# Patient Record
Sex: Male | Born: 1935 | Race: White | Hispanic: No | Smoking: Never smoker
Health system: Southern US, Community
[De-identification: ages and names within clinical notes are randomized; demographics above are authoritative.]

## PROBLEM LIST (undated history)

## (undated) DIAGNOSIS — I119 Hypertensive heart disease without heart failure: Secondary | ICD-10-CM

## (undated) DIAGNOSIS — I252 Old myocardial infarction: Secondary | ICD-10-CM

## (undated) DIAGNOSIS — C61 Malignant neoplasm of prostate: Secondary | ICD-10-CM

## (undated) DIAGNOSIS — I639 Cerebral infarction, unspecified: Secondary | ICD-10-CM

## (undated) DIAGNOSIS — E785 Hyperlipidemia, unspecified: Secondary | ICD-10-CM

## (undated) DIAGNOSIS — I440 Atrioventricular block, first degree: Secondary | ICD-10-CM

## (undated) DIAGNOSIS — I480 Paroxysmal atrial fibrillation: Secondary | ICD-10-CM

## (undated) DIAGNOSIS — M109 Gout, unspecified: Secondary | ICD-10-CM

## (undated) DIAGNOSIS — N183 Chronic kidney disease, stage 3 unspecified: Secondary | ICD-10-CM

## (undated) DIAGNOSIS — R609 Edema, unspecified: Secondary | ICD-10-CM

## (undated) DIAGNOSIS — I2510 Atherosclerotic heart disease of native coronary artery without angina pectoris: Secondary | ICD-10-CM

## (undated) DIAGNOSIS — I10 Essential (primary) hypertension: Secondary | ICD-10-CM

## (undated) DIAGNOSIS — I674 Hypertensive encephalopathy: Secondary | ICD-10-CM

## (undated) DIAGNOSIS — E1165 Type 2 diabetes mellitus with hyperglycemia: Principal | ICD-10-CM

## (undated) DIAGNOSIS — IMO0001 Reserved for inherently not codable concepts without codable children: Secondary | ICD-10-CM

## (undated) DIAGNOSIS — H9319 Tinnitus, unspecified ear: Secondary | ICD-10-CM

## (undated) HISTORY — DX: Old myocardial infarction: I25.2

## (undated) HISTORY — DX: Reserved for inherently not codable concepts without codable children: IMO0001

## (undated) HISTORY — DX: Malignant neoplasm of prostate: C61

## (undated) HISTORY — DX: Type 2 diabetes mellitus with hyperglycemia: E11.65

## (undated) HISTORY — DX: Atrioventricular block, first degree: I44.0

## (undated) HISTORY — DX: Edema, unspecified: R60.9

## (undated) HISTORY — PX: PROSTATE SURGERY: SHX751

## (undated) HISTORY — PX: APPENDECTOMY: SHX54

## (undated) HISTORY — DX: Atherosclerotic heart disease of native coronary artery without angina pectoris: I25.10

## (undated) HISTORY — DX: Hyperlipidemia, unspecified: E78.5

## (undated) HISTORY — DX: Hypertensive encephalopathy: I67.4

## (undated) HISTORY — DX: Essential (primary) hypertension: I10

## (undated) HISTORY — DX: Hypertensive heart disease without heart failure: I11.9

## (undated) HISTORY — DX: Chronic kidney disease, stage 3 (moderate): N18.3

## (undated) HISTORY — DX: Paroxysmal atrial fibrillation: I48.0

## (undated) HISTORY — DX: Chronic kidney disease, stage 3 unspecified: N18.30

## (undated) HISTORY — DX: Tinnitus, unspecified ear: H93.19

## (undated) HISTORY — DX: Gout, unspecified: M10.9

## (undated) HISTORY — DX: Cerebral infarction, unspecified: I63.9

---

## 1993-01-18 HISTORY — PX: CORONARY ARTERY BYPASS GRAFT: SHX141

## 1997-09-25 ENCOUNTER — Emergency Department (HOSPITAL_COMMUNITY): Admission: EM | Admit: 1997-09-25 | Discharge: 1997-09-25 | Payer: Self-pay | Admitting: Emergency Medicine

## 1997-09-25 ENCOUNTER — Encounter: Payer: Self-pay | Admitting: Emergency Medicine

## 1997-09-28 ENCOUNTER — Emergency Department (HOSPITAL_COMMUNITY): Admission: EM | Admit: 1997-09-28 | Discharge: 1997-09-28 | Payer: Self-pay | Admitting: Emergency Medicine

## 2000-05-24 ENCOUNTER — Other Ambulatory Visit: Admission: RE | Admit: 2000-05-24 | Discharge: 2000-05-24 | Payer: Self-pay | Admitting: Urology

## 2000-06-08 ENCOUNTER — Other Ambulatory Visit: Admission: RE | Admit: 2000-06-08 | Discharge: 2000-06-08 | Payer: Self-pay | Admitting: Urology

## 2001-11-08 ENCOUNTER — Encounter: Admission: RE | Admit: 2001-11-08 | Discharge: 2002-02-06 | Payer: Self-pay | Admitting: Internal Medicine

## 2004-04-14 ENCOUNTER — Ambulatory Visit (HOSPITAL_COMMUNITY): Admission: RE | Admit: 2004-04-14 | Discharge: 2004-04-14 | Payer: Self-pay | Admitting: *Deleted

## 2006-02-03 ENCOUNTER — Inpatient Hospital Stay (HOSPITAL_COMMUNITY): Admission: EM | Admit: 2006-02-03 | Discharge: 2006-02-04 | Payer: Self-pay | Admitting: Emergency Medicine

## 2006-02-03 ENCOUNTER — Ambulatory Visit (HOSPITAL_COMMUNITY): Admission: RE | Admit: 2006-02-03 | Discharge: 2006-02-03 | Payer: Self-pay | Admitting: Internal Medicine

## 2011-03-01 DIAGNOSIS — E785 Hyperlipidemia, unspecified: Secondary | ICD-10-CM | POA: Diagnosis not present

## 2011-05-24 DIAGNOSIS — I10 Essential (primary) hypertension: Secondary | ICD-10-CM | POA: Diagnosis not present

## 2011-05-24 DIAGNOSIS — E785 Hyperlipidemia, unspecified: Secondary | ICD-10-CM | POA: Diagnosis not present

## 2011-09-27 DIAGNOSIS — E1149 Type 2 diabetes mellitus with other diabetic neurological complication: Secondary | ICD-10-CM | POA: Diagnosis not present

## 2011-09-27 DIAGNOSIS — R609 Edema, unspecified: Secondary | ICD-10-CM | POA: Diagnosis not present

## 2011-09-27 DIAGNOSIS — I10 Essential (primary) hypertension: Secondary | ICD-10-CM | POA: Diagnosis not present

## 2011-09-27 DIAGNOSIS — I2510 Atherosclerotic heart disease of native coronary artery without angina pectoris: Secondary | ICD-10-CM | POA: Diagnosis not present

## 2012-01-19 HISTORY — PX: EYE SURGERY: SHX253

## 2012-01-24 DIAGNOSIS — Z23 Encounter for immunization: Secondary | ICD-10-CM | POA: Diagnosis not present

## 2012-01-28 DIAGNOSIS — E785 Hyperlipidemia, unspecified: Secondary | ICD-10-CM | POA: Diagnosis not present

## 2012-01-28 DIAGNOSIS — M109 Gout, unspecified: Secondary | ICD-10-CM | POA: Diagnosis not present

## 2012-01-28 DIAGNOSIS — I10 Essential (primary) hypertension: Secondary | ICD-10-CM | POA: Diagnosis not present

## 2012-02-01 DIAGNOSIS — E785 Hyperlipidemia, unspecified: Secondary | ICD-10-CM | POA: Diagnosis not present

## 2012-02-01 DIAGNOSIS — I10 Essential (primary) hypertension: Secondary | ICD-10-CM | POA: Diagnosis not present

## 2012-02-01 DIAGNOSIS — M109 Gout, unspecified: Secondary | ICD-10-CM | POA: Diagnosis not present

## 2012-02-10 ENCOUNTER — Encounter: Payer: Self-pay | Admitting: Geriatric Medicine

## 2012-02-10 DIAGNOSIS — I440 Atrioventricular block, first degree: Secondary | ICD-10-CM

## 2012-02-10 DIAGNOSIS — H9319 Tinnitus, unspecified ear: Secondary | ICD-10-CM

## 2012-02-10 DIAGNOSIS — E119 Type 2 diabetes mellitus without complications: Secondary | ICD-10-CM

## 2012-02-10 DIAGNOSIS — I10 Essential (primary) hypertension: Secondary | ICD-10-CM

## 2012-02-10 DIAGNOSIS — I252 Old myocardial infarction: Secondary | ICD-10-CM

## 2012-02-10 DIAGNOSIS — H908 Mixed conductive and sensorineural hearing loss, unspecified: Secondary | ICD-10-CM

## 2012-02-10 DIAGNOSIS — I25812 Atherosclerosis of bypass graft of coronary artery of transplanted heart without angina pectoris: Secondary | ICD-10-CM

## 2012-02-10 DIAGNOSIS — E1165 Type 2 diabetes mellitus with hyperglycemia: Secondary | ICD-10-CM

## 2012-02-10 DIAGNOSIS — R011 Cardiac murmur, unspecified: Secondary | ICD-10-CM

## 2012-02-10 DIAGNOSIS — E785 Hyperlipidemia, unspecified: Secondary | ICD-10-CM

## 2012-02-10 DIAGNOSIS — M109 Gout, unspecified: Secondary | ICD-10-CM

## 2012-02-10 DIAGNOSIS — C61 Malignant neoplasm of prostate: Secondary | ICD-10-CM

## 2012-02-10 DIAGNOSIS — I2510 Atherosclerotic heart disease of native coronary artery without angina pectoris: Secondary | ICD-10-CM

## 2012-02-11 ENCOUNTER — Encounter: Payer: Self-pay | Admitting: Geriatric Medicine

## 2012-04-03 DIAGNOSIS — H02009 Unspecified entropion of unspecified eye, unspecified eyelid: Secondary | ICD-10-CM | POA: Diagnosis not present

## 2012-04-03 DIAGNOSIS — E119 Type 2 diabetes mellitus without complications: Secondary | ICD-10-CM | POA: Diagnosis not present

## 2012-04-03 DIAGNOSIS — H179 Unspecified corneal scar and opacity: Secondary | ICD-10-CM | POA: Diagnosis not present

## 2012-04-03 DIAGNOSIS — Z961 Presence of intraocular lens: Secondary | ICD-10-CM | POA: Diagnosis not present

## 2012-04-07 DIAGNOSIS — H02009 Unspecified entropion of unspecified eye, unspecified eyelid: Secondary | ICD-10-CM | POA: Diagnosis not present

## 2012-04-11 ENCOUNTER — Ambulatory Visit: Payer: Self-pay | Admitting: Internal Medicine

## 2012-04-12 ENCOUNTER — Other Ambulatory Visit: Payer: Self-pay | Admitting: Internal Medicine

## 2012-04-24 ENCOUNTER — Telehealth: Payer: Self-pay | Admitting: Nurse Practitioner

## 2012-04-26 ENCOUNTER — Encounter: Payer: Self-pay | Admitting: Internal Medicine

## 2012-04-26 ENCOUNTER — Ambulatory Visit (INDEPENDENT_AMBULATORY_CARE_PROVIDER_SITE_OTHER): Payer: Medicare Other | Admitting: Internal Medicine

## 2012-04-26 VITALS — BP 126/72 | HR 52 | Temp 98.2°F | Resp 15 | Ht 73.0 in | Wt 222.6 lb

## 2012-04-26 DIAGNOSIS — E119 Type 2 diabetes mellitus without complications: Secondary | ICD-10-CM | POA: Diagnosis not present

## 2012-04-26 DIAGNOSIS — E785 Hyperlipidemia, unspecified: Secondary | ICD-10-CM

## 2012-04-26 DIAGNOSIS — I10 Essential (primary) hypertension: Secondary | ICD-10-CM | POA: Diagnosis not present

## 2012-04-26 MED ORDER — INSULIN ASPART 100 UNIT/ML ~~LOC~~ SOLN: 8.0000 [IU] | Freq: Two times a day (BID) | SUBCUTANEOUS | Status: DC

## 2012-04-26 NOTE — Patient Instructions (Addendum)
Check blood sugar before breakfast, before lunch and before dinner until next office visit. Give novolog before lunch and dinner 8 units only if blood sugar is more than 150. Get your blood test done 1 week prior to next office visit which is in 6 weeks Please bring your blood pressure machine on next offcie visit

## 2012-04-27 ENCOUNTER — Other Ambulatory Visit: Payer: Self-pay | Admitting: *Deleted

## 2012-04-27 NOTE — Progress Notes (Signed)
  Subjective:    Patient ID: John Romero, male    DOB: April 15, 1930, 79 y.o.   MRN: [id number]  HPI  Reviewed home blood pressure SBP 140-165 and DBP 69-86. Home cbg 64-80 in am. No recent gout attack or flare up. No premeal sugar monitor. Reviewed home medication. Denies any complaints  Review of Systems  Constitutional: Negative for fever, chills, activity change and appetite change.  HENT: Negative for congestion.   Eyes: Negative for visual disturbance.  Respiratory: Negative for shortness of breath.   Cardiovascular: Negative for chest pain, palpitations and leg swelling.  Gastrointestinal: Negative for abdominal pain.  Endocrine: Negative for polydipsia, polyphagia and polyuria.  Genitourinary: Negative for dysuria.  Musculoskeletal: Negative for arthralgias.  Neurological: Negative for dizziness and weakness.  Hematological: Negative for adenopathy.  Psychiatric/Behavioral: Negative for behavioral problems and agitation.       Objective:   Physical Exam  Constitutional: He is oriented to person, place, and time. He appears well-developed. No distress.  HENT:  Head: Normocephalic and atraumatic.  Mouth/Throat: Oropharynx is clear and moist.  Eyes: Pupils are equal, round, and reactive to light.  Neck: Normal range of motion. Neck supple. No thyromegaly present.  Cardiovascular: Normal rate and regular rhythm.   Pulmonary/Chest: Effort normal and breath sounds normal.  Abdominal: Soft. Bowel sounds are normal.  Musculoskeletal: Normal range of motion. He exhibits no edema and no tenderness.  Neurological: He is alert and oriented to person, place, and time.  Skin: Skin is warm and dry. He is not diaphoretic.  Psychiatric: He has a normal mood and affect.  restless   BP 126/72  Pulse 52  Temp(Src) 98.2 F (36.8 C) (Oral)  Resp 15  Ht 6\' 1"  (1.854 m)  Wt 222 lb 9.6 oz (100.971 kg)  BMI 29.37 kg/m2       Assessment & Plan:   Hypertension- bp remains stable,  continue current bp medication and monitor blood pressure. Check bmp  Diabetes mellitus- will need to check a1c. Pt checking blood sugar only once at home. Will change this to three times a day before meal and decrease novolog to 8 u pre lunch and dinner for cbg > 150 only and continue levemir 27 u daily for now. To bring cbg log next visit. Continue asa, ACEI and metformin with statin  Gout- no recent flare up. Continue current regimen of allopurinol  Hyperlipidemia- continue simvastatin and check flp prior to next visit  Walking for exercise encouraged.

## 2012-05-18 ENCOUNTER — Other Ambulatory Visit: Payer: Medicare Other

## 2012-05-18 DIAGNOSIS — E119 Type 2 diabetes mellitus without complications: Secondary | ICD-10-CM

## 2012-05-18 DIAGNOSIS — I10 Essential (primary) hypertension: Secondary | ICD-10-CM | POA: Diagnosis not present

## 2012-05-19 LAB — HEMOGLOBIN A1C
Est. average glucose Bld gHb Est-mCnc: 169 mg/dL
Hgb A1c MFr Bld: 7.5 % — ABNORMAL HIGH (ref 4.8–5.6)

## 2012-05-19 LAB — BASIC METABOLIC PANEL
CO2: 25 mmol/L (ref 19–28)
Calcium: 9.8 mg/dL (ref 8.6–10.2)
Creatinine, Ser: 1.27 mg/dL (ref 0.76–1.27)
GFR calc non Af Amer: 53 mL/min/{1.73_m2} — ABNORMAL LOW (ref >59)
Glucose: 94 mg/dL (ref 65–99)
Potassium: 3.7 mmol/L (ref 3.5–5.2)

## 2012-05-22 ENCOUNTER — Other Ambulatory Visit: Payer: Medicare Other

## 2012-05-30 ENCOUNTER — Encounter: Payer: Self-pay | Admitting: *Deleted

## 2012-05-31 ENCOUNTER — Ambulatory Visit (INDEPENDENT_AMBULATORY_CARE_PROVIDER_SITE_OTHER): Payer: Medicare Other | Admitting: Internal Medicine

## 2012-05-31 ENCOUNTER — Encounter: Payer: Self-pay | Admitting: Internal Medicine

## 2012-05-31 VITALS — BP 144/82 | HR 51 | Temp 97.4°F | Resp 14 | Ht >= 80 in | Wt 215.4 lb

## 2012-05-31 DIAGNOSIS — I10 Essential (primary) hypertension: Secondary | ICD-10-CM | POA: Diagnosis not present

## 2012-05-31 DIAGNOSIS — E119 Type 2 diabetes mellitus without complications: Secondary | ICD-10-CM | POA: Diagnosis not present

## 2012-05-31 DIAGNOSIS — M109 Gout, unspecified: Secondary | ICD-10-CM | POA: Insufficient documentation

## 2012-05-31 DIAGNOSIS — E785 Hyperlipidemia, unspecified: Secondary | ICD-10-CM | POA: Diagnosis not present

## 2012-05-31 NOTE — Progress Notes (Signed)
  Subjective:    Patient ID: John Romero, male    DOB: 1930/10/20, 79 y.o.   MRN: [id number]  HPI  Patient here with his wife. His labs were reviewed and discussed with patient. Reviewed home cbg reading 80-223. Has not needed to use novolog recently with premeal sugar being less than 150. His bp machine was checked bp reading at home sbp 124-147/ 77-87. bp on arrival to office 144/82. Does not exercise with limitations from his knee pain. Appetite is good. Mostly eats from outside. 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  HENT: Negative for congestion and mouth sores.   Eyes: Negative for visual disturbance.  Respiratory: Negative for shortness of breath.   Cardiovascular: Negative for chest pain.  Gastrointestinal: Negative for abdominal pain and abdominal distention.  Endocrine: Negative for cold intolerance.  Genitourinary: Negative for dysuria.  Musculoskeletal: Positive for arthralgias and gait problem. Negative for joint swelling.  Neurological: Negative for dizziness, syncope and weakness.  Psychiatric/Behavioral: Negative for confusion, dysphoric mood and agitation.       Objective:   Physical Exam  Constitutional: He is oriented to person, place, and time. He appears well-developed and well-nourished. No distress.  HENT:  Head: Normocephalic and atraumatic.  Mouth/Throat: Oropharynx is clear and moist.  Eyes: Pupils are equal, round, and reactive to light.  Neck: Normal range of motion. Neck supple.  Cardiovascular: Normal rate and regular rhythm.   Pulmonary/Chest: Effort normal and breath sounds normal.  Abdominal: Soft.  Musculoskeletal: Normal range of motion.  Flexion and extension limited at knee joint, limping gait on walking, no assistive device used  Neurological: He is alert and oriented to person, place, and time.  Skin: Skin is warm and dry. He is not diaphoretic.   Psychiatric: He has a normal mood and affect.   BP 144/82  Pulse 51  Temp(Src) 97.4 F (36.3 C) (Oral)  Resp 14  Ht 6\' 11"  (2.108 m)  Wt 215 lb 6.4 oz (97.705 kg)  BMI 21.99 kg/m2  bp repeat in office 128/67 and same reading with his home bp machine      Assessment & Plan:   HTN- bp well controlled. Continue current regimen. Check bmp prior to next viist  CKD stage 3- likely from long standing htn and dm. Avoid nsaid. Keep bp and dm under control  Dm- reviewed recent a1c 7.5 and home cbg readings. Continue current regimen. Monitor cbg Check a1c and urine microalbumin prior to next visit. Continue acei and statin. counselled about dietary modifications. Pt understands risks and benefits but does not want any dietary changes.  Gout- no recent flare up, continue allopurinol

## 2012-08-31 ENCOUNTER — Other Ambulatory Visit (HOSPITAL_BASED_OUTPATIENT_CLINIC_OR_DEPARTMENT_OTHER): Payer: Self-pay | Admitting: Internal Medicine

## 2012-09-14 ENCOUNTER — Other Ambulatory Visit: Payer: Self-pay | Admitting: Internal Medicine

## 2012-10-25 ENCOUNTER — Ambulatory Visit: Payer: Medicare Other | Admitting: Internal Medicine

## 2012-10-26 DIAGNOSIS — Z23 Encounter for immunization: Secondary | ICD-10-CM | POA: Diagnosis not present

## 2012-10-28 ENCOUNTER — Other Ambulatory Visit (HOSPITAL_BASED_OUTPATIENT_CLINIC_OR_DEPARTMENT_OTHER): Payer: Self-pay | Admitting: Internal Medicine

## 2012-11-07 ENCOUNTER — Encounter: Payer: Self-pay | Admitting: Internal Medicine

## 2012-11-07 ENCOUNTER — Ambulatory Visit (INDEPENDENT_AMBULATORY_CARE_PROVIDER_SITE_OTHER): Payer: Medicare Other | Admitting: Internal Medicine

## 2012-11-07 ENCOUNTER — Ambulatory Visit: Payer: Medicare Other | Admitting: Internal Medicine

## 2012-11-07 VITALS — BP 128/74 | HR 64 | Ht 71.0 in | Wt 208.0 lb

## 2012-11-07 DIAGNOSIS — R21 Rash and other nonspecific skin eruption: Secondary | ICD-10-CM

## 2012-11-07 DIAGNOSIS — N189 Chronic kidney disease, unspecified: Secondary | ICD-10-CM

## 2012-11-07 DIAGNOSIS — E119 Type 2 diabetes mellitus without complications: Secondary | ICD-10-CM | POA: Diagnosis not present

## 2012-11-07 DIAGNOSIS — N183 Chronic kidney disease, stage 3 unspecified: Secondary | ICD-10-CM

## 2012-11-07 DIAGNOSIS — I129 Hypertensive chronic kidney disease with stage 1 through stage 4 chronic kidney disease, or unspecified chronic kidney disease: Secondary | ICD-10-CM

## 2012-11-07 DIAGNOSIS — M109 Gout, unspecified: Secondary | ICD-10-CM | POA: Diagnosis not present

## 2012-11-07 DIAGNOSIS — E785 Hyperlipidemia, unspecified: Secondary | ICD-10-CM | POA: Diagnosis not present

## 2012-11-07 MED ORDER — NYSTATIN 100000 UNIT/GM EX CREA: 1.0000 "application " | TOPICAL_CREAM | Freq: Two times a day (BID) | CUTANEOUS | Status: DC

## 2012-11-07 MED ORDER — INSULIN ASPART 100 UNIT/ML ~~LOC~~ SOLN: 8.0000 [IU] | Freq: Two times a day (BID) | SUBCUTANEOUS | Status: DC

## 2012-11-07 MED ORDER — METOPROLOL TARTRATE 50 MG PO TABS: ORAL_TABLET | ORAL | Status: DC

## 2012-11-07 NOTE — Patient Instructions (Signed)
Take your aspart 8 units twice a day for sugar more than 200 only. Once your blood test results are out, we will call you

## 2012-11-07 NOTE — Progress Notes (Signed)
Patient ID: John Romero, male   DOB: Jan 09, 1931, 79 y.o.   MRN: [id number]  Chief Complaint  Patient presents with  . Medical Managment of Chronic Issues    Follow-up    No Known Allergies  HPI Patient here for his routine visit. He has been checking his sugar at ome and readings have been mostly below 200. He has not done his blood work prior to the visit. He denies any complaints. He mentions taking his medication. When asked about routine stuffs, he is tearful and mentions that his wife passed away in October 27, 2022. i had seen her with the patient last visit with his wife. His labs were reviewed and discussed with patient. Reviewed home cbg reading 80-223. Has not needed to use novolog recently with premeal sugar being less than 150. Appetite is good. Mostly eats from outside.   Review of Systems  Constitutional: Negative for fever, appetite change and fatigue.  HENT: Negative for congestion and mouth sores.   Eyes: Negative for visual disturbance.  Respiratory: Negative for shortness of breath.   Cardiovascular: Negative for chest pain.  Gastrointestinal: Negative for abdominal pain and abdominal distention.  Endocrine: Negative for cold intolerance.  Genitourinary: Negative for dysuria.  Musculoskeletal: Positive for arthralgias and gait problem. Negative for joint swelling.  Neurological: Negative for dizziness, syncope and weakness.  Psychiatric/Behavioral: Negative for confusion, dysphoric mood and agitation. Tearful this visit  Reviewed PMH, PSH and medication list.   Physical exam BP 128/74  Pulse 64  Ht 5\' 11"  (1.803 m)  Wt 208 lb (94.348 kg)  BMI 29.02 kg/m2  Constitutional: He is oriented to person, place, and time. He appears well-developed and well-nourished. No distress.  HENT:   Head: Normocephalic and atraumatic.   Mouth/Throat: Oropharynx is clear and moist.  Eyes: Pupils are equal, round, and reactive to light.  Neck: Normal range of motion. Neck supple.   Cardiovascular: Normal rate and regular rhythm.   Pulmonary/Chest: Effort normal and breath sounds normal.  Abdominal: Soft.  Musculoskeletal: Normal range of motion.  Flexion and extension limited at knee joint, limping gait on walking, no assistive device used  Neurological: He is alert and oriented to person, place, and time.  Skin: Skin is warm and dry. He is not diaphoretic. Has erythematous rash with white scale on the crease of his buttock. No drainage or skin tear Psychiatric: He has flat affect, tearful  Assessment/plan  HTN- bp well controlled. Continue current regimen. Check bmp today  Dm- reviewed last a1c 7.5 and home cbg readings. Continue current regimen of levemir, novolog and metformin. Monitor cbg. Check a1c and urine microalbumin today. Continue acei and statin. counselled about dietary modifications.   Rash- in the buttock area. Concern for candidal rash given the white coating an dhis dm. Will have him on nystatin cream for now and reassess  Hyperlipidemia- continue zocor for now. Monitor lipid  Gout- no recent flare up, continue allopurinol and check uric acid level  CKD stage 3- likely from long standing htn and dm. Avoid nsaid. Keep bp and dm under control

## 2012-11-08 LAB — COMPREHENSIVE METABOLIC PANEL
Albumin: 4.8 g/dL — ABNORMAL HIGH (ref 3.5–4.7)
BUN: 20 mg/dL (ref 8–27)
Chloride: 96 mmol/L — ABNORMAL LOW (ref 97–108)
Creatinine, Ser: 1.18 mg/dL (ref 0.76–1.27)
GFR calc Af Amer: 67 mL/min/{1.73_m2} (ref >59)
GFR calc non Af Amer: 58 mL/min/{1.73_m2} — ABNORMAL LOW (ref >59)
Globulin, Total: 2 g/dL (ref 1.5–4.5)
Glucose: 135 mg/dL — ABNORMAL HIGH (ref 65–99)
Total Bilirubin: 0.4 mg/dL (ref 0.0–1.2)
Total Protein: 6.8 g/dL (ref 6.0–8.5)

## 2012-11-08 LAB — LIPID PANEL
Chol/HDL Ratio: 3.3 ratio units (ref 0.0–5.0)
Cholesterol, Total: 190 mg/dL (ref 100–199)
LDL Calculated: 93 mg/dL (ref 0–99)
VLDL Cholesterol Cal: 39 mg/dL (ref 5–40)

## 2012-11-08 LAB — HEMOGLOBIN A1C: Est. average glucose Bld gHb Est-mCnc: 200 mg/dL

## 2012-11-08 LAB — MICROALBUMIN / CREATININE URINE RATIO: MICROALB/CREAT RATIO: 35.1 mg/g creat — ABNORMAL HIGH (ref 0.0–30.0)

## 2012-11-22 ENCOUNTER — Ambulatory Visit: Payer: Self-pay | Admitting: Internal Medicine

## 2012-12-19 ENCOUNTER — Ambulatory Visit (INDEPENDENT_AMBULATORY_CARE_PROVIDER_SITE_OTHER): Payer: Medicare Other | Admitting: Internal Medicine

## 2012-12-19 ENCOUNTER — Encounter: Payer: Self-pay | Admitting: Internal Medicine

## 2012-12-19 ENCOUNTER — Other Ambulatory Visit: Payer: Self-pay | Admitting: Internal Medicine

## 2012-12-19 VITALS — BP 148/76 | HR 59 | Temp 98.0°F | Wt 212.0 lb

## 2012-12-19 DIAGNOSIS — L8992 Pressure ulcer of unspecified site, stage 2: Secondary | ICD-10-CM

## 2012-12-19 DIAGNOSIS — E1129 Type 2 diabetes mellitus with other diabetic kidney complication: Secondary | ICD-10-CM | POA: Diagnosis not present

## 2012-12-19 DIAGNOSIS — M109 Gout, unspecified: Secondary | ICD-10-CM

## 2012-12-19 DIAGNOSIS — N189 Chronic kidney disease, unspecified: Secondary | ICD-10-CM | POA: Diagnosis not present

## 2012-12-19 DIAGNOSIS — I129 Hypertensive chronic kidney disease with stage 1 through stage 4 chronic kidney disease, or unspecified chronic kidney disease: Secondary | ICD-10-CM | POA: Diagnosis not present

## 2012-12-19 DIAGNOSIS — L89109 Pressure ulcer of unspecified part of back, unspecified stage: Secondary | ICD-10-CM

## 2012-12-19 DIAGNOSIS — N058 Unspecified nephritic syndrome with other morphologic changes: Secondary | ICD-10-CM

## 2012-12-19 DIAGNOSIS — E785 Hyperlipidemia, unspecified: Secondary | ICD-10-CM | POA: Diagnosis not present

## 2012-12-19 DIAGNOSIS — L89152 Pressure ulcer of sacral region, stage 2: Secondary | ICD-10-CM

## 2012-12-19 MED ORDER — CEPHALEXIN 500 MG PO CAPS: 500.0000 mg | ORAL_CAPSULE | Freq: Two times a day (BID) | ORAL | Status: DC

## 2012-12-19 MED ORDER — METFORMIN HCL 500 MG PO TABS: 500.0000 mg | ORAL_TABLET | Freq: Two times a day (BID) | ORAL | Status: DC

## 2012-12-19 MED ORDER — DERMAGRAFT EX SHEE: 1.0000 | MEDICATED_PATCH | CUTANEOUS | Status: DC

## 2012-12-19 NOTE — Progress Notes (Signed)
Patient ID: John Romero, male   DOB: [birth year], 79 y.o.   MRN: [id number]  Chief Complaint  Patient presents with  . Medical Managment of Chronic Issues    3 month follow-up    HPI Patient here for his routine visit. Reviewed his home sugar readings and his a1c. No hypoglycemia No gout attacks bp well controlled cbg at home fasting mostly 90-150 and post prandial 150-240 Last a1c 8.6 from 7.5 a month back He has noted soreness in his back. He applied nystatin cream to his buttock area with no imporvement Denies any drainage  Review of Systems   Constitutional: Negative for fever, appetite change and fatigue.   HENT: Negative for congestion and mouth sores.    Eyes: Negative for visual disturbance.   Respiratory: Negative for shortness of breath.    Cardiovascular: Negative for chest pain.   Gastrointestinal: Negative for abdominal pain and abdominal distention.   Endocrine: Negative for cold intolerance.   Genitourinary: Negative for dysuria.   Musculoskeletal: Positive for arthralgias. Negative for joint swelling.   Neurological: Negative for dizziness, syncope and weakness.   Psychiatric/Behavioral: Negative for confusion, dysphoric mood and agitation.   Physical exam BP 148/76  Pulse 59  Temp(Src) 98 F (36.7 C) (Oral)  Wt 212 lb (96.163 kg)  SpO2 98%  Constitutional: He is oriented to person, place, and time. He appears well-developed and well-nourished. No distress.   HENT:   Head: Normocephalic and atraumatic.   Mouth/Throat: Oropharynx is clear and moist.   Eyes: Pupils are equal, round, and reactive to light.   Neck: Normal range of motion. Neck supple.   Cardiovascular: Normal rate and regular rhythm.    Pulmonary/Chest: Effort normal and breath sounds normal.   Abdominal: Soft.  Musculoskeletal: Normal range of motion.  Neurological: He is alert and oriented to person, place, and time.   Skin: Stage 2 pressure ulcer in his buttock. Tenderness noted. He is not  diaphoretic. No drainage Psychiatric: normal mood and affect  Labs Lab Results  Component Value Date   HGBA1C 8.6* 11/07/2012   CMP     Component Value Date/Time   NA 141 11/07/2012 1452   K 3.8 11/07/2012 1452   CL 96* 11/07/2012 1452   CO2 26 11/07/2012 1452   GLUCOSE 135* 11/07/2012 1452   BUN 20 11/07/2012 1452   CREATININE 1.18 11/07/2012 1452   CALCIUM 9.9 11/07/2012 1452   PROT 6.8 11/07/2012 1452   AST 23 11/07/2012 1452   ALT 18 11/07/2012 1452   ALKPHOS 63 11/07/2012 1452   BILITOT 0.4 11/07/2012 1452   GFRNONAA 58* 11/07/2012 1452   GFRAA 67 11/07/2012 1452    Assessment/plan  Dm type 2- Change metformin to 500 mg bid and  Continue levemir 27 u daily with novolog 8 u for cbg > 200 for now. Goal a1c < 7.5 . Continue statin and ACEI  HTN- bp well controlled. Continue current regimen  Stage 2 pressure ulcer in sacrum-With erythema and tenderness. Will provide keflex 500 mg bid for a week with hydrocolloid dressing to the bottom area and use of wedge cushion to help take pressure off. Skin care as advised to clean with soap and water, avoid scrubbing/ denuding the area  Hyperlipidemia- continue zocor for now. Monitor lipid  Gout- no recent flare up, continue allopurinol. Normal uric acid level  CKD stage 3- from long standing htn and dm. Avoid nsaid. Keep bp and dm under control

## 2012-12-19 NOTE — Patient Instructions (Signed)
1) take your antibiotic twice a day for 1 week  2) apply the dressing to the buttock area after cleaning with soap and water but do not scrub the area  3) take metformin half a pill twice a day which is 500 mg twice a day

## 2012-12-21 ENCOUNTER — Other Ambulatory Visit: Payer: Self-pay | Admitting: *Deleted

## 2012-12-21 MED ORDER — DERMAGRAFT EX SHEE: 1.0000 | MEDICATED_PATCH | CUTANEOUS | Status: DC

## 2012-12-22 ENCOUNTER — Telehealth: Payer: Self-pay

## 2012-12-22 NOTE — Telephone Encounter (Signed)
Patient called [facility] they did not get the Rx for the Derma graft, Synetta Fail had faxed it 12/21/12. Called [facility], no they don't have Rx for Derma graft, it is an over the counter, they don't have it call [facility], they don't have it call [facility] they don't have it. Check with Claudette it is like a DuoDerm, called [facility], they don't have anything like that. [facility] they do have it. Called patient, they are located at [address] open until 5:30 today but open Saturday 9-12.

## 2013-01-03 ENCOUNTER — Encounter: Payer: Self-pay | Admitting: Internal Medicine

## 2013-01-03 ENCOUNTER — Ambulatory Visit (INDEPENDENT_AMBULATORY_CARE_PROVIDER_SITE_OTHER): Payer: Medicare Other | Admitting: Internal Medicine

## 2013-01-03 VITALS — BP 160/78 | HR 58 | Temp 97.2°F | Wt 212.0 lb

## 2013-01-03 DIAGNOSIS — I10 Essential (primary) hypertension: Secondary | ICD-10-CM | POA: Diagnosis not present

## 2013-01-03 DIAGNOSIS — E1129 Type 2 diabetes mellitus with other diabetic kidney complication: Secondary | ICD-10-CM

## 2013-01-03 DIAGNOSIS — L89109 Pressure ulcer of unspecified part of back, unspecified stage: Secondary | ICD-10-CM | POA: Diagnosis not present

## 2013-01-03 DIAGNOSIS — N058 Unspecified nephritic syndrome with other morphologic changes: Secondary | ICD-10-CM

## 2013-01-03 DIAGNOSIS — L89152 Pressure ulcer of sacral region, stage 2: Secondary | ICD-10-CM

## 2013-01-03 DIAGNOSIS — L8992 Pressure ulcer of unspecified site, stage 2: Secondary | ICD-10-CM

## 2013-01-03 MED ORDER — INSULIN DETEMIR 100 UNIT/ML ~~LOC~~ SOLN: 30.0000 [IU] | Freq: Every day | SUBCUTANEOUS | Status: DC

## 2013-01-03 NOTE — Progress Notes (Signed)
Patient ID: John Romero, male   DOB: [birth year], 79 y.o.   MRN: [id number]  Chief Complaint  Patient presents with  . Follow-up    sacral wound  . Hyperglycemia   No Known Allergies  HPI 79 y/o male pt is here for follow up. His blood sugar has been running high with decrease in his metformin with fasting cbg up by 50 points in around 178 and post meals 300 30-50 points higher than before No change of appetite Using wedge cushion and completed course of antibiotics. Pain is decreased but discomfort present. No fever or chills  Review of Systems  Constitutional: Negative for fever, chills, weight loss, malaise/fatigue and diaphoresis.  HENT: Negative for congestion, hearing loss and sore throat.   Respiratory: Negative for cough, sputum production, shortness of breath and wheezing.   Cardiovascular: Negative for chest pain, palpitations, orthopnea and leg swelling.  Gastrointestinal: Negative for heartburn, nausea, vomiting, abdominal pain, diarrhea and constipation.  Genitourinary: Negative for dysuria, urgency, frequency and flank pain.  Musculoskeletal: Negative for falls Skin: Negative for itching  Neurological: Negative for dizziness, tingling, focal weakness and headaches.  Psychiatric/Behavioral: Negative for depression and memory loss. The patient is not nervous/anxious.    Medication reviewed. See MAR  BP 160/78  Pulse 58  Temp(Src) 97.2 F (36.2 C) (Oral)  Wt 212 lb (96.163 kg)  SpO2 97%  General- elderly male in no acute distress Head- atraumatic, normocephalic Eyes- PERRLA, EOMI, no pallor, no icterus Neck- no lymphadenopathy, no thyromegaly, no jugular vein distension, no carotid bruit Chest- no chest wall deformities, no chest wall tenderness Cardiovascular- normal s1,s2, no murmurs/ rubs/ gallops Respiratory- bilateral clear to auscultation, no wheeze, no rhonchi, no crackles Abdomen- bowel sounds present, soft, non tender, no organomegaly, no abdominal  bruits, no guarding or rigidity, no CVA tenderness Skin- stage 2 pressure ulcer in the sacral area, erythema present but improved from before, no drainage, no signs of infection Musculoskeletal- able to move all 4 extremities, no spinal and paraspinal tenderness Neurological- no focal deficit Psychiatry- alert and oriented to person, place and time, normal mood and affect    Lab Results  Component Value Date   HGBA1C 8.6* 11/07/2012   CMP     Component Value Date/Time   NA 141 11/07/2012 1452   K 3.8 11/07/2012 1452   CL 96* 11/07/2012 1452   CO2 26 11/07/2012 1452   GLUCOSE 135* 11/07/2012 1452   BUN 20 11/07/2012 1452   CREATININE 1.18 11/07/2012 1452   CALCIUM 9.9 11/07/2012 1452   PROT 6.8 11/07/2012 1452   AST 23 11/07/2012 1452   ALT 18 11/07/2012 1452   ALKPHOS 63 11/07/2012 1452   BILITOT 0.4 11/07/2012 1452   GFRNONAA 58* 11/07/2012 1452   GFRAA 67 11/07/2012 1452    Assessment/plan  1. Unspecified essential hypertension Continue current bp medications  2. DM type 2, uncontrolled, with renal complications Last a1c 8.6 from 7.5 and has elevated cbg at home. Will increase his levemir to 30 u for now, continue current dose SSI novolog and metformin. Pt to record cbg readings. Continue ACEI and statin - Hemoglobin A1c; Future  3. Pressure ulcer of coccygeal region, stage 2 No further signs of infection but stage 2 pressure ulcer present. Continue wedge cushion for ulcer prophylaxis and to take pressure off.  - AMB referral to wound care center

## 2013-01-05 ENCOUNTER — Other Ambulatory Visit: Payer: Self-pay | Admitting: *Deleted

## 2013-01-05 ENCOUNTER — Other Ambulatory Visit: Payer: Self-pay | Admitting: Internal Medicine

## 2013-01-05 MED ORDER — INSULIN DETEMIR 100 UNIT/ML FLEXPEN: PEN_INJECTOR | SUBCUTANEOUS | Status: DC

## 2013-01-06 ENCOUNTER — Other Ambulatory Visit: Payer: Self-pay | Admitting: Internal Medicine

## 2013-01-17 ENCOUNTER — Other Ambulatory Visit (HOSPITAL_BASED_OUTPATIENT_CLINIC_OR_DEPARTMENT_OTHER): Payer: Self-pay | Admitting: Internal Medicine

## 2013-01-19 ENCOUNTER — Other Ambulatory Visit: Payer: Self-pay | Admitting: *Deleted

## 2013-01-19 MED ORDER — INSULIN DETEMIR 100 UNIT/ML FLEXPEN: PEN_INJECTOR | SUBCUTANEOUS | Status: DC

## 2013-01-31 ENCOUNTER — Ambulatory Visit: Payer: Self-pay | Admitting: Internal Medicine

## 2013-02-01 ENCOUNTER — Encounter (HOSPITAL_BASED_OUTPATIENT_CLINIC_OR_DEPARTMENT_OTHER): Payer: Medicare Other | Attending: Internal Medicine

## 2013-02-18 ENCOUNTER — Other Ambulatory Visit: Payer: Self-pay | Admitting: Internal Medicine

## 2013-03-01 ENCOUNTER — Other Ambulatory Visit: Payer: Self-pay | Admitting: *Deleted

## 2013-03-01 MED ORDER — GLUCOSE BLOOD VI STRP: ORAL_STRIP | Status: DC

## 2013-03-02 ENCOUNTER — Other Ambulatory Visit: Payer: Self-pay | Admitting: *Deleted

## 2013-03-06 ENCOUNTER — Other Ambulatory Visit: Payer: Medicare Other

## 2013-03-07 ENCOUNTER — Encounter: Payer: Self-pay | Admitting: Internal Medicine

## 2013-03-07 ENCOUNTER — Ambulatory Visit (INDEPENDENT_AMBULATORY_CARE_PROVIDER_SITE_OTHER): Payer: Medicare Other | Admitting: Internal Medicine

## 2013-03-07 VITALS — BP 148/78 | HR 59 | Temp 97.5°F | Wt 215.0 lb

## 2013-03-07 DIAGNOSIS — I498 Other specified cardiac arrhythmias: Secondary | ICD-10-CM | POA: Diagnosis not present

## 2013-03-07 DIAGNOSIS — L8992 Pressure ulcer of unspecified site, stage 2: Secondary | ICD-10-CM

## 2013-03-07 DIAGNOSIS — I10 Essential (primary) hypertension: Secondary | ICD-10-CM | POA: Diagnosis not present

## 2013-03-07 DIAGNOSIS — R001 Bradycardia, unspecified: Secondary | ICD-10-CM | POA: Insufficient documentation

## 2013-03-07 DIAGNOSIS — L89109 Pressure ulcer of unspecified part of back, unspecified stage: Secondary | ICD-10-CM | POA: Diagnosis not present

## 2013-03-07 DIAGNOSIS — E119 Type 2 diabetes mellitus without complications: Secondary | ICD-10-CM | POA: Diagnosis not present

## 2013-03-07 DIAGNOSIS — L89152 Pressure ulcer of sacral region, stage 2: Secondary | ICD-10-CM

## 2013-03-07 MED ORDER — METOPROLOL TARTRATE 25 MG PO TABS: ORAL_TABLET | ORAL | Status: DC

## 2013-03-07 MED ORDER — INSULIN DETEMIR 100 UNIT/ML FLEXPEN: PEN_INJECTOR | SUBCUTANEOUS | Status: DC

## 2013-03-07 NOTE — Progress Notes (Signed)
Patient ID: John Romero, male   DOB: [birth year], 80 y.o.   MRN: [id number]    Chief Complaint  Patient presents with  . Medical Managment of Chronic Issues    dm, sacral wound    No Known Allergies  HPI 80 y/o male patient is here for follow up. cbg mostly 130- 280 wit few reading 70-80 ad few in 300s. On levemir 30 u and SSI for now and metformin Missed his wound care appointment. Mentions he feels the wound is healing and much better for now No other complaints complaint with his medication  Review of Systems  Constitutional: Negative for fever, chills, weight loss, malaise/fatigue and diaphoresis.  HENT: Negative for congestion, hearing loss and sore throat.   Respiratory: Negative for cough, sputum production, shortness of breath and wheezing.   Cardiovascular: Negative for chest pain, palpitations, orthopnea and leg swelling.  Gastrointestinal: Negative for heartburn, nausea, vomiting, abdominal pain, diarrhea and constipation.  Genitourinary: Negative for dysuria, urgency, frequency and flank pain.  Musculoskeletal: Negative for falls Skin: Negative for itching  Neurological: Negative for dizziness, tingling, focal weakness and headaches.  Psychiatric/Behavioral: Negative for depression and memory loss. The patient is not nervous/anxious.    Medication reviewed. See [facility]  Past Medical History  Diagnosis Date  . Type II or unspecified type diabetes mellitus without mention of complication, uncontrolled   . Other and unspecified hyperlipidemia   . Gout, unspecified   . Unspecified essential hypertension   . Type II or unspecified type diabetes mellitus with renal manifestations, uncontrolled   . 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 Gout, unspecified   . Type II or unspecified type diabetes mellitus without mention of complication, uncontrolled   . First degree atrioventricular block   . Malignant neoplasm of prostate   . Malignant neoplasm  of prostate   . Type II or unspecified type diabetes mellitus without mention of complication, not stated as uncontrolled   . Other and unspecified hyperlipidemia   . Unspecified tinnitus   . Unspecified essential hypertension   . Old myocardial infarction   . Coronary atherosclerosis of unspecified type of vessel, native or graft   . Edema   . Undiagnosed cardiac murmurs   . Undiagnosed cardiac murmurs    Current Outpatient Prescriptions on File Prior to Visit  Medication Sig Dispense Refill  . allopurinol (ZYLOPRIM) 300 MG tablet TAKE 1 TABLET DAILY TO PREVENT GOUT ATTACK  90 tablet  1  . amLODipine (NORVASC) 10 MG tablet TAKE 1 TABLET DAILY FOR BLOOD PRESSURE  90 tablet  1  . aspirin 325 MG EC tablet Take 325 mg by mouth daily.      . cloNIDine (CATAPRES) 0.3 MG tablet TAKE 1 TABLET TWICE A DAY FOR BLOOD PRESSURE  180 tablet  3  . glucose blood (TRUETEST TEST) test strip Use to test blood sugars twice daily. DX 250.00  100 each  6  . hydrochlorothiazide (HYDRODIURIL) 50 MG tablet Take 50 mg by mouth daily. For blood pressure.      . insulin aspart (NOVOLOG) 100 UNIT/ML injection Inject 8 Units into the skin 2 (two) times daily before a meal. Inject 8 units before lunch and 8 before dinner if blood sugar is more than 200 only. Please do not inject novolog if blood sugar less than 200  1 vial  3  . KLOR-CON M10 10 MEQ tablet TAKE 1 TABLET DAILY FOR POTASSIUM  90 tablet  1  . metFORMIN (GLUCOPHAGE)  500 MG tablet Take 1 tablet (500 mg total) by mouth 2 (two) times daily with a meal.  180 tablet  0  . Multiple Vitamin (MULTIVITAMIN) tablet Take 1 tablet by mouth daily.      . ramipril (ALTACE) 10 MG capsule TAKE 1 CAPSULE TWICE A DAY TO CONTROL BLOOD PRESSURE  180 capsule  2  . simvastatin (ZOCOR) 20 MG tablet Take 20 mg by mouth daily. To lower cholesterol.       No current facility-administered medications on file prior to visit.    Physical exam BP 148/78  Pulse 59  Temp(Src) 97.5 F  (36.4 C) (Oral)  Wt 215 lb (97.523 kg)  SpO2 98%  General- elderly male in no acute distress Head- atraumatic, normocephalic Eyes- PERRLA, EOMI, no pallor, no icterus Neck- no lymphadenopathy, no thyromegaly, no jugular vein distension, no carotid bruit Chest- no chest wall deformities, no chest wall tenderness Cardiovascular- normal s1,s2, no murmurs/ rubs/ gallops Respiratory- bilateral clear to auscultation, no wheeze, no rhonchi, no crackles Abdomen- bowel sounds present, soft, non tender, no organomegaly, no abdominal bruits, no guarding or rigidity, no CVA tenderness Skin- stage 2 pressure ulcer in the sacral area, erythema present but improved from before, no drainage, no signs of infection, skin maceration noted Musculoskeletal- able to move all 4 extremities, no spinal and paraspinal tenderness Neurological- no focal deficit Psychiatry- alert and oriented to person, place and time, normal mood and affect  Labs- CMP     Component Value Date/Time   NA 141 11/07/2012 1452   K 3.8 11/07/2012 1452   CL 96* 11/07/2012 1452   CO2 26 11/07/2012 1452   GLUCOSE 135* 11/07/2012 1452   BUN 20 11/07/2012 1452   CREATININE 1.18 11/07/2012 1452   CALCIUM 9.9 11/07/2012 1452   PROT 6.8 11/07/2012 1452   AST 23 11/07/2012 1452   ALT 18 11/07/2012 1452   ALKPHOS 63 11/07/2012 1452   BILITOT 0.4 11/07/2012 1452   GFRNONAA 58* 11/07/2012 1452   GFRAA 67 11/07/2012 1452    Lab Results  Component Value Date   HGBA1C 8.6* 11/07/2012   Assessment/plan  1. Type II or unspecified type diabetes mellitus without mention of complication, not stated as uncontrolled Continue novolog 8 u bid and levemir 30 u with metformin 500 bid for now. Monitor cbg - Hemoglobin V7C - Basic Metabolic Panel  2. Pressure ulcer of coccygeal region, stage 2 Will make another appointment with wound care. Continue pressure ulcer precautions  3. Unspecified essential hypertension Continue bp medications, no  changes made- amlodipine, hctz, clonidine and ramipril. Lopressor dose reduced  4. Bradycardia Decrease metoprolol to 25 mg bid for now and reassess  Blanchie Serve, MD  [facility] [phone] (Monday-Friday 8 am - 5 pm) [phone] (afterhours)

## 2013-03-08 LAB — BASIC METABOLIC PANEL
BUN/Creatinine Ratio: 19 (ref 10–22)
BUN: 21 mg/dL (ref 8–27)
CALCIUM: 10 mg/dL (ref 8.6–10.2)
CHLORIDE: 98 mmol/L (ref 97–108)
CO2: 24 mmol/L (ref 18–29)
CREATININE: 1.11 mg/dL (ref 0.76–1.27)
GFR calc Af Amer: 72 mL/min/{1.73_m2} (ref >59)
GFR calc non Af Amer: 62 mL/min/{1.73_m2} (ref >59)
Glucose: 181 mg/dL — ABNORMAL HIGH (ref 65–99)
Potassium: 3.8 mmol/L (ref 3.5–5.2)
SODIUM: 142 mmol/L (ref 134–144)

## 2013-03-08 LAB — HEMOGLOBIN A1C
Est. average glucose Bld gHb Est-mCnc: 220 mg/dL
HEMOGLOBIN A1C: 9.3 % — AB (ref 4.8–5.6)

## 2013-03-20 ENCOUNTER — Other Ambulatory Visit: Payer: Medicare Other

## 2013-03-21 ENCOUNTER — Other Ambulatory Visit: Payer: Medicare Other

## 2013-03-21 ENCOUNTER — Encounter (HOSPITAL_BASED_OUTPATIENT_CLINIC_OR_DEPARTMENT_OTHER): Payer: Medicare Other | Attending: General Surgery

## 2013-03-21 DIAGNOSIS — E119 Type 2 diabetes mellitus without complications: Secondary | ICD-10-CM | POA: Insufficient documentation

## 2013-03-21 DIAGNOSIS — Z79899 Other long term (current) drug therapy: Secondary | ICD-10-CM | POA: Diagnosis not present

## 2013-03-21 DIAGNOSIS — I252 Old myocardial infarction: Secondary | ICD-10-CM | POA: Insufficient documentation

## 2013-03-21 DIAGNOSIS — I2510 Atherosclerotic heart disease of native coronary artery without angina pectoris: Secondary | ICD-10-CM | POA: Diagnosis not present

## 2013-03-21 DIAGNOSIS — Z8546 Personal history of malignant neoplasm of prostate: Secondary | ICD-10-CM | POA: Insufficient documentation

## 2013-03-21 DIAGNOSIS — L98499 Non-pressure chronic ulcer of skin of other sites with unspecified severity: Secondary | ICD-10-CM | POA: Diagnosis not present

## 2013-03-21 DIAGNOSIS — M109 Gout, unspecified: Secondary | ICD-10-CM | POA: Diagnosis not present

## 2013-03-21 DIAGNOSIS — Z7982 Long term (current) use of aspirin: Secondary | ICD-10-CM | POA: Diagnosis not present

## 2013-03-21 DIAGNOSIS — Z951 Presence of aortocoronary bypass graft: Secondary | ICD-10-CM | POA: Diagnosis not present

## 2013-03-22 NOTE — Progress Notes (Signed)
[facility]  NAME:  John Romero, MEDLOCK NO.:  [id number]  MEDICAL RECORD NO.:  [id number]      DATE OF BIRTH:  [birth year]  PHYSICIAN:  Judene Companion, M.D.           VISIT DATE:                                  OFFICE VISIT   [name] is an 80 year old gentleman who has had many health issues including myocardial infarction.  He has also had open heart surgery. He has type 2 diabetes.  He has had carcinoma of the prostate.  He has gout.  He is on many medicines including metformin, metoprolol, potassium, ramipril, simvastatin, aspirin, amlodipine, and allopurinol.  He comes to [place] today with a complaint of ulcer in between his buttocks right below his sacrum.  It is only about 2 cm long and 2 or 3 mm wide. He has been treating it with an antifungal cream.  When he came to [place] today, he had a blood pressure of 168/95, respirations 18, pulse 70, temperature of 98.  He weighs 214 pounds.  On looking at this area, he has a red ulcerated area that does not appear to go all the way through the dermis, but is just involving the epidermis and dermis.  It is very red and tender.  It looks to me like it is a fungal dermatitis.  I am going to treat him with nystatin powder and we will put a little piece of silver alginate and keep it dry.  He will return here in a week.  DIAGNOSIS: 1. Probable fungal dermatitis between his buttocks. 2. 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  Other diagnoses is hypertension and gout and history of carcinoma of the prostate.     Judene Companion, M.D.     PP/MEDQ  D:  03/21/2013  T:  03/22/2013  Job:  [id number]

## 2013-03-28 DIAGNOSIS — B49 Unspecified mycosis: Secondary | ICD-10-CM | POA: Diagnosis not present

## 2013-03-28 DIAGNOSIS — L98499 Non-pressure chronic ulcer of skin of other sites with unspecified severity: Secondary | ICD-10-CM | POA: Diagnosis not present

## 2013-03-28 DIAGNOSIS — I252 Old myocardial infarction: Secondary | ICD-10-CM | POA: Diagnosis not present

## 2013-03-28 DIAGNOSIS — I2510 Atherosclerotic heart disease of native coronary artery without angina pectoris: Secondary | ICD-10-CM | POA: Diagnosis not present

## 2013-03-28 DIAGNOSIS — E119 Type 2 diabetes mellitus without complications: Secondary | ICD-10-CM | POA: Diagnosis not present

## 2013-04-04 DIAGNOSIS — E119 Type 2 diabetes mellitus without complications: Secondary | ICD-10-CM | POA: Diagnosis not present

## 2013-04-04 DIAGNOSIS — I252 Old myocardial infarction: Secondary | ICD-10-CM | POA: Diagnosis not present

## 2013-04-04 DIAGNOSIS — I2510 Atherosclerotic heart disease of native coronary artery without angina pectoris: Secondary | ICD-10-CM | POA: Diagnosis not present

## 2013-04-04 DIAGNOSIS — L98499 Non-pressure chronic ulcer of skin of other sites with unspecified severity: Secondary | ICD-10-CM | POA: Diagnosis not present

## 2013-04-11 DIAGNOSIS — E119 Type 2 diabetes mellitus without complications: Secondary | ICD-10-CM | POA: Diagnosis not present

## 2013-04-11 DIAGNOSIS — I2510 Atherosclerotic heart disease of native coronary artery without angina pectoris: Secondary | ICD-10-CM | POA: Diagnosis not present

## 2013-04-11 DIAGNOSIS — I252 Old myocardial infarction: Secondary | ICD-10-CM | POA: Diagnosis not present

## 2013-04-11 DIAGNOSIS — L98499 Non-pressure chronic ulcer of skin of other sites with unspecified severity: Secondary | ICD-10-CM | POA: Diagnosis not present

## 2013-04-16 ENCOUNTER — Other Ambulatory Visit: Payer: Self-pay | Admitting: Internal Medicine

## 2013-04-18 ENCOUNTER — Encounter (HOSPITAL_BASED_OUTPATIENT_CLINIC_OR_DEPARTMENT_OTHER): Payer: Medicare Other | Attending: General Surgery

## 2013-04-18 DIAGNOSIS — B358 Other dermatophytoses: Secondary | ICD-10-CM | POA: Insufficient documentation

## 2013-05-02 DIAGNOSIS — E119 Type 2 diabetes mellitus without complications: Secondary | ICD-10-CM | POA: Diagnosis not present

## 2013-05-02 DIAGNOSIS — H179 Unspecified corneal scar and opacity: Secondary | ICD-10-CM | POA: Diagnosis not present

## 2013-05-02 DIAGNOSIS — H52209 Unspecified astigmatism, unspecified eye: Secondary | ICD-10-CM | POA: Diagnosis not present

## 2013-05-02 DIAGNOSIS — Z961 Presence of intraocular lens: Secondary | ICD-10-CM | POA: Diagnosis not present

## 2013-05-24 ENCOUNTER — Encounter: Payer: Self-pay | Admitting: Internal Medicine

## 2013-05-30 ENCOUNTER — Other Ambulatory Visit: Payer: Self-pay | Admitting: Internal Medicine

## 2013-06-01 ENCOUNTER — Other Ambulatory Visit (HOSPITAL_BASED_OUTPATIENT_CLINIC_OR_DEPARTMENT_OTHER): Payer: Self-pay | Admitting: Internal Medicine

## 2013-06-06 ENCOUNTER — Ambulatory Visit (INDEPENDENT_AMBULATORY_CARE_PROVIDER_SITE_OTHER): Payer: Medicare Other | Admitting: Internal Medicine

## 2013-06-06 ENCOUNTER — Encounter: Payer: Self-pay | Admitting: Internal Medicine

## 2013-06-06 VITALS — BP 138/84 | HR 64 | Temp 97.9°F | Wt 215.0 lb

## 2013-06-06 DIAGNOSIS — I498 Other specified cardiac arrhythmias: Secondary | ICD-10-CM

## 2013-06-06 DIAGNOSIS — I10 Essential (primary) hypertension: Secondary | ICD-10-CM | POA: Diagnosis not present

## 2013-06-06 DIAGNOSIS — IMO0002 Reserved for concepts with insufficient information to code with codable children: Secondary | ICD-10-CM

## 2013-06-06 DIAGNOSIS — E1129 Type 2 diabetes mellitus with other diabetic kidney complication: Secondary | ICD-10-CM

## 2013-06-06 DIAGNOSIS — N183 Chronic kidney disease, stage 3 unspecified: Secondary | ICD-10-CM

## 2013-06-06 DIAGNOSIS — R600 Localized edema: Secondary | ICD-10-CM | POA: Insufficient documentation

## 2013-06-06 DIAGNOSIS — M109 Gout, unspecified: Secondary | ICD-10-CM

## 2013-06-06 DIAGNOSIS — E1165 Type 2 diabetes mellitus with hyperglycemia: Principal | ICD-10-CM

## 2013-06-06 DIAGNOSIS — R001 Bradycardia, unspecified: Secondary | ICD-10-CM

## 2013-06-06 DIAGNOSIS — R609 Edema, unspecified: Secondary | ICD-10-CM

## 2013-06-06 MED ORDER — INSULIN PEN NEEDLE 31G X 8 MM MISC: Status: DC

## 2013-06-06 MED ORDER — FUROSEMIDE 20 MG PO TABS: 20.0000 mg | ORAL_TABLET | Freq: Every day | ORAL | Status: DC

## 2013-06-06 MED ORDER — INSULIN DETEMIR 100 UNIT/ML FLEXPEN: PEN_INJECTOR | SUBCUTANEOUS | Status: DC

## 2013-06-06 MED ORDER — INSULIN ASPART 100 UNIT/ML ~~LOC~~ SOLN: 6.0000 [IU] | Freq: Three times a day (TID) | SUBCUTANEOUS | Status: DC

## 2013-06-06 NOTE — Patient Instructions (Signed)
Do not use your ted hose until we call and let you know.  Check blood sugar three times a day and if blood sugar is less than 70 or more than 300, notify us

## 2013-06-06 NOTE — Progress Notes (Signed)
Patient ID: John Romero, male   DOB: 1930/12/01, 80 y.o.   MRN: [id number]    Chief Complaint  Patient presents with  . Medical Management of Chronic Issues    follow up visit for dm, bradycardia  . Form Completion    Fill out Handicap Form    No Known Allergies  HPI 80 y.o male patient is here for follow up on his sugar reading and slow heart rate. His heart rate appears normal today. Also reviewed his cbg- 80-268 with one reading of 48 2 months back. No further hypoglycemic reading noted. Pt injecting 35 u levemir and 6 u novolog premeal for now. Also on metformin.  He has been having swelling in his legs. Denies leg pain No dyspnea or chest pain Has flat affect. When asked about being depressed, he mentions that he is currently grieving about losing his wife but otherwise does not feel depressed  Review of Systems   Constitutional: Negative for fever, chills, weight loss/ gain, malaise/fatigue and diaphoresis.   HENT: Negative for congestion, hearing loss and sore throat.    Respiratory: Negative for cough, sputum production, shortness of breath and wheezing.    Cardiovascular: Negative for chest pain, palpitations, orthopnea and leg swelling.   Gastrointestinal: Negative for heartburn, nausea, vomiting, abdominal pain, diarrhea and constipation.   Genitourinary: Negative for dysuria, urgency, frequency and flank pain.   Musculoskeletal: Negative for falls Skin: Negative for itching   Neurological: Negative for dizziness, tingling, focal weakness and headaches.   Psychiatric/Behavioral: Negative for memory loss. The patient is not nervous/anxious.    Medication reviewed. See [facility]  Physical exam BP 138/84  Pulse 64  Temp(Src) 97.9 F (36.6 C) (Oral)  Wt 215 lb (97.523 kg)  SpO2 95%  Filed Weights   06/06/13 0926  Weight: 215 lb (97.523 kg)   Wt Readings from Last 3 Encounters:  06/06/13 215 lb (97.523 kg)  03/07/13 215 lb (97.523 kg)  01/03/13 212 lb (96.163 kg)     General- elderly male in no acute distress Head- atraumatic, normocephalic Eyes- PERRLA, EOMI, no pallor, no icterus Neck- no lymphadenopathy, no thyromegaly, no jugular vein distension, no carotid bruit Chest- no chest wall deformities, no chest wall tenderness Cardiovascular- normal s1,s2, no murmurs/ rubs/ gallops, unable to palpate the dorsalis pedis and posterior tibialis with the amount of swelling present but has normal color in both feet with good refill Respiratory- bilateral clear to auscultation, no wheeze, no rhonchi, no crackles Abdomen- bowel sounds present, soft, non tender, no organomegaly, no abdominal bruits, no guarding or rigidity, no CVA tenderness Musculoskeletal- able to move all 4 extremities, no spinal and paraspinal tenderness, has edema in both legs but swelling in left > right. Has 1+ pitting edema, hypertrophied thick toe nail in both big toes Neurological- no focal deficit, normal pinprick and vibration sensation in both legs Skin- has erythema on both lower legs anteriorly, no warmth, no drainage Psychiatry- alert and oriented to person, place and time, normal mood and affect  Labs- Lab Results  Component Value Date   HGBA1C 9.3* 03/07/2013   Lab Results  Component Value Date   CREATININE 1.11 03/07/2013    Assessment/plan  1. DM type 2, uncontrolled, with renal complications Recheck O3J today. Continue current regimen of levemir and novolog with metformin. Review urine microalbumin. Foot exam done. Continue ramipril and zocor and aspirin - Hemoglobin A1c - CMP  2. Unspecified essential hypertension Stable. Continue amlodipine with clonidine, ramipril and hctz. Tolerating lower dose  of lopressor well.  3. Chronic kidney disease, stage 3 Avoid NSAids. His dm and HTN are likely causing this  4. Bradycardia Improved normal heart rate. Monitor clinically  5. Gout With him on allopurinol 300 mg daily and no recent gout flare, check uric acid  level. If < 7 will decrease the dodse of allopurinol - Uric Acid  6. Lower leg edema With asymmetry, will rule out DVT. Will get venous doppler to rule this out. Also have concern for PVD given skin changes noted on both anterior shin. Will have him on lasix 20 mg daily to help with the edema. To wear ted hose in both legs (once dvt is ruled out). Will get ekg and echocardiogram if pt develops any shortness of breath or no improvement of leg edema - Compression stockings - US Venous Img Lower Unilateral Left; Future

## 2013-06-07 ENCOUNTER — Ambulatory Visit
Admission: RE | Admit: 2013-06-07 | Discharge: 2013-06-07 | Disposition: A | Discharge status: Home / Self Care | Payer: Medicare Other | Source: Ambulatory Visit | Attending: Internal Medicine | Admitting: Internal Medicine

## 2013-06-07 DIAGNOSIS — R609 Edema, unspecified: Secondary | ICD-10-CM

## 2013-06-07 DIAGNOSIS — M7989 Other specified soft tissue disorders: Secondary | ICD-10-CM | POA: Diagnosis not present

## 2013-06-07 LAB — COMPREHENSIVE METABOLIC PANEL
ALK PHOS: 59 IU/L (ref 39–117)
ALT: 17 IU/L (ref 0–44)
AST: 22 IU/L (ref 0–40)
Albumin/Globulin Ratio: 2.2 (ref 1.1–2.5)
Albumin: 4.7 g/dL (ref 3.5–4.7)
BUN/Creatinine Ratio: 20 (ref 10–22)
BUN: 25 mg/dL (ref 8–27)
CHLORIDE: 101 mmol/L (ref 97–108)
CO2: 23 mmol/L (ref 18–29)
Calcium: 9.7 mg/dL (ref 8.6–10.2)
Creatinine, Ser: 1.28 mg/dL — ABNORMAL HIGH (ref 0.76–1.27)
GFR calc Af Amer: 60 mL/min/{1.73_m2} (ref >59)
GFR calc non Af Amer: 52 mL/min/{1.73_m2} — ABNORMAL LOW (ref >59)
GLUCOSE: 104 mg/dL — AB (ref 65–99)
Globulin, Total: 2.1 g/dL (ref 1.5–4.5)
POTASSIUM: 3.7 mmol/L (ref 3.5–5.2)
SODIUM: 142 mmol/L (ref 134–144)
Total Bilirubin: 0.4 mg/dL (ref 0.0–1.2)
Total Protein: 6.8 g/dL (ref 6.0–8.5)

## 2013-06-07 LAB — HEMOGLOBIN A1C
Est. average glucose Bld gHb Est-mCnc: 206 mg/dL
Hgb A1c MFr Bld: 8.8 % — ABNORMAL HIGH (ref 4.8–5.6)

## 2013-06-07 LAB — URIC ACID: URIC ACID: 7.2 mg/dL (ref 3.7–8.6)

## 2013-06-15 ENCOUNTER — Other Ambulatory Visit: Payer: Self-pay | Admitting: Internal Medicine

## 2013-06-27 ENCOUNTER — Other Ambulatory Visit: Payer: Self-pay | Admitting: Internal Medicine

## 2013-07-11 ENCOUNTER — Encounter: Payer: Self-pay | Admitting: Internal Medicine

## 2013-07-11 ENCOUNTER — Ambulatory Visit (INDEPENDENT_AMBULATORY_CARE_PROVIDER_SITE_OTHER): Payer: Medicare Other | Admitting: Internal Medicine

## 2013-07-11 VITALS — BP 142/82 | HR 89 | Temp 99.0°F | Resp 18 | Ht 71.0 in | Wt 213.0 lb

## 2013-07-11 DIAGNOSIS — E1129 Type 2 diabetes mellitus with other diabetic kidney complication: Secondary | ICD-10-CM | POA: Diagnosis not present

## 2013-07-11 DIAGNOSIS — N183 Chronic kidney disease, stage 3 unspecified: Secondary | ICD-10-CM | POA: Diagnosis not present

## 2013-07-11 DIAGNOSIS — I10 Essential (primary) hypertension: Secondary | ICD-10-CM

## 2013-07-11 DIAGNOSIS — IMO0002 Reserved for concepts with insufficient information to code with codable children: Secondary | ICD-10-CM

## 2013-07-11 DIAGNOSIS — R600 Localized edema: Secondary | ICD-10-CM

## 2013-07-11 DIAGNOSIS — E1165 Type 2 diabetes mellitus with hyperglycemia: Principal | ICD-10-CM

## 2013-07-11 DIAGNOSIS — R609 Edema, unspecified: Secondary | ICD-10-CM | POA: Diagnosis not present

## 2013-07-11 MED ORDER — FUROSEMIDE 20 MG PO TABS: 20.0000 mg | ORAL_TABLET | Freq: Every day | ORAL | Status: DC

## 2013-07-11 MED ORDER — INSULIN DETEMIR 100 UNIT/ML FLEXPEN: PEN_INJECTOR | SUBCUTANEOUS | Status: DC

## 2013-07-11 NOTE — Progress Notes (Signed)
Patient ID: [name], male   DOB: [birth year], 80 y.o.   MRN: [id number]    Chief Complaint  Patient presents with  . Follow-up   No Known Allergies  HPI 80 year old male patient is here for follow up. He has diabetes, HTN, CKD, HL among others. His swelling in the legs have improved. His sugar reading reviewed- average 114-122 with some reading in 60-80 range. This am cbg 69. Denies skipping meals. Mood imporved today. Patient more cheerful and interactive  Review of Systems   Constitutional: Negative for fever, chills, weight gain, malaise/fatigue and diaphoresis.   HENT: Negative for congestion, hearing loss and sore throat.    Respiratory: Negative for cough, sputum production, shortness of breath and wheezing.    Cardiovascular: Negative for chest pain, palpitations, orthopnea  Gastrointestinal: Negative for heartburn, nausea, vomiting, abdominal pain, diarrhea and constipation.   Genitourinary: Negative for dysuria, urgency, frequency and flank pain.   Musculoskeletal: Negative for falls Skin: Negative for itching   Neurological: Negative for dizziness, tingling, focal weakness and headaches.   Psychiatric/Behavioral: Negative for memory loss. The patient is not nervous/anxious.    Past Medical History  Diagnosis Date  . Type II or unspecified type diabetes mellitus without mention of complication, uncontrolled   . Other and unspecified hyperlipidemia   . Gout, unspecified   . Unspecified essential hypertension   . Type II or unspecified type diabetes mellitus with renal manifestations, uncontrolled   . 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 Gout, unspecified   . Type II or unspecified type diabetes mellitus without mention of complication, uncontrolled   . First degree atrioventricular block   . Malignant neoplasm of prostate   . Malignant neoplasm of prostate   . Type II or unspecified type diabetes mellitus without mention of complication,  not stated as uncontrolled   . Other and unspecified hyperlipidemia   . Unspecified tinnitus   . Unspecified essential hypertension   . Old myocardial infarction   . Coronary atherosclerosis of unspecified type of vessel, native or graft   . Edema   . Undiagnosed cardiac murmurs   . Undiagnosed cardiac murmurs    Current Outpatient Prescriptions on File Prior to Visit  Medication Sig Dispense Refill  . allopurinol (ZYLOPRIM) 300 MG tablet TAKE 1 TABLET DAILY TO PREVENT GOUT ATTACK  90 tablet  1  . amLODipine (NORVASC) 10 MG tablet TAKE 1 TABLET DAILY FOR BLOOD PRESSURE  90 tablet  0  . aspirin 325 MG EC tablet Take 325 mg by mouth daily.      . cloNIDine (CATAPRES) 0.3 MG tablet TAKE 1 TABLET TWICE A DAY FOR BLOOD PRESSURE  180 tablet  3  . glucose blood (TRUETEST TEST) test strip Use to test blood sugars twice daily. DX 250.00  100 each  6  . hydrochlorothiazide (HYDRODIURIL) 50 MG tablet TAKE 1 TABLET DAILY FOR BLOOD PRESSURE  90 tablet  0  . insulin aspart (NOVOLOG) 100 UNIT/ML injection Inject 6 Units into the skin 3 (three) times daily with meals.  1 vial  3  . Insulin Pen Needle (B-D ULTRAFINE III SHORT PEN) 31G X 8 MM MISC Use 2 pen needles daily with the administration of Insulin. DX 250.00  200 each  3  . KLOR-CON M10 10 MEQ tablet TAKE 1 TABLET DAILY FOR POTASSIUM  90 tablet  0  . metFORMIN (GLUCOPHAGE) 500 MG tablet Take 1 tablet (500 mg total) by mouth 2 (two) times  daily with a meal.  180 tablet  0  . metoprolol (LOPRESSOR) 25 MG tablet TAKE 1 TABLET TWICE A DAY TO CONTROL HEART RHYTHM  180 tablet  3  . Multiple Vitamin (MULTIVITAMIN) tablet Take 1 tablet by mouth daily.      . ramipril (ALTACE) 10 MG capsule TAKE 1 CAPSULE TWICE A DAY TO CONTROL BLOOD PRESSURE  180 capsule  3  . simvastatin (ZOCOR) 20 MG tablet TAKE 1 TABLET DAILY TO LOWER CHOLESTEROL  90 tablet  0   No current facility-administered medications on file prior to visit.    Physical exam BP 142/82  Pulse 89   Temp(Src) 99 F (37.2 C) (Oral)  Resp 18  Ht 5\' 11"  (1.803 m)  Wt 213 lb (96.616 kg)  BMI 29.72 kg/m2  SpO2 97%  Wt Readings from Last 3 Encounters:  07/11/13 213 lb (96.616 kg)  06/06/13 215 lb (97.523 kg)  03/07/13 215 lb (97.523 kg)   General- elderly male in no acute distress, overweight Head- atraumatic, normocephalic Eyes- PERRLA, EOMI, no pallor, no icterus Neck- no lymphadenopathy, no thyromegaly, no jugular vein distension, no carotid bruit Chest- no chest wall deformities, no chest wall tenderness Cardiovascular- normal s1,s2, no murmurs, good dorsalis pedis in both legs Respiratory- bilateral clear to auscultation, no wheeze, no rhonchi, no crackles Abdomen- bowel sounds present, soft, non tender Musculoskeletal- able to move all 4 extremities, no spinal and paraspinal tenderness, has edema in both legs but swelling in left > right. Has 1+ pitting edema, hypertrophied thick toe nail in both big toes Neurological- no focal deficit, normal pinprick and vibration sensation in both legs Skin- improved erythema on both lower legs anteriorly, no warmth, no drainage, chronic stasis skin changes Psychiatry- alert and oriented to person, place and time, normal mood and affect  Labs  Lab Results  Component Value Date   CREATININE 1.28* 06/06/2013   Lab Results  Component Value Date   HGBA1C 8.8* 06/06/2013    Assessment/plan  1. DM type 2, uncontrolled, with renal complications Reviewed home blood sugar reading. Decrease levemir to 30 u for now wit low cbg readings to avoid hypoglycemia and it's complications. Continue novolog 6 u with meals for cbg> 150. Continue metformin and recheck renal function today. Also check urine microalbumin. Continue ACEI - Microalbumin/Creatinine Ratio, Urine - Hemoglobin A1c; Future  2. Unspecified essential hypertension Overall stable reading. Continue clonidine and ramipril. Continue hctz but if renal function has worsened on lab today,  consider d/c and have him on hydralazine - Basic Metabolic Panel - CMP; Future - Lipid Panel; Future  3. Lower leg edema Improved. Continue lasix 20 mg daily and check potassium level today  4. Chronic kidney disease, stage 3 With diabetes and HTN. check protein in urine. Avoid NSAIDs. HTN and DM control required to help slow down progression.  F/u in 3 months with lab work

## 2013-07-12 LAB — BASIC METABOLIC PANEL
BUN/Creatinine Ratio: 18 (ref 10–22)
BUN: 26 mg/dL (ref 8–27)
CO2: 24 mmol/L (ref 18–29)
Calcium: 10 mg/dL (ref 8.6–10.2)
Chloride: 99 mmol/L (ref 97–108)
Creatinine, Ser: 1.47 mg/dL — ABNORMAL HIGH (ref 0.76–1.27)
GFR, EST AFRICAN AMERICAN: 51 mL/min/{1.73_m2} — AB (ref >59)
GFR, EST NON AFRICAN AMERICAN: 44 mL/min/{1.73_m2} — AB (ref >59)
Glucose: 188 mg/dL — ABNORMAL HIGH (ref 65–99)
Potassium: 3.5 mmol/L (ref 3.5–5.2)
Sodium: 144 mmol/L (ref 134–144)

## 2013-07-12 LAB — MICROALBUMIN / CREATININE URINE RATIO
Creatinine, Ur: 153.4 mg/dL (ref 22.0–328.0)
MICROALB/CREAT RATIO: 44.8 mg/g{creat} — AB (ref 0.0–30.0)
Microalbumin, Urine: 68.7 ug/mL — ABNORMAL HIGH (ref 0.0–17.0)

## 2013-07-13 ENCOUNTER — Telehealth: Payer: Self-pay | Admitting: *Deleted

## 2013-07-13 NOTE — Telephone Encounter (Signed)
Message copied by Eilene Ghazi on Fri Jul 13, 2013  2:51 PM ------      Message from: Blanchie Serve      Created: Fri Jul 13, 2013 12:14 PM       Your urine and blood test is suggestive of worsening kidney function from your diabetes. Continue to monitor your blood sugar. Take your medication as prescribed ------

## 2013-07-13 NOTE — Telephone Encounter (Signed)
Spoke with patient regarding lab results, he stated that he understood the information given but  didn't have any questions at this time.

## 2013-08-08 ENCOUNTER — Other Ambulatory Visit: Payer: Self-pay | Admitting: Internal Medicine

## 2013-09-03 ENCOUNTER — Other Ambulatory Visit: Payer: Self-pay

## 2013-09-03 MED ORDER — AMLODIPINE BESYLATE 10 MG PO TABS: ORAL_TABLET | ORAL | Status: DC

## 2013-09-03 NOTE — Telephone Encounter (Signed)
Pharmacy called requesting approval

## 2013-09-11 ENCOUNTER — Other Ambulatory Visit: Payer: Self-pay | Admitting: *Deleted

## 2013-09-11 MED ORDER — CLONIDINE HCL 0.3 MG PO TABS: ORAL_TABLET | ORAL | Status: DC

## 2013-09-11 MED ORDER — HYDROCHLOROTHIAZIDE 50 MG PO TABS: ORAL_TABLET | ORAL | Status: DC

## 2013-09-11 NOTE — Telephone Encounter (Signed)
[facility]

## 2013-09-25 ENCOUNTER — Other Ambulatory Visit: Payer: Self-pay | Admitting: Internal Medicine

## 2013-10-05 ENCOUNTER — Other Ambulatory Visit: Payer: Self-pay | Admitting: Internal Medicine

## 2013-10-15 ENCOUNTER — Other Ambulatory Visit: Payer: Medicare Other

## 2013-10-15 DIAGNOSIS — E1165 Type 2 diabetes mellitus with hyperglycemia: Secondary | ICD-10-CM | POA: Diagnosis not present

## 2013-10-15 DIAGNOSIS — E1129 Type 2 diabetes mellitus with other diabetic kidney complication: Secondary | ICD-10-CM | POA: Diagnosis not present

## 2013-10-15 DIAGNOSIS — I10 Essential (primary) hypertension: Secondary | ICD-10-CM

## 2013-10-15 DIAGNOSIS — IMO0002 Reserved for concepts with insufficient information to code with codable children: Secondary | ICD-10-CM

## 2013-10-16 DIAGNOSIS — Z23 Encounter for immunization: Secondary | ICD-10-CM | POA: Diagnosis not present

## 2013-10-16 LAB — COMPREHENSIVE METABOLIC PANEL
A/G RATIO: 2.4 (ref 1.1–2.5)
ALT: 16 IU/L (ref 0–44)
AST: 19 IU/L (ref 0–40)
Albumin: 4.4 g/dL (ref 3.5–4.7)
Alkaline Phosphatase: 64 IU/L (ref 39–117)
BUN/Creatinine Ratio: 16 (ref 10–22)
BUN: 20 mg/dL (ref 8–27)
CALCIUM: 9.7 mg/dL (ref 8.6–10.2)
CO2: 25 mmol/L (ref 18–29)
CREATININE: 1.24 mg/dL (ref 0.76–1.27)
Chloride: 100 mmol/L (ref 97–108)
GFR calc non Af Amer: 54 mL/min/{1.73_m2} — ABNORMAL LOW (ref >59)
GFR, EST AFRICAN AMERICAN: 63 mL/min/{1.73_m2} (ref >59)
GLUCOSE: 114 mg/dL — AB (ref 65–99)
Globulin, Total: 1.8 g/dL (ref 1.5–4.5)
Potassium: 3.4 mmol/L — ABNORMAL LOW (ref 3.5–5.2)
Sodium: 144 mmol/L (ref 134–144)
TOTAL PROTEIN: 6.2 g/dL (ref 6.0–8.5)
Total Bilirubin: 0.4 mg/dL (ref 0.0–1.2)

## 2013-10-16 LAB — HEMOGLOBIN A1C
ESTIMATED AVERAGE GLUCOSE: 214 mg/dL
HEMOGLOBIN A1C: 9.1 % — AB (ref 4.8–5.6)

## 2013-10-16 LAB — LIPID PANEL
Chol/HDL Ratio: 3.2 ratio units (ref 0.0–5.0)
Cholesterol, Total: 153 mg/dL (ref 100–199)
HDL: 48 mg/dL (ref >39)
LDL CALC: 80 mg/dL (ref 0–99)
Triglycerides: 125 mg/dL (ref 0–149)
VLDL CHOLESTEROL CAL: 25 mg/dL (ref 5–40)

## 2013-10-17 ENCOUNTER — Encounter: Payer: Self-pay | Admitting: Internal Medicine

## 2013-10-17 ENCOUNTER — Ambulatory Visit (INDEPENDENT_AMBULATORY_CARE_PROVIDER_SITE_OTHER): Payer: Medicare Other | Admitting: Internal Medicine

## 2013-10-17 VITALS — BP 160/80 | HR 81 | Temp 97.8°F | Resp 18 | Ht 71.0 in | Wt 207.6 lb

## 2013-10-17 DIAGNOSIS — E1165 Type 2 diabetes mellitus with hyperglycemia: Principal | ICD-10-CM

## 2013-10-17 DIAGNOSIS — M1A00X0 Idiopathic chronic gout, unspecified site, without tophus (tophi): Secondary | ICD-10-CM | POA: Diagnosis not present

## 2013-10-17 DIAGNOSIS — M1A9XX0 Chronic gout, unspecified, without tophus (tophi): Secondary | ICD-10-CM

## 2013-10-17 DIAGNOSIS — I129 Hypertensive chronic kidney disease with stage 1 through stage 4 chronic kidney disease, or unspecified chronic kidney disease: Secondary | ICD-10-CM

## 2013-10-17 DIAGNOSIS — E1129 Type 2 diabetes mellitus with other diabetic kidney complication: Secondary | ICD-10-CM | POA: Diagnosis not present

## 2013-10-17 DIAGNOSIS — IMO0002 Reserved for concepts with insufficient information to code with codable children: Secondary | ICD-10-CM

## 2013-10-17 DIAGNOSIS — E876 Hypokalemia: Secondary | ICD-10-CM | POA: Diagnosis not present

## 2013-10-17 DIAGNOSIS — E785 Hyperlipidemia, unspecified: Secondary | ICD-10-CM

## 2013-10-17 MED ORDER — INSULIN DETEMIR 100 UNIT/ML FLEXPEN: PEN_INJECTOR | SUBCUTANEOUS | Status: DC

## 2013-10-17 MED ORDER — POTASSIUM CHLORIDE CRYS ER 20 MEQ PO TBCR: 20.0000 meq | EXTENDED_RELEASE_TABLET | Freq: Every day | ORAL | Status: DC

## 2013-10-17 MED ORDER — LINAGLIPTIN 5 MG PO TABS: 5.0000 mg | ORAL_TABLET | Freq: Every day | ORAL | Status: DC

## 2013-10-17 NOTE — Progress Notes (Signed)
Patient ID: John Romero, male   DOB: 04/13/1930, 80 y.o.   MRN: [id number]    Chief Complaint  Patient presents with  . Medical Management of Chronic Issues   No Known Allergies  HPI 80 y/o male patient is seen for routine visit. He has DM, HTN. bp in office elevated today. Mentions bp at home have been better bp at home 148/70 Upper number below 150 and lower number 60-70 cbg this am 129 cbg on 9/25 was 60 cbg 110-250 average 144-159 Denies skipping meals  Review of Systems   Constitutional: Negative for fever, chills, weight gain, malaise/fatigue and diaphoresis.   HENT: Negative for congestion, hearing loss and sore throat.    Respiratory: Negative for cough, sputum production, shortness of breath and wheezing.    Cardiovascular: Negative for chest pain, palpitations, orthopnea   Gastrointestinal: Negative for heartburn, nausea, vomiting, abdominal pain, diarrhea and constipation.   Genitourinary: Negative for dysuria Musculoskeletal: Negative for falls Skin: Negative for itching   Neurological: Negative for dizziness, tingling, focal weakness and headaches.   Psychiatric/Behavioral: Negative for memory loss. The patient is not nervous/anxious.      Past Medical History  Diagnosis Date  . Type II or unspecified type diabetes mellitus without mention of complication, uncontrolled   . Other and unspecified hyperlipidemia   . Gout, unspecified   . Unspecified essential hypertension   . Type II or unspecified type diabetes mellitus with renal manifestations, uncontrolled   . 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 Gout, unspecified   . Type II or unspecified type diabetes mellitus without mention of complication, uncontrolled   . First degree atrioventricular block   . Malignant neoplasm of prostate   . Malignant neoplasm of prostate   . Type II or unspecified type diabetes mellitus without mention of complication, not stated as uncontrolled   .  Other and unspecified hyperlipidemia   . Unspecified tinnitus   . Unspecified essential hypertension   . Old myocardial infarction   . Coronary atherosclerosis of unspecified type of vessel, native or graft   . Edema   . Undiagnosed cardiac murmurs   . Undiagnosed cardiac murmurs    Current Outpatient Prescriptions on File Prior to Visit  Medication Sig Dispense Refill  . allopurinol (ZYLOPRIM) 300 MG tablet TAKE 1 TABLET DAILY TO PREVENT GOUT ATTACK  90 tablet  1  . amLODipine (NORVASC) 10 MG tablet TAKE 1 TABLET DAILY FOR BLOOD PRESSURE  90 tablet  1  . aspirin 325 MG EC tablet Take 325 mg by mouth daily.      . cloNIDine (CATAPRES) 0.3 MG tablet Take one tablet by mouth twice daily for blood pressure  180 tablet  3  . furosemide (LASIX) 20 MG tablet Take 1 tablet (20 mg total) by mouth daily.  90 tablet  3  . glucose blood (TRUETEST TEST) test strip Use to test blood sugars twice daily. DX 250.00  100 each  6  . hydrochlorothiazide (HYDRODIURIL) 50 MG tablet Take one tablet by mouth once daily for blood pressure  90 tablet  3  . insulin aspart (NOVOLOG) 100 UNIT/ML injection Inject 6 Units into the skin 3 (three) times daily with meals.  1 vial  3  . Insulin Pen Needle (B-D ULTRAFINE III SHORT PEN) 31G X 8 MM MISC Use 2 pen needles daily with the administration of Insulin. DX 250.00  200 each  3  . metoprolol (LOPRESSOR) 25 MG tablet TAKE 1 TABLET  TWICE A DAY TO CONTROL HEART RHYTHM  180 tablet  3  . Multiple Vitamin (MULTIVITAMIN) tablet Take 1 tablet by mouth daily.      . ramipril (ALTACE) 10 MG capsule TAKE 1 CAPSULE TWICE A DAY TO CONTROL BLOOD PRESSURE  180 capsule  3  . simvastatin (ZOCOR) 20 MG tablet TAKE 1 TABLET DAILY TO LOWER CHOLESTEROL  90 tablet  0   No current facility-administered medications on file prior to visit.   Physical exam BP 160/80  Pulse 81  Temp(Src) 97.8 F (36.6 C) (Oral)  Resp 18  Ht 5\' 11"  (1.803 m)  Wt 207 lb 9.6 oz (94.167 kg)  BMI 28.97 kg/m2   SpO2 97%  General- elderly male in no acute distress, overweight Head- atraumatic, normocephalic Eyes- PERRLA, EOMI, no pallor, no icterus Neck- no lymphadenopathy, no thyromegaly, no jugular vein distension, no carotid bruit Cardiovascular- normal s1,s2, no murmurs, good dorsalis pedis in both legs Respiratory- bilateral clear to auscultation, no wheeze, no rhonchi, no crackles Abdomen- bowel sounds present, soft, non tender Musculoskeletal- able to move all 4 extremities, no spinal and paraspinal tenderness, has edema in both legs but swelling in left > right. Has 1+ pitting edema, hypertrophied thick toe nail in both big toes Neurological- no focal deficit, normal pinprick and vibration sensation in both legs Skin- improved erythema on both lower legs anteriorly, no warmth, no drainage, chronic stasis skin changes Psychiatry- alert and oriented to person, place and time, normal mood and affect   Lab Results  Component Value Date   HGBA1C 9.1* 10/15/2013   Lipid Panel     Component Value Date/Time   TRIG 125 10/15/2013 0804   HDL 48 10/15/2013 0804   CHOLHDL 3.2 10/15/2013 0804   LDLCALC 80 10/15/2013 0804   Lab Results  Component Value Date   CREATININE 1.24 10/15/2013    Assessment/plan  1. DM type 2, uncontrolled, with renal complications With hiskidney function and worsening a1c, d/c metformin and start tradjenta 5 mg daily. Continue levemir 30 u and premeal novolog 6 u for cbg > 150 for now and monitor. Continue zocor, asa, ramipril. Has microalbuminuria. - CMP; Future - CBC with Differential; Future - Hemoglobin A1c; Future  2. Hypertensive renal disease, stage 1-4 or unspecified chronic kidney disease Will stop metformin Continue aspirin with clonidine and hctz and ramipril and metoprolol monitor renal function  3. Chronic gout without tophus, unspecified cause, unspecified site No recent attack, continue allopurinol  4. Hypokalemia Monitor bmp, increase kcl to  20 meq daily for now and monitor   5. Hyperlipidemia LDL goal <100 Stable, continue current regimen of zocor

## 2013-10-18 ENCOUNTER — Other Ambulatory Visit: Payer: Self-pay | Admitting: *Deleted

## 2013-10-18 MED ORDER — POTASSIUM CHLORIDE CRYS ER 20 MEQ PO TBCR: 20.0000 meq | EXTENDED_RELEASE_TABLET | Freq: Every day | ORAL | Status: DC

## 2013-10-18 NOTE — Telephone Encounter (Signed)
[facility] 

## 2013-11-01 ENCOUNTER — Other Ambulatory Visit: Payer: Self-pay | Admitting: *Deleted

## 2013-11-01 MED ORDER — LINAGLIPTIN 5 MG PO TABS: 5.0000 mg | ORAL_TABLET | Freq: Every day | ORAL | Status: DC

## 2013-11-01 NOTE — Telephone Encounter (Signed)
Patient Called and Requested a 90 day supply

## 2013-11-19 ENCOUNTER — Other Ambulatory Visit: Payer: Self-pay | Admitting: *Deleted

## 2013-11-19 MED ORDER — RAMIPRIL 10 MG PO CAPS: ORAL_CAPSULE | ORAL | Status: DC

## 2013-11-19 NOTE — Telephone Encounter (Signed)
Patient called and requested Rx to be faxed to pharmacy. 

## 2014-01-11 ENCOUNTER — Other Ambulatory Visit: Payer: Self-pay | Admitting: Internal Medicine

## 2014-01-21 ENCOUNTER — Other Ambulatory Visit: Payer: Medicare Other

## 2014-01-23 ENCOUNTER — Encounter: Payer: Medicare Other | Admitting: Internal Medicine

## 2014-02-07 ENCOUNTER — Other Ambulatory Visit: Payer: Self-pay | Admitting: Internal Medicine

## 2014-02-15 ENCOUNTER — Other Ambulatory Visit: Payer: Medicare Other

## 2014-02-15 DIAGNOSIS — E1165 Type 2 diabetes mellitus with hyperglycemia: Secondary | ICD-10-CM | POA: Diagnosis not present

## 2014-02-15 DIAGNOSIS — E1129 Type 2 diabetes mellitus with other diabetic kidney complication: Secondary | ICD-10-CM

## 2014-02-15 DIAGNOSIS — IMO0002 Reserved for concepts with insufficient information to code with codable children: Secondary | ICD-10-CM

## 2014-02-16 LAB — HEMOGLOBIN A1C
Est. average glucose Bld gHb Est-mCnc: 217 mg/dL
Hgb A1c MFr Bld: 9.2 % — ABNORMAL HIGH (ref 4.8–5.6)

## 2014-02-16 LAB — COMPREHENSIVE METABOLIC PANEL
ALK PHOS: 71 IU/L (ref 39–117)
ALT: 20 IU/L (ref 0–44)
AST: 20 IU/L (ref 0–40)
Albumin/Globulin Ratio: 1.8 (ref 1.1–2.5)
Albumin: 4.4 g/dL (ref 3.5–4.7)
BUN / CREAT RATIO: 19 (ref 10–22)
BUN: 27 mg/dL (ref 8–27)
CO2: 25 mmol/L (ref 18–29)
Calcium: 10.2 mg/dL (ref 8.6–10.2)
Chloride: 98 mmol/L (ref 97–108)
Creatinine, Ser: 1.39 mg/dL — ABNORMAL HIGH (ref 0.76–1.27)
GFR calc Af Amer: 55 mL/min/{1.73_m2} — ABNORMAL LOW (ref >59)
GFR calc non Af Amer: 47 mL/min/{1.73_m2} — ABNORMAL LOW (ref >59)
GLUCOSE: 158 mg/dL — AB (ref 65–99)
Globulin, Total: 2.5 g/dL (ref 1.5–4.5)
POTASSIUM: 3.4 mmol/L — AB (ref 3.5–5.2)
Sodium: 143 mmol/L (ref 134–144)
TOTAL PROTEIN: 6.9 g/dL (ref 6.0–8.5)
Total Bilirubin: 0.5 mg/dL (ref 0.0–1.2)

## 2014-02-16 LAB — CBC WITH DIFFERENTIAL/PLATELET
BASOS ABS: 0.1 10*3/uL (ref 0.0–0.2)
Basos: 1 %
Eos: 5 %
Eosinophils Absolute: 0.3 10*3/uL (ref 0.0–0.4)
HCT: 44.2 % (ref 37.5–51.0)
Hemoglobin: 14.9 g/dL (ref 12.6–17.7)
IMMATURE GRANS (ABS): 0 10*3/uL (ref 0.0–0.1)
Immature Granulocytes: 0 %
LYMPHS ABS: 2.3 10*3/uL (ref 0.7–3.1)
LYMPHS: 33 %
MCH: 30.3 pg (ref 26.6–33.0)
MCHC: 33.7 g/dL (ref 31.5–35.7)
MCV: 90 fL (ref 79–97)
MONOCYTES: 8 %
Monocytes Absolute: 0.6 10*3/uL (ref 0.1–0.9)
NEUTROS ABS: 3.6 10*3/uL (ref 1.4–7.0)
Neutrophils Relative %: 53 %
PLATELETS: 253 10*3/uL (ref 150–379)
RBC: 4.91 x10E6/uL (ref 4.14–5.80)
RDW: 13.8 % (ref 12.3–15.4)
WBC: 6.9 10*3/uL (ref 3.4–10.8)

## 2014-02-19 ENCOUNTER — Encounter: Payer: Self-pay | Admitting: Internal Medicine

## 2014-02-19 ENCOUNTER — Other Ambulatory Visit: Payer: Self-pay | Admitting: Internal Medicine

## 2014-02-19 ENCOUNTER — Ambulatory Visit (INDEPENDENT_AMBULATORY_CARE_PROVIDER_SITE_OTHER): Payer: Medicare Other | Admitting: Internal Medicine

## 2014-02-19 VITALS — BP 138/62 | HR 51 | Temp 97.3°F | Resp 20 | Ht 71.0 in | Wt 207.5 lb

## 2014-02-19 DIAGNOSIS — N183 Chronic kidney disease, stage 3 unspecified: Secondary | ICD-10-CM

## 2014-02-19 DIAGNOSIS — M1A9XX0 Chronic gout, unspecified, without tophus (tophi): Secondary | ICD-10-CM | POA: Diagnosis not present

## 2014-02-19 DIAGNOSIS — I440 Atrioventricular block, first degree: Secondary | ICD-10-CM | POA: Diagnosis not present

## 2014-02-19 DIAGNOSIS — I10 Essential (primary) hypertension: Secondary | ICD-10-CM | POA: Insufficient documentation

## 2014-02-19 DIAGNOSIS — E1129 Type 2 diabetes mellitus with other diabetic kidney complication: Secondary | ICD-10-CM

## 2014-02-19 DIAGNOSIS — Z0000 Encounter for general adult medical examination without abnormal findings: Secondary | ICD-10-CM

## 2014-02-19 DIAGNOSIS — Z23 Encounter for immunization: Secondary | ICD-10-CM | POA: Diagnosis not present

## 2014-02-19 DIAGNOSIS — E785 Hyperlipidemia, unspecified: Secondary | ICD-10-CM | POA: Diagnosis not present

## 2014-02-19 DIAGNOSIS — E876 Hypokalemia: Secondary | ICD-10-CM | POA: Diagnosis not present

## 2014-02-19 DIAGNOSIS — E1165 Type 2 diabetes mellitus with hyperglycemia: Secondary | ICD-10-CM | POA: Diagnosis not present

## 2014-02-19 DIAGNOSIS — IMO0002 Reserved for concepts with insufficient information to code with codable children: Secondary | ICD-10-CM

## 2014-02-19 MED ORDER — INSULIN ASPART 100 UNIT/ML ~~LOC~~ SOLN: 6.0000 [IU] | Freq: Three times a day (TID) | SUBCUTANEOUS | Status: DC

## 2014-02-19 MED ORDER — METOPROLOL TARTRATE 25 MG PO TABS: ORAL_TABLET | ORAL | Status: DC

## 2014-02-19 MED ORDER — INSULIN DETEMIR 100 UNIT/ML FLEXPEN: PEN_INJECTOR | SUBCUTANEOUS | Status: DC

## 2014-02-19 NOTE — Progress Notes (Signed)
Patient ID: John Romero, male   DOB: [birth year], 81 y.o.   MRN: [id number]    Chief Complaint  Patient presents with  . Annual Exam    no concerns at this time (labs printed)   No Known Allergies  HPI 81 y/o male pt seen for annual exam. He complaints of feeling tired. He is taking his diabetic medication. cbg at home averages at 167 with range 150-180 mostly, some readings in 200 range with 1 of 60. Pt mentions tradjenta is making him have night mares, upset stomach and the cost is high. He also complaints of occasional leg cramps  Review of Systems  Constitutional: Negative for fever, chills, weight loss, diaphoresis.  HENT: Negative for congestion, hearing loss and sore throat.   Eyes: Negative for blurred vision, double vision and discharge. has not had his eye exam Respiratory: Negative for cough, sputum production, shortness of breath and wheezing.   Cardiovascular: Negative for chest pain, palpitations, orthopnea and leg swelling.  Gastrointestinal: Negative for heartburn, nausea, vomiting, abdominal pain, diarrhea and constipation.  Genitourinary: Negative for dysuria, urgency, frequency and flank pain.  Musculoskeletal: Negative for back pain, falls, joint pain and myalgias.  Skin: Negative for itching and rash.  Neurological: Negative for dizziness, tingling, focal weakness and headaches.  Psychiatric/Behavioral: Negative for depression  Past Medical History  Diagnosis Date  . Type II or unspecified type diabetes mellitus without mention of complication, uncontrolled   . Other and unspecified hyperlipidemia   . Gout, unspecified   . Unspecified essential hypertension   . Type II or unspecified type diabetes mellitus with renal manifestations, uncontrolled   . 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 Gout, unspecified   . Type II or unspecified type diabetes mellitus without mention of complication, uncontrolled   . First degree atrioventricular  block   . Malignant neoplasm of prostate   . Malignant neoplasm of prostate   . Type II or unspecified type diabetes mellitus without mention of complication, not stated as uncontrolled   . Other and unspecified hyperlipidemia   . Unspecified tinnitus   . Unspecified essential hypertension   . Old myocardial infarction   . Coronary atherosclerosis of unspecified type of vessel, native or graft   . Edema   . Undiagnosed cardiac murmurs   . Undiagnosed cardiac murmurs    Past Surgical History  Procedure Laterality Date  . Appendectomy    . Prostate surgery    . Coronary artery bypass graft    . Eye surgery  2014   Family History  Problem Relation Age of Onset  . Diabetes Mother   . Heart disease Father   . Cancer Sister     lung  . Cancer Sister     lung   History   Social History  . Marital Status: Married    Spouse Name: N/A    Number of Children: N/A  . Years of Education: N/A   Occupational History  . Not on file.   Social History Main Topics  . Smoking status: Never Smoker   . Smokeless tobacco: Current User    Types: Chew  . Alcohol Use: No  . Drug Use: No  . Sexual Activity: Not on file     Comment: unknown   Other Topics Concern  . Not on file   Social History Narrative   Physical exam BP 138/62 mmHg  Pulse 51  Temp(Src) 97.3 F (36.3 C) (Oral)  Resp 20  Ht  5\' 11"  (1.803 m)  Wt 207 lb 8 oz (94.121 kg)  BMI 28.95 kg/m2  SpO2 96%  General- elderly male in no acute distress Head- atraumatic, normocephalic Eyes- PERRLA, EOMI, no pallor, no icterus, glasses Neck- no lymphadenopathy, no thyromegaly, no jugular vein distension, no carotid bruit Chest- no chest wall deformities, no chest wall tenderness Cardiovascular- normal s1,s2, no murmurs/ rubs/ gallops, good dorsalis pedis and radial pulse, leg edema mainly around ankle Respiratory- bilateral clear to auscultation, no wheeze, no rhonchi, no crackles Abdomen- bowel sounds present, soft, non  tender, no masses, no abdominal bruits, no guarding or rigidity, no CVA tenderness Musculoskeletal- able to move all 4 extremities, no spinal and paraspinal tenderness, steady gait, no use of assistive device Neurological- no focal deficit, normal reflexes, normal muscle strength, normal sensation to fine touch and vibration Skin- actinic keratosis changes in both arms and left ear, callus in right big toe Psychiatry- alert and oriented to person, place and time, normal mood and affect  Labs-  CBC Latest Ref Rng 02/15/2014  WBC 3.4 - 10.8 x10E3/uL 6.9  Hemoglobin 12.6 - 17.7 g/dL 14.9  Hematocrit 37.5 - 51.0 % 44.2  Platelets 150 - 379 x10E3/uL 253   CMP Latest Ref Rng 02/15/2014 10/15/2013 07/11/2013  Glucose 65 - 99 mg/dL 158(H) 114(H) 188(H)  BUN 8 - 27 mg/dL 27 20 26   Creatinine 0.76 - 1.27 mg/dL 1.39(H) 1.24 1.47(H)  Sodium 134 - 144 mmol/L 143 144 144  Potassium 3.5 - 5.2 mmol/L 3.4(L) 3.4(L) 3.5  Chloride 97 - 108 mmol/L 98 100 99  CO2 18 - 29 mmol/L 25 25 24   Calcium 8.6 - 10.2 mg/dL 10.2 9.7 10.0  Total Protein 6.0 - 8.5 g/dL 6.9 6.2 -  Albumin 3.5 - 4.7 g/dL 4.4 4.4 -  Total Bilirubin 0.0 - 1.2 mg/dL 0.5 0.4 -  Alkaline Phos 39 - 117 IU/L 71 64 -  AST 0 - 40 IU/L 20 19 -  ALT 0 - 44 IU/L 20 16 -   Lab Results  Component Value Date   HGBA1C 9.2* 02/15/2014   Lipid Panel     Component Value Date/Time   TRIG 125 10/15/2013 0804   HDL 48 10/15/2013 0804   CHOLHDL 3.2 10/15/2013 0804   LDLCALC 80 10/15/2013 0804   02/19/14 ekg- first degree av block with prolonged pr interval, can see p waves, HR 60/min, PAC noted, noprior ekg to compare  Assessment/plan  1. DM type 2, uncontrolled, with renal complications E0P 9.2 compared to 9.1 4 months back. Pt not tolerating tradjenta with side effect and cost is another issue. D/c tradjenta. Advised to increase levemir to 38 u sq daily and change SSI novolog to 6 u with meals for cbg 150-250 and 10 u for cbg 250-350. Reassess in 3  months. Advised to bring his cbg reading next OV. - CMP; Future - Hemoglobin A1c; Future  2. Chronic kidney disease, stage 3 Avoid NSAIDs, continue sugar control and bp control - CMP; Future - Hemoglobin A1c; Future  3. Essential hypertension Stable reading, continue hctz, amlodipine, lopressor, clonidine with ramipril and lasix. Monitor bmp.  - EKG 12-Lead; Standing - EKG 12-Lead  4. First degree heart block by electrocardiogram Noted on ekg, pt asymptomatic but not a great historian. Planned on getting holter monitor to rule out arryhtmia but pt declines it at present. Monitor clinically. With low heart rate, decrease lopressor to 12.5 mg bid.  5. Hypokalemia To take kcl 20 meq bid x 5  days, then once a day. Monitor bmp  6. Annual exam the patient was counseled regarding the appropriate use of alcohol, regular self-examination of the breasts on a monthly basis, prevention of dental and periodontal disease, diet, regular sustained exercise for at least 30 minutes 5 times per week, routine screening interval for mammogram as recommended by the [place] and ACOG, the proper use of sunscreen and protective clothing, tobacco use, and recommended schedule for GI hemoccult testing, colonoscopy, cholesterol, thyroid and diabetes screening.  7. Gout Continue allopurinol 300 mg daily, no recent gout attack  8. Hyperlipidemia Continue zocor 20 mg daily

## 2014-02-21 ENCOUNTER — Other Ambulatory Visit: Payer: Self-pay | Admitting: *Deleted

## 2014-02-21 MED ORDER — METOPROLOL TARTRATE 25 MG PO TABS: ORAL_TABLET | ORAL | Status: DC

## 2014-02-21 NOTE — Telephone Encounter (Signed)
[facility]

## 2014-02-26 ENCOUNTER — Other Ambulatory Visit: Payer: Self-pay | Admitting: *Deleted

## 2014-02-26 MED ORDER — RAMIPRIL 10 MG PO CAPS
ORAL_CAPSULE | ORAL | Status: DC
Start: 2014-02-26 — End: 2014-11-05

## 2014-02-26 MED ORDER — METOPROLOL TARTRATE 25 MG PO TABS: ORAL_TABLET | ORAL | Status: DC

## 2014-02-26 NOTE — Telephone Encounter (Signed)
Patient requested refill be sent to [facility].

## 2014-02-26 NOTE — Telephone Encounter (Signed)
[facility]

## 2014-03-07 ENCOUNTER — Telehealth: Payer: Self-pay | Admitting: *Deleted

## 2014-03-07 NOTE — Telephone Encounter (Signed)
Received fax from [facility] needing Progress notes and Labs of A1C to cover his diabetic supplies. Printed and faxed to [address] [place] [address]. Faxed to Attention: Medicare Billing Exceptions at # [phone].

## 2014-03-12 ENCOUNTER — Encounter: Payer: Self-pay | Admitting: Internal Medicine

## 2014-05-15 ENCOUNTER — Other Ambulatory Visit: Payer: Medicare Other

## 2014-05-21 ENCOUNTER — Ambulatory Visit: Payer: Medicare Other | Admitting: Internal Medicine

## 2014-05-22 ENCOUNTER — Ambulatory Visit: Payer: Medicare Other | Admitting: Internal Medicine

## 2014-06-03 DIAGNOSIS — Z8546 Personal history of malignant neoplasm of prostate: Secondary | ICD-10-CM | POA: Diagnosis not present

## 2014-06-03 DIAGNOSIS — R319 Hematuria, unspecified: Secondary | ICD-10-CM | POA: Diagnosis not present

## 2014-06-03 DIAGNOSIS — R899 Unspecified abnormal finding in specimens from other organs, systems and tissues: Secondary | ICD-10-CM | POA: Diagnosis not present

## 2014-06-03 DIAGNOSIS — N393 Stress incontinence (female) (male): Secondary | ICD-10-CM | POA: Insufficient documentation

## 2014-06-03 DIAGNOSIS — C61 Malignant neoplasm of prostate: Secondary | ICD-10-CM | POA: Insufficient documentation

## 2014-06-10 DIAGNOSIS — N281 Cyst of kidney, acquired: Secondary | ICD-10-CM | POA: Diagnosis not present

## 2014-06-10 DIAGNOSIS — R319 Hematuria, unspecified: Secondary | ICD-10-CM | POA: Diagnosis not present

## 2014-06-10 DIAGNOSIS — Z8546 Personal history of malignant neoplasm of prostate: Secondary | ICD-10-CM | POA: Diagnosis not present

## 2014-06-12 DIAGNOSIS — I129 Hypertensive chronic kidney disease with stage 1 through stage 4 chronic kidney disease, or unspecified chronic kidney disease: Secondary | ICD-10-CM | POA: Diagnosis not present

## 2014-06-12 DIAGNOSIS — I872 Venous insufficiency (chronic) (peripheral): Secondary | ICD-10-CM | POA: Diagnosis not present

## 2014-06-12 DIAGNOSIS — N183 Chronic kidney disease, stage 3 (moderate): Secondary | ICD-10-CM | POA: Diagnosis not present

## 2014-06-12 DIAGNOSIS — E1121 Type 2 diabetes mellitus with diabetic nephropathy: Secondary | ICD-10-CM | POA: Diagnosis not present

## 2014-07-08 DIAGNOSIS — Z794 Long term (current) use of insulin: Secondary | ICD-10-CM | POA: Diagnosis not present

## 2014-07-08 DIAGNOSIS — Z79899 Other long term (current) drug therapy: Secondary | ICD-10-CM | POA: Diagnosis not present

## 2014-07-08 DIAGNOSIS — I129 Hypertensive chronic kidney disease with stage 1 through stage 4 chronic kidney disease, or unspecified chronic kidney disease: Secondary | ICD-10-CM | POA: Diagnosis not present

## 2014-07-08 DIAGNOSIS — E1140 Type 2 diabetes mellitus with diabetic neuropathy, unspecified: Secondary | ICD-10-CM | POA: Diagnosis not present

## 2014-07-08 DIAGNOSIS — E1165 Type 2 diabetes mellitus with hyperglycemia: Secondary | ICD-10-CM | POA: Diagnosis not present

## 2014-07-08 DIAGNOSIS — E1121 Type 2 diabetes mellitus with diabetic nephropathy: Secondary | ICD-10-CM | POA: Diagnosis not present

## 2014-07-08 DIAGNOSIS — N183 Chronic kidney disease, stage 3 (moderate): Secondary | ICD-10-CM | POA: Diagnosis not present

## 2014-07-10 ENCOUNTER — Other Ambulatory Visit: Payer: Self-pay | Admitting: Internal Medicine

## 2014-07-12 ENCOUNTER — Other Ambulatory Visit: Payer: Self-pay | Admitting: Internal Medicine

## 2014-07-15 DIAGNOSIS — R319 Hematuria, unspecified: Secondary | ICD-10-CM | POA: Diagnosis not present

## 2014-07-15 DIAGNOSIS — Z8546 Personal history of malignant neoplasm of prostate: Secondary | ICD-10-CM | POA: Diagnosis not present

## 2014-07-15 DIAGNOSIS — C61 Malignant neoplasm of prostate: Secondary | ICD-10-CM | POA: Diagnosis not present

## 2014-07-15 DIAGNOSIS — Q6100 Congenital renal cyst, unspecified: Secondary | ICD-10-CM | POA: Diagnosis not present

## 2014-07-26 ENCOUNTER — Other Ambulatory Visit: Payer: Self-pay | Admitting: Internal Medicine

## 2014-08-09 DIAGNOSIS — E1121 Type 2 diabetes mellitus with diabetic nephropathy: Secondary | ICD-10-CM | POA: Diagnosis not present

## 2014-08-09 DIAGNOSIS — N183 Chronic kidney disease, stage 3 (moderate): Secondary | ICD-10-CM | POA: Diagnosis not present

## 2014-08-09 DIAGNOSIS — Z794 Long term (current) use of insulin: Secondary | ICD-10-CM | POA: Diagnosis not present

## 2014-08-09 DIAGNOSIS — I129 Hypertensive chronic kidney disease with stage 1 through stage 4 chronic kidney disease, or unspecified chronic kidney disease: Secondary | ICD-10-CM | POA: Diagnosis not present

## 2014-09-11 DIAGNOSIS — R739 Hyperglycemia, unspecified: Secondary | ICD-10-CM | POA: Diagnosis not present

## 2014-09-11 DIAGNOSIS — N183 Chronic kidney disease, stage 3 (moderate): Secondary | ICD-10-CM | POA: Diagnosis not present

## 2014-09-11 DIAGNOSIS — E1121 Type 2 diabetes mellitus with diabetic nephropathy: Secondary | ICD-10-CM | POA: Diagnosis not present

## 2014-09-11 DIAGNOSIS — Z794 Long term (current) use of insulin: Secondary | ICD-10-CM | POA: Diagnosis not present

## 2014-09-11 DIAGNOSIS — E1140 Type 2 diabetes mellitus with diabetic neuropathy, unspecified: Secondary | ICD-10-CM | POA: Diagnosis not present

## 2014-09-11 DIAGNOSIS — I129 Hypertensive chronic kidney disease with stage 1 through stage 4 chronic kidney disease, or unspecified chronic kidney disease: Secondary | ICD-10-CM | POA: Diagnosis not present

## 2014-09-20 ENCOUNTER — Other Ambulatory Visit: Payer: Self-pay

## 2014-09-20 MED ORDER — CLONIDINE HCL 0.3 MG PO TABS: ORAL_TABLET | ORAL | Status: DC

## 2014-09-20 NOTE — Telephone Encounter (Signed)
Patient called asking for rx called to [facility], getting mail order but needs some while waiting on mail order. Done

## 2014-09-21 DIAGNOSIS — Z23 Encounter for immunization: Secondary | ICD-10-CM | POA: Diagnosis not present

## 2014-09-30 ENCOUNTER — Other Ambulatory Visit: Payer: Self-pay | Admitting: Internal Medicine

## 2014-10-04 DIAGNOSIS — R739 Hyperglycemia, unspecified: Secondary | ICD-10-CM | POA: Diagnosis not present

## 2014-10-04 DIAGNOSIS — E1121 Type 2 diabetes mellitus with diabetic nephropathy: Secondary | ICD-10-CM | POA: Diagnosis not present

## 2014-10-04 DIAGNOSIS — N183 Chronic kidney disease, stage 3 (moderate): Secondary | ICD-10-CM | POA: Diagnosis not present

## 2014-10-04 DIAGNOSIS — I129 Hypertensive chronic kidney disease with stage 1 through stage 4 chronic kidney disease, or unspecified chronic kidney disease: Secondary | ICD-10-CM | POA: Diagnosis not present

## 2014-10-04 DIAGNOSIS — Z794 Long term (current) use of insulin: Secondary | ICD-10-CM | POA: Diagnosis not present

## 2014-10-04 DIAGNOSIS — Z79899 Other long term (current) drug therapy: Secondary | ICD-10-CM | POA: Diagnosis not present

## 2014-10-04 DIAGNOSIS — E1140 Type 2 diabetes mellitus with diabetic neuropathy, unspecified: Secondary | ICD-10-CM | POA: Diagnosis not present

## 2014-10-04 DIAGNOSIS — R0600 Dyspnea, unspecified: Secondary | ICD-10-CM | POA: Diagnosis not present

## 2014-10-04 DIAGNOSIS — R609 Edema, unspecified: Secondary | ICD-10-CM | POA: Diagnosis not present

## 2014-10-04 DIAGNOSIS — E1165 Type 2 diabetes mellitus with hyperglycemia: Secondary | ICD-10-CM | POA: Diagnosis not present

## 2014-10-19 ENCOUNTER — Other Ambulatory Visit: Payer: Self-pay | Admitting: Internal Medicine

## 2014-10-19 DIAGNOSIS — I674 Hypertensive encephalopathy: Secondary | ICD-10-CM | POA: Insufficient documentation

## 2014-10-19 HISTORY — DX: Hypertensive encephalopathy: I67.4

## 2014-10-22 DIAGNOSIS — Z794 Long term (current) use of insulin: Secondary | ICD-10-CM | POA: Diagnosis not present

## 2014-10-22 DIAGNOSIS — E1121 Type 2 diabetes mellitus with diabetic nephropathy: Secondary | ICD-10-CM | POA: Diagnosis not present

## 2014-10-22 DIAGNOSIS — R609 Edema, unspecified: Secondary | ICD-10-CM | POA: Diagnosis not present

## 2014-10-22 DIAGNOSIS — N183 Chronic kidney disease, stage 3 (moderate): Secondary | ICD-10-CM | POA: Diagnosis not present

## 2014-10-22 DIAGNOSIS — I129 Hypertensive chronic kidney disease with stage 1 through stage 4 chronic kidney disease, or unspecified chronic kidney disease: Secondary | ICD-10-CM | POA: Diagnosis not present

## 2014-10-30 ENCOUNTER — Encounter (HOSPITAL_COMMUNITY): Payer: Self-pay | Admitting: Emergency Medicine

## 2014-10-30 ENCOUNTER — Inpatient Hospital Stay (HOSPITAL_COMMUNITY)
Admission: EM | Admit: 2014-10-30 | Discharge: 2014-11-05 | DRG: 071 | Disposition: A | Discharge status: Skilled Nursing Facility | Payer: Medicare Other | Attending: Internal Medicine | Admitting: Internal Medicine

## 2014-10-30 ENCOUNTER — Emergency Department (HOSPITAL_COMMUNITY): Payer: Medicare Other

## 2014-10-30 DIAGNOSIS — Z7982 Long term (current) use of aspirin: Secondary | ICD-10-CM

## 2014-10-30 DIAGNOSIS — R4182 Altered mental status, unspecified: Secondary | ICD-10-CM | POA: Diagnosis not present

## 2014-10-30 DIAGNOSIS — Z951 Presence of aortocoronary bypass graft: Secondary | ICD-10-CM

## 2014-10-30 DIAGNOSIS — Z833 Family history of diabetes mellitus: Secondary | ICD-10-CM

## 2014-10-30 DIAGNOSIS — I440 Atrioventricular block, first degree: Secondary | ICD-10-CM

## 2014-10-30 DIAGNOSIS — R600 Localized edema: Secondary | ICD-10-CM

## 2014-10-30 DIAGNOSIS — E1129 Type 2 diabetes mellitus with other diabetic kidney complication: Secondary | ICD-10-CM | POA: Diagnosis present

## 2014-10-30 DIAGNOSIS — S0990XA Unspecified injury of head, initial encounter: Secondary | ICD-10-CM | POA: Diagnosis not present

## 2014-10-30 DIAGNOSIS — R778 Other specified abnormalities of plasma proteins: Secondary | ICD-10-CM | POA: Diagnosis present

## 2014-10-30 DIAGNOSIS — I129 Hypertensive chronic kidney disease with stage 1 through stage 4 chronic kidney disease, or unspecified chronic kidney disease: Secondary | ICD-10-CM | POA: Diagnosis present

## 2014-10-30 DIAGNOSIS — M6282 Rhabdomyolysis: Secondary | ICD-10-CM | POA: Diagnosis present

## 2014-10-30 DIAGNOSIS — R4701 Aphasia: Secondary | ICD-10-CM

## 2014-10-30 DIAGNOSIS — R21 Rash and other nonspecific skin eruption: Secondary | ICD-10-CM

## 2014-10-30 DIAGNOSIS — G9340 Encephalopathy, unspecified: Principal | ICD-10-CM | POA: Diagnosis present

## 2014-10-30 DIAGNOSIS — M6289 Other specified disorders of muscle: Secondary | ICD-10-CM

## 2014-10-30 DIAGNOSIS — I10 Essential (primary) hypertension: Secondary | ICD-10-CM

## 2014-10-30 DIAGNOSIS — I480 Paroxysmal atrial fibrillation: Secondary | ICD-10-CM | POA: Diagnosis present

## 2014-10-30 DIAGNOSIS — Z8546 Personal history of malignant neoplasm of prostate: Secondary | ICD-10-CM

## 2014-10-30 DIAGNOSIS — E1122 Type 2 diabetes mellitus with diabetic chronic kidney disease: Secondary | ICD-10-CM | POA: Diagnosis not present

## 2014-10-30 DIAGNOSIS — I25810 Atherosclerosis of coronary artery bypass graft(s) without angina pectoris: Secondary | ICD-10-CM | POA: Diagnosis not present

## 2014-10-30 DIAGNOSIS — N179 Acute kidney failure, unspecified: Secondary | ICD-10-CM | POA: Diagnosis present

## 2014-10-30 DIAGNOSIS — F17200 Nicotine dependence, unspecified, uncomplicated: Secondary | ICD-10-CM | POA: Diagnosis not present

## 2014-10-30 DIAGNOSIS — M1A9XX0 Chronic gout, unspecified, without tophus (tophi): Secondary | ICD-10-CM | POA: Diagnosis present

## 2014-10-30 DIAGNOSIS — W19XXXA Unspecified fall, initial encounter: Secondary | ICD-10-CM | POA: Diagnosis present

## 2014-10-30 DIAGNOSIS — I6789 Other cerebrovascular disease: Secondary | ICD-10-CM | POA: Diagnosis not present

## 2014-10-30 DIAGNOSIS — R531 Weakness: Secondary | ICD-10-CM | POA: Diagnosis not present

## 2014-10-30 DIAGNOSIS — E1121 Type 2 diabetes mellitus with diabetic nephropathy: Secondary | ICD-10-CM

## 2014-10-30 DIAGNOSIS — C61 Malignant neoplasm of prostate: Secondary | ICD-10-CM | POA: Diagnosis not present

## 2014-10-30 DIAGNOSIS — N183 Chronic kidney disease, stage 3 unspecified: Secondary | ICD-10-CM

## 2014-10-30 DIAGNOSIS — R7989 Other specified abnormal findings of blood chemistry: Secondary | ICD-10-CM

## 2014-10-30 DIAGNOSIS — I255 Ischemic cardiomyopathy: Secondary | ICD-10-CM | POA: Diagnosis present

## 2014-10-30 DIAGNOSIS — I252 Old myocardial infarction: Secondary | ICD-10-CM

## 2014-10-30 DIAGNOSIS — I159 Secondary hypertension, unspecified: Secondary | ICD-10-CM

## 2014-10-30 DIAGNOSIS — M109 Gout, unspecified: Secondary | ICD-10-CM | POA: Diagnosis present

## 2014-10-30 DIAGNOSIS — F17220 Nicotine dependence, chewing tobacco, uncomplicated: Secondary | ICD-10-CM | POA: Diagnosis present

## 2014-10-30 DIAGNOSIS — I160 Hypertensive urgency: Secondary | ICD-10-CM | POA: Diagnosis present

## 2014-10-30 DIAGNOSIS — Z8249 Family history of ischemic heart disease and other diseases of the circulatory system: Secondary | ICD-10-CM

## 2014-10-30 DIAGNOSIS — L89152 Pressure ulcer of sacral region, stage 2: Secondary | ICD-10-CM

## 2014-10-30 DIAGNOSIS — Z79899 Other long term (current) drug therapy: Secondary | ICD-10-CM

## 2014-10-30 DIAGNOSIS — E876 Hypokalemia: Secondary | ICD-10-CM

## 2014-10-30 DIAGNOSIS — Z794 Long term (current) use of insulin: Secondary | ICD-10-CM

## 2014-10-30 DIAGNOSIS — R001 Bradycardia, unspecified: Secondary | ICD-10-CM

## 2014-10-30 DIAGNOSIS — E785 Hyperlipidemia, unspecified: Secondary | ICD-10-CM

## 2014-10-30 DIAGNOSIS — Z0000 Encounter for general adult medical examination without abnormal findings: Secondary | ICD-10-CM

## 2014-10-30 LAB — COMPREHENSIVE METABOLIC PANEL
ALBUMIN: 4.4 g/dL (ref 3.5–5.0)
ALT: 28 U/L (ref 17–63)
AST: 42 U/L — AB (ref 15–41)
Alkaline Phosphatase: 68 U/L (ref 38–126)
Anion gap: 16 — ABNORMAL HIGH (ref 5–15)
BUN: 15 mg/dL (ref 6–20)
CHLORIDE: 98 mmol/L — AB (ref 101–111)
CO2: 24 mmol/L (ref 22–32)
CREATININE: 1.31 mg/dL — AB (ref 0.61–1.24)
Calcium: 9.3 mg/dL (ref 8.9–10.3)
GFR calc Af Amer: 57 mL/min — ABNORMAL LOW (ref >60)
GFR calc non Af Amer: 49 mL/min — ABNORMAL LOW (ref >60)
GLUCOSE: 254 mg/dL — AB (ref 65–99)
POTASSIUM: 3.2 mmol/L — AB (ref 3.5–5.1)
SODIUM: 138 mmol/L (ref 135–145)
Total Bilirubin: 1.3 mg/dL — ABNORMAL HIGH (ref 0.3–1.2)
Total Protein: 7.5 g/dL (ref 6.5–8.1)

## 2014-10-30 LAB — URINALYSIS W MICROSCOPIC (NOT AT ARMC)
BILIRUBIN URINE: NEGATIVE
Glucose, UA: >1000 mg/dL — AB
Ketones, ur: 40 mg/dL — AB
LEUKOCYTES UA: NEGATIVE
Nitrite: NEGATIVE
PH: 7 (ref 5.0–8.0)
Protein, ur: 100 mg/dL — AB
SPECIFIC GRAVITY, URINE: 1.014 (ref 1.005–1.030)
Urobilinogen, UA: 1 mg/dL (ref 0.0–1.0)

## 2014-10-30 LAB — APTT: aPTT: 26 seconds (ref 24–37)

## 2014-10-30 LAB — CBC WITH DIFFERENTIAL/PLATELET
BASOS ABS: 0 10*3/uL (ref 0.0–0.1)
BASOS PCT: 0 %
EOS PCT: 0 %
Eosinophils Absolute: 0 10*3/uL (ref 0.0–0.7)
HCT: 46.2 % (ref 39.0–52.0)
Hemoglobin: 15.7 g/dL (ref 13.0–17.0)
LYMPHS PCT: 6 %
Lymphs Abs: 0.8 10*3/uL (ref 0.7–4.0)
MCH: 30.9 pg (ref 26.0–34.0)
MCHC: 34 g/dL (ref 30.0–36.0)
MCV: 90.9 fL (ref 78.0–100.0)
MONO ABS: 0.3 10*3/uL (ref 0.1–1.0)
Monocytes Relative: 3 %
Neutro Abs: 11.5 10*3/uL — ABNORMAL HIGH (ref 1.7–7.7)
Neutrophils Relative %: 91 %
PLATELETS: 208 10*3/uL (ref 150–400)
RBC: 5.08 MIL/uL (ref 4.22–5.81)
RDW: 13 % (ref 11.5–15.5)
WBC: 12.6 10*3/uL — AB (ref 4.0–10.5)

## 2014-10-30 LAB — I-STAT CG4 LACTIC ACID, ED: LACTIC ACID, VENOUS: 2.15 mmol/L — AB (ref 0.5–2.0)

## 2014-10-30 LAB — PROTIME-INR
INR: 1.04 (ref 0.00–1.49)
PROTHROMBIN TIME: 13.8 s (ref 11.6–15.2)

## 2014-10-30 LAB — CK: CK TOTAL: 531 U/L — AB (ref 49–397)

## 2014-10-30 LAB — I-STAT TROPONIN, ED: TROPONIN I, POC: 0.06 ng/mL (ref 0.00–0.08)

## 2014-10-30 MED ORDER — LIDOCAINE-EPINEPHRINE (PF) 2 %-1:200000 IJ SOLN
20.0000 mL | Freq: Once | INTRAMUSCULAR | Status: DC
Start: 2014-10-30 — End: 2014-10-31
  Filled 2014-10-30: qty 20

## 2014-10-30 MED ORDER — SODIUM CHLORIDE 0.9 % IV BOLUS (SEPSIS)
1000.0000 mL | Freq: Once | INTRAVENOUS | Status: AC
  Administered 2014-10-30: 1000 mL via INTRAVENOUS

## 2014-10-30 MED ORDER — DEXTROSE 5 % IV SOLN
800.0000 mg | Freq: Once | INTRAVENOUS | Status: AC
  Administered 2014-10-31: 800 mg via INTRAVENOUS
  Filled 2014-10-30: qty 16

## 2014-10-30 MED ORDER — LORAZEPAM 2 MG/ML IJ SOLN: 0.5000 mg | Freq: Once | INTRAMUSCULAR | Status: DC

## 2014-10-30 MED ORDER — LABETALOL HCL 5 MG/ML IV SOLN
10.0000 mg | Freq: Once | INTRAVENOUS | Status: AC
  Administered 2014-10-30: 10 mg via INTRAVENOUS
  Filled 2014-10-30: qty 4

## 2014-10-30 MED ORDER — LEVETIRACETAM 500 MG/5ML IV SOLN
1000.0000 mg | Freq: Once | INTRAVENOUS | Status: AC
  Administered 2014-10-31: 1000 mg via INTRAVENOUS
  Filled 2014-10-30: qty 10

## 2014-10-30 MED ORDER — LORAZEPAM 2 MG/ML IJ SOLN
0.5000 mg | Freq: Once | INTRAMUSCULAR | Status: DC
  Filled 2014-10-30: qty 1

## 2014-10-30 NOTE — ED Provider Notes (Signed)
CSN: [id number]     Arrival date & time 10/30/14  1906 History   First MD Initiated Contact with Patient 10/30/14 1908     Chief Complaint  Patient presents with  . Altered Mental Status  . Fall  . stroke symptoms      (Consider location/radiation/quality/duration/timing/severity/associated sxs/prior Treatment) HPI 81 year old male who presents with altered mental status. History of hypertension, hyperlipidemia, CAD and diabetes. History is provided by EMS and the patient's family. States that typically he is very independent and with normal mental status. His neighbor saw him at around noon today and was noted to be his normal self. The family had difficulty getting a hold of him afterwards, and the patient was found on the floor minimally responsive at around 6 PM. He had a point-of-care glucose by EMS at around 200. He was noted to have left gaze preference, and refusing to move his right side.  Family states that he has otherwise been in his usual state of health recently. They deny that he has had any recent illnesses including cough, fever, nausea, vomiting, diarrhea. He has not been complaining of pain.  Past Medical History  Diagnosis Date  . Type II or unspecified type diabetes mellitus without mention of complication, uncontrolled   . Other and unspecified hyperlipidemia   . Gout, unspecified   . Unspecified essential hypertension   . Type II or unspecified type diabetes mellitus with renal manifestations, uncontrolled   . 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 Gout, unspecified   . Type II or unspecified type diabetes mellitus without mention of complication, uncontrolled   . First degree atrioventricular block   . Malignant neoplasm of prostate ([place])   . Malignant neoplasm of prostate ([place])   . Type II or unspecified type diabetes mellitus without mention of complication, not stated as uncontrolled   . Other and unspecified hyperlipidemia   .  Unspecified tinnitus   . Unspecified essential hypertension   . Old myocardial infarction   . Coronary atherosclerosis of unspecified type of vessel, native or graft   . Edema   . Undiagnosed cardiac murmurs   . Undiagnosed cardiac murmurs    Past Surgical History  Procedure Laterality Date  . Appendectomy    . Prostate surgery    . Coronary artery bypass graft    . Eye surgery  2014   Family History  Problem Relation Age of Onset  . Diabetes Mother   . Heart disease Father   . Cancer Sister     lung  . Cancer Sister     lung   Social History  Substance Use Topics  . Smoking status: Never Smoker   . Smokeless tobacco: Current User    Types: Chew  . Alcohol Use: No    Review of Systems  Unable to perform ROS: Patient nonverbal      Allergies  Review of patient's allergies indicates no known allergies.  Home Medications   Prior to Admission medications   Medication Sig Start Date End Date Taking? Authorizing Provider  allopurinol (ZYLOPRIM) 300 MG tablet TAKE 1 TABLET DAILY TO PREVENT GOUT ATTACK Patient taking differently: TAKE 1/2 TABLET DAILY TO PREVENT GOUT ATTACK, take another 1/2 tablet daily if having a gout attack 02/11/14  Yes Blanchie Serve, MD  aspirin 325 MG EC tablet Take 325 mg by mouth daily.   Yes Historical Provider, MD  cloNIDine (CATAPRES) 0.3 MG tablet Take one tablet by mouth twice daily for blood  pressure 09/20/14  Yes [name], DO  furosemide (LASIX) 40 MG tablet Take 40 mg by mouth daily at 12 noon.  10/22/14  Yes Historical Provider, MD  insulin aspart (NOVOLOG) 100 UNIT/ML injection Inject 6-10 Units into the skin 3 (three) times daily with meals. 6 unit for blood sugar 150-250 and 10 u for blood sugar 250-350 02/19/14  Yes Mahima Pandey, MD  Insulin Detemir (LEVEMIR FLEXPEN) 100 UNIT/ML Pen Inject 38 units once daily to control blood sugar *Please send 90 day supply* 02/19/14  Yes Blanchie Serve, MD  metoprolol tartrate (LOPRESSOR) 25 MG tablet  Take 1/2 tablet by mouth twice daily to control heart rhythm 02/26/14  Yes Blanchie Serve, MD  Multiple Vitamin (MULTIVITAMIN) tablet Take 1 tablet by mouth daily.   Yes Historical Provider, MD  potassium chloride SA (K-DUR,KLOR-CON) 20 MEQ tablet TAKE 1 TABLET DAILY. FOR LOW POTASSIUM 10/21/14  Yes [name], DO  ramipril (ALTACE) 10 MG capsule Take one capsule by mouth twice daily to control blood pressure 02/26/14  Yes Mahima Pandey, MD  simvastatin (ZOCOR) 20 MG tablet APPOINTMENT OVERDUE, 1 by mouth daily to lower cholesterol 10/01/14  Yes [name], DO  amLODipine (NORVASC) 10 MG tablet TAKE 1 TABLET DAILY FOR BLOOD PRESSURE Patient not taking: Reported on 10/30/2014 02/19/14   Estill Dooms, MD  furosemide (LASIX) 20 MG tablet TAKE 1 TABLET DAILY Patient not taking: Reported on 10/30/2014 07/29/14   [name], DO  hydrochlorothiazide (HYDRODIURIL) 50 MG tablet Take one tablet by mouth once daily for blood pressure Patient not taking: Reported on 10/30/2014 09/11/13   Estill Dooms, MD  Insulin Pen Needle (B-D ULTRAFINE III SHORT PEN) 31G X 8 MM MISC Use 2 pen needles daily with the administration of Insulin. DX 250.00 06/06/13   Mahima Bubba Camp, MD  LEVEMIR FLEXTOUCH 100 UNIT/ML Pen INJECT 35 UNITS ONCE DAILY TO CONTROL BLOOD SUGAR Patient not taking: Reported on 10/30/2014 07/10/14   [name], DO  NOVOLOG FLEXPEN 100 UNIT/ML FlexPen INJECT 6 UNITS INTO THE SKIN THREE TIMES A DAY WITH MEALS Patient not taking: Reported on 10/30/2014 07/10/14   [name], DO   BP 161/79 mmHg  Pulse 110  Resp 22  SpO2 90% Physical Exam Physical Exam  Nursing note and vitals reviewed. Constitutional: Elderly man, well nourished, awake but non-verbal, non-toxic and in no acute distress Head: Normocephalic and atraumatic.  Mouth/Throat: Oropharynx is clear and moist.  Neck: Cervical collar in place. Cardiovascular: Tachycardic rate and regular rhythm.  Symmetric radial pulses  bilaterally. Pulmonary/Chest: Effort normal and breath sounds normal.  Abdominal: Soft. There is no tenderness. There is no rebound and no guarding.  Musculoskeletal: No deformities or swelling.  Neurological: Alert, not verbal, eyes open spontaneously, does not localize pain but moves LUE and bilateral LE to command. Does not move RUE to command or spontaneously. GCS 11 Skin: Skin is warm and dry.  Psychiatric: Cooperative  ED Course  .Lumbar Puncture Date/Time: 10/31/2014 12:21 AM Performed by: Brantley Stage DUO Authorized by: Brantley Stage DUO Consent: The procedure was performed in an emergent situation. Verbal consent obtained. Risks and benefits: risks, benefits and alternatives were discussed Consent given by: spouse Time out: Immediately prior to procedure a "time out" was called to verify the correct patient, procedure, equipment, support staff and site/side marked as required. Indications: evaluation for infection and evaluation for altered mental status Anesthesia: local infiltration Local anesthetic: lidocaine 2% with epinephrine and lidocaine 1% without epinephrine Anesthetic total: 10 ml Patient  sedated: no Preparation: Patient was prepped and draped in the usual sterile fashion. Lumbar space: L3-L4 interspace Patient's position: left lateral decubitus Needle gauge: 18 Needle type: spinal needle - Quincke tip Needle length: 3.5 in Number of attempts: 3 Opening pressure: 23 cm H2O Fluid appearance: clear Tubes of fluid: 4 Total volume: 9 ml Post-procedure: site cleaned and adhesive bandage applied Patient tolerance: Patient tolerated the procedure well with no immediate complications   (including critical care time) Labs Review Labs Reviewed  CBC WITH DIFFERENTIAL/PLATELET - Abnormal; Notable for the following:    WBC 12.6 (*)    Neutro Abs 11.5 (*)    All other components within normal limits  COMPREHENSIVE METABOLIC PANEL - Abnormal; Notable for the following:     Potassium 3.2 (*)    Chloride 98 (*)    Glucose, Bld 254 (*)    Creatinine, Ser 1.31 (*)    AST 42 (*)    Total Bilirubin 1.3 (*)    GFR calc non Af Amer 49 (*)    GFR calc Af Amer 57 (*)    Anion gap 16 (*)    All other components within normal limits  CK - Abnormal; Notable for the following:    Total CK 531 (*)    All other components within normal limits  URINALYSIS W MICROSCOPIC - Abnormal; Notable for the following:    Glucose, UA >1000 (*)    Hgb urine dipstick LARGE (*)    Ketones, ur 40 (*)    Protein, ur 100 (*)    Casts HYALINE CASTS (*)    All other components within normal limits  I-STAT CG4 LACTIC ACID, ED - Abnormal; Notable for the following:    Lactic Acid, Venous 2.15 (*)    All other components within normal limits  CSF CULTURE  FUNGUS CULTURE W SMEAR  PROTIME-INR  APTT  CSF CELL COUNT WITH DIFFERENTIAL  CSF CELL COUNT WITH DIFFERENTIAL  PROTEIN AND GLUCOSE, CSF  HERPES SIMPLEX VIRUS(HSV) DNA BY PCR  HSV(HERPES SMPLX VRS)ABS-I+II(IGG)-CSF  CBG MONITORING, ED  I-STAT TROPOININ, ED  I-STAT CG4 LACTIC ACID, ED    Imaging Review Dg Chest 2 View  10/30/2014  CLINICAL DATA:  Altered mental status EXAM: CHEST  2 VIEW COMPARISON:  February 03, 2006 FINDINGS: There is no edema or consolidation. Heart is upper normal in size with pulmonary vascularity within normal limits. Patient is status post coronary artery bypass grafting. There is atherosclerotic change in the aorta. No adenopathy. No pneumothorax. No bone lesions. IMPRESSION: No edema or consolidation. Electronically Signed   By: Lowella Grip III M.D.   On: 10/30/2014 19:50   Ct Head Wo Contrast  10/30/2014  CLINICAL DATA:  81 year old male with altered mental status, found down. EXAM: CT HEAD WITHOUT CONTRAST CT CERVICAL SPINE WITHOUT CONTRAST TECHNIQUE: Multidetector CT imaging of the head and cervical spine was performed following the standard protocol without intravenous contrast. Multiplanar CT  image reconstructions of the cervical spine were also generated. COMPARISON:  None. FINDINGS: CT HEAD FINDINGS Atrophy, chronic small-vessel white matter ischemic changes and small remote bilateral cerebellar infarcts noted. No acute intracranial abnormalities are identified, including mass lesion or mass effect, hydrocephalus, extra-axial fluid collection, midline shift, hemorrhage, or acute infarction. The visualized bony calvarium is unremarkable. CT CERVICAL SPINE FINDINGS Normal alignment noted. There is no evidence of acute fracture, subluxation or prevertebral soft tissue swelling. Moderate to severe degenerative disc disease and spondylosis from C2-C6 identified contributing to moderate central spinal and bony  foraminal narrowing. No focal bony lesions are identified. The soft tissue structures and lung apices are unremarkable. IMPRESSION: No evidence of acute intracranial abnormality. Atrophy, chronic small-vessel white matter ischemic changes and small remote bilateral cerebellar infarcts. No static evidence of acute injury to the cervical spine. Moderate to severe degenerative changes from C2-C6 contributing to moderate central spinal and foraminal narrowing. Electronically Signed   By: Margarette Canada M.D.   On: 10/30/2014 19:52   Ct Cervical Spine Wo Contrast  10/30/2014  CLINICAL DATA:  81 year old male with altered mental status, found down. EXAM: CT HEAD WITHOUT CONTRAST CT CERVICAL SPINE WITHOUT CONTRAST TECHNIQUE: Multidetector CT imaging of the head and cervical spine was performed following the standard protocol without intravenous contrast. Multiplanar CT image reconstructions of the cervical spine were also generated. COMPARISON:  None. FINDINGS: CT HEAD FINDINGS Atrophy, chronic small-vessel white matter ischemic changes and small remote bilateral cerebellar infarcts noted. No acute intracranial abnormalities are identified, including mass lesion or mass effect, hydrocephalus, extra-axial  fluid collection, midline shift, hemorrhage, or acute infarction. The visualized bony calvarium is unremarkable. CT CERVICAL SPINE FINDINGS Normal alignment noted. There is no evidence of acute fracture, subluxation or prevertebral soft tissue swelling. Moderate to severe degenerative disc disease and spondylosis from C2-C6 identified contributing to moderate central spinal and bony foraminal narrowing. No focal bony lesions are identified. The soft tissue structures and lung apices are unremarkable. IMPRESSION: No evidence of acute intracranial abnormality. Atrophy, chronic small-vessel white matter ischemic changes and small remote bilateral cerebellar infarcts. No static evidence of acute injury to the cervical spine. Moderate to severe degenerative changes from C2-C6 contributing to moderate central spinal and foraminal narrowing. Electronically Signed   By: Margarette Canada M.D.   On: 10/30/2014 19:52   Mr Brain Wo Contrast  10/30/2014  CLINICAL DATA:  Found down on kitchen floor, last seen normal at noon today. Aphasic, LEFT-sided gaze. History of hypertension, diabetes, prostate cancer. EXAM: MRI HEAD WITHOUT CONTRAST TECHNIQUE: Multiplanar, multiecho pulse sequences of the brain and surrounding structures were obtained without intravenous contrast. COMPARISON:  CT head October 30, 2014 at 1928 hours FINDINGS: No reduced diffusion to suggest acute ischemia. Punctate foci of susceptibility artifact LEFT cerebellum, RIGHT frontal lobe are nonspecific. O bilateral small cerebellar infarcts. Old RIGHT basal ganglia lacunar infarct. Ventricles are normal for age, mild sulcal effacement at the convexities. Patchy supratentorial white matter T2 hyperintensities without midline shift, mass effect or mass lesions. No abnormal extra-axial fluid collections. Normal major intracranial vascular flow voids seen at the skull base. Status post bilateral ocular lens implants. Small LEFT maxillary mucosal retention cyst without  paranasal sinus air-fluid levels. Old LEFT medial orbital blowout fracture. Trace LEFT mastoid effusion. No abnormal sellar expansion. No cerebellar tonsillar ectopia. Patient is edentulous. IMPRESSION: No acute intracranial process. Involutional changes, superimposed component of suspected normal pressure hydrocephalus. Moderate white matter changes can be seen with chronic small vessel ischemic disease. 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 Electronically Signed   By: Elon Alas M.D.   On: 10/30/2014 22:32   I have personally reviewed and evaluated these images and lab results as part of my medical decision-making.   EKG Interpretation   Date/Time:  Wednesday October 30 2014 20:01:52 EDT Ventricular Rate:  126 PR Interval:  103 QRS Duration: 85 QT Interval:  407 QTC Calculation: 589 R Axis:   56 Text Interpretation:  Right sided EKG NO evidence of STE in posterior  leads. TWI in V2-V3 Sinus  or ectopic atrial tachycardia Abnormal R-wave  progression, early transition Borderline repolarization abnormality  Prolonged QT interval Confirmed by LIU MD, Hinton Dyer ([id number]) on 10/30/2014  10:28:45 PM      MDM   Final diagnoses:  Encephalopathy acute   CRITICAL CARE Performed by: Forde Dandy   Total critical care time: 30 minutes  Critical care time was exclusive of separately billable procedures and treating other patients.  Critical care was necessary to treat or prevent imminent or life-threatening deterioration.  Critical care was time spent personally by me on the following activities: development of treatment plan with patient and/or surrogate as well as nursing, discussions with consultants, evaluation of patient's response to treatment, examination of patient, obtaining history from patient or surrogate, ordering and performing treatments and interventions, ordering and review of laboratory studies, ordering and review of radiographic  studies, pulse oximetry and re-evaluation of patient's condition.       81 year old male who presents with altered mental status. On arrival, he is awake, with left gaze preference. Moves left upper extremity and bilateral lower extremities to command and spontaneously but will not move the right upper extremity. He is hypertensive with systolic blood pressures in the 220s, tachycardic in the 110s, and afebrile. CT head and cervical spine obtained and visualized. No evidence of acute intracranial processes and negative cervical spine trauma. Cervical collar is cleared. Given his hypertension with focal neurological deficits MRI was also performed. Neurology's consultation 12 this is pending. MR visualized and shows no acute intracranial processes that could explain his symptoms. No major left side or metabolic processes are noted. No evidence of infection on urinalysis or chest x-ray.  At this time it is unclear what the etiology is of his altered mental status. Given the focal nature of his neuro findings, also there is concern for possible encephalitis. LP is performed and CSF studies are currently pending. He is empirically covered with acyclovir while HSV studies are pending from his CSF. We will treat him for meningitis as his CSF studies are suggestive of this. Also unclear if he may have had a seizure with Todd paralysis. Was empirically covered with a gram of Keppra per neurology recommendations. I discussed with [facility] hospitalist admitted to stepdown for ongoing management.    Forde Dandy, MD 10/31/14 [phone]

## 2014-10-30 NOTE — ED Notes (Signed)
Called dr. Viviana Simpler to report patient is prepared for lumpar puncture, consent obtained.

## 2014-10-30 NOTE — ED Notes (Signed)
Dr. Liu at bedside 

## 2014-10-30 NOTE — ED Notes (Signed)
Received call from MRI, patient not laying still. Discussed with dr. Viviana Simpler and dr. Doy Mince. Verbal order for 0.5mg  of ativan, and if needed, repeat dose per dr. Doy Mince.

## 2014-10-30 NOTE — ED Notes (Signed)
Reported bp 215/122 to Dr. Viviana Simpler, MD acknowledges, no new orders for bp management as patient is being ruled out for acute tia.

## 2014-10-30 NOTE — ED Notes (Signed)
Patient still off unit for mri, neurologist reports seeing patient in MRI.

## 2014-10-30 NOTE — ED Notes (Signed)
Reported bp 221/118 to Dr. Donna Bernard.

## 2014-10-30 NOTE — ED Notes (Addendum)
Pt from home via [place] after being found in the kitchen floor face down with arm pressed up against the refridgerator.  Pts daughter went to check on pt after no being able to reach him this afternoon.  A family friend told the daughter they stopped by the house around noon today and the pt was normal.  Pt is normally unsteady on his feet but ambulatory and drives, lives an active life style, and lives alone.  Pt mute, able to squeeze his left hand yes and follow commands on left, pt able to wiggle left toes, unable to move right toes or squeeze right hand, left sided gaze.  When EMS asked pt if he was SOB he squeezed yes.  EMS reports rales rt upper lungs and that pt uses oral tobacco.  Manual BP of 280/150. Hx of DM and MI.

## 2014-10-30 NOTE — ED Notes (Signed)
Dr. Viviana Simpler called to the bedside to inquire about CT needed.

## 2014-10-30 NOTE — ED Notes (Signed)
Dr. Doy Mince at the bedside to update on plan of care.

## 2014-10-30 NOTE — Consult Note (Signed)
Referring Physician: Oleta Mouse    Chief Complaint: Nonverbal, right sided weakness  HPI: John Romero is an 81 y.o. male who at baseline lives alone and manages his own affairs.  Saw his neighbor at lunchtime and was at his baseline.  His daughter called him sometime this afternoon and he did not answer the phone.  When she arrived at his house after work she found him down.  He was unable to get up.  He was not speaking.  EMS was called and the patient was brought in for evaluation.   Date last known well: Date: 10/30/2014 Time last known well: Time: 12:00 tPA Given: No: Outside time window  MRankin: 0  Past Medical History  Diagnosis Date  . Type II or unspecified type diabetes mellitus without mention of complication, uncontrolled   . Other and unspecified hyperlipidemia   . Gout, unspecified   . Unspecified essential hypertension   . Type II or unspecified type diabetes mellitus with renal manifestations, uncontrolled   . 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 Gout, unspecified   . Type II or unspecified type diabetes mellitus without mention of complication, uncontrolled   . First degree atrioventricular block   . Malignant neoplasm of prostate ([place])   . Malignant neoplasm of prostate ([place])   . Type II or unspecified type diabetes mellitus without mention of complication, not stated as uncontrolled   . Other and unspecified hyperlipidemia   . Unspecified tinnitus   . Unspecified essential hypertension   . Old myocardial infarction   . Coronary atherosclerosis of unspecified type of vessel, native or graft   . Edema   . Undiagnosed cardiac murmurs   . Undiagnosed cardiac murmurs     Past Surgical History  Procedure Laterality Date  . Appendectomy    . Prostate surgery    . Coronary artery bypass graft    . Eye surgery  2014    Family History  Problem Relation Age of Onset  . Diabetes Mother   . Heart disease Father   . Cancer Sister     lung   . Cancer Sister     lung   Social History:  reports that he has never smoked. His smokeless tobacco use includes Chew. He reports that he does not drink alcohol or use illicit drugs.  Allergies: No Known Allergies  Medications: I have reviewed the patient's current medications. Prior to Admission:  Prior to Admission medications   Medication Sig Start Date End Date Taking? Authorizing Provider  allopurinol (ZYLOPRIM) 300 MG tablet TAKE 1 TABLET DAILY TO PREVENT GOUT ATTACK 02/11/14  Yes Mahima Pandey, MD  cloNIDine (CATAPRES) 0.3 MG tablet Take one tablet by mouth twice daily for blood pressure Patient taking differently: Take 0.3 mg by mouth 2 (two) times daily. Take one tablet by mouth twice daily for blood pressure 09/20/14  Yes [name], DO  furosemide (LASIX) 40 MG tablet Take 60 mg by mouth daily at 12 noon. 10/22/14  Yes Historical Provider, MD  metoprolol tartrate (LOPRESSOR) 25 MG tablet Take 1/2 tablet by mouth twice daily to control heart rhythm Patient taking differently: Take 25 mg by mouth 2 (two) times daily.  02/26/14  Yes Blanchie Serve, MD  Multiple Vitamin (MULTIVITAMIN) tablet Take 1 tablet by mouth daily.   Yes Historical Provider, MD  potassium chloride SA (K-DUR,KLOR-CON) 20 MEQ tablet TAKE 1 TABLET DAILY. FOR LOW POTASSIUM 10/21/14  Yes [name], DO  ramipril (ALTACE) 10 MG  capsule Take one capsule by mouth twice daily to control blood pressure Patient taking differently: Take 10 mg by mouth 2 (two) times daily. Take one capsule by mouth twice daily to control blood pressure 02/26/14  Yes Mahima Bubba Camp, MD  simvastatin (ZOCOR) 20 MG tablet APPOINTMENT OVERDUE, 1 by mouth daily to lower cholesterol Patient taking differently: Take 20 mg by mouth daily at 6 PM.  10/01/14  Yes [name], DO  amLODipine (NORVASC) 10 MG tablet TAKE 1 TABLET DAILY FOR BLOOD PRESSURE Patient not taking: Reported on 10/30/2014 02/19/14   Estill Dooms, MD  aspirin 325 MG EC tablet Take  325 mg by mouth daily.    Historical Provider, MD  furosemide (LASIX) 20 MG tablet TAKE 1 TABLET DAILY Patient not taking: Reported on 10/30/2014 07/29/14   [name], DO  hydrochlorothiazide (HYDRODIURIL) 50 MG tablet Take one tablet by mouth once daily for blood pressure Patient not taking: Reported on 10/30/2014 09/11/13   Estill Dooms, MD  insulin aspart (NOVOLOG) 100 UNIT/ML injection Inject 6-10 Units into the skin 3 (three) times daily with meals. 6 unit for blood sugar 150-250 and 10 u for blood sugar 250-350 02/19/14   Blanchie Serve, MD  Insulin Detemir (LEVEMIR FLEXPEN) 100 UNIT/ML Pen Inject 38 units once daily to control blood sugar *Please send 90 day supply* 02/19/14   Blanchie Serve, MD  Insulin Pen Needle (B-D ULTRAFINE III SHORT PEN) 31G X 8 MM MISC Use 2 pen needles daily with the administration of Insulin. DX 250.00 06/06/13   Mahima Bubba Camp, MD  LEVEMIR FLEXTOUCH 100 UNIT/ML Pen INJECT 35 UNITS ONCE DAILY TO CONTROL BLOOD SUGAR 07/10/14   [name], DO  NOVOLOG FLEXPEN 100 UNIT/ML FlexPen INJECT 6 UNITS INTO THE SKIN THREE TIMES A DAY WITH MEALS 07/10/14   [name], DO    ROS: History obtained from daughter  General ROS: negative for - chills, fatigue, fever, night sweats, weight gain or weight loss Psychological ROS: negative for - behavioral disorder, hallucinations, memory difficulties, mood swings or suicidal ideation Ophthalmic ROS: negative for - blurry vision, double vision, eye pain or loss of vision ENT ROS: negative for - epistaxis, nasal discharge, oral lesions, sore throat, tinnitus or vertigo Allergy and Immunology ROS: negative for - hives or itchy/watery eyes Hematological and Lymphatic ROS: negative for - bleeding problems, bruising or swollen lymph nodes Endocrine ROS: negative for - galactorrhea, hair pattern changes, polydipsia/polyuria or temperature intolerance Respiratory ROS: negative for - cough, hemoptysis, shortness of breath or  wheezing Cardiovascular ROS: BLE edema Gastrointestinal ROS: negative for - abdominal pain, diarrhea, hematemesis, nausea/vomiting or stool incontinence Genito-Urinary ROS: negative for - dysuria, hematuria, incontinence or urinary frequency/urgency Musculoskeletal ROS: negative for - joint swelling or muscular weakness Neurological ROS: as noted in HPI, gait instability Dermatological ROS: negative for rash and skin lesion changes  Physical Examination: Blood pressure 215/122, pulse 107, resp. rate 17, SpO2 92 %.  HEENT-  Normocephalic, no lesions, without obvious abnormality.  Normal external eye and conjunctiva.  Normal TM's bilaterally.  Normal auditory canals and external ears. Normal external nose, mucus membranes and septum.  Normal pharynx. Cardiovascular- S1, S2 normal, pulses palpable throughout   Lungs- chest clear, no wheezing, rales, normal symmetric air entry Abdomen- soft, non-tender; bowel sounds normal; no masses,  no organomegaly Extremities- BLE edema Lymph-no adenopathy palpable Musculoskeletal-no joint tenderness, deformity or swelling Skin-ecchymotic left toenail  Neurological Examination Mental Status: Alert.  No speech.  Does not follow commands.  Cranial Nerves: II: Discs flat bilaterally; Does not blink to confrontation from the right, pupils equal, round, reactive to light and accommodation III,IV, VI: ptosis not present, with oculocephalic maneuver unable to go beyond midline to the right V,VII: right facial droop VIII: unable to test IX,X: gag reflex reduced XI: unable to test XII: unable to test Motor: When right arm and leg lifted patient unable to maintain and goes back to the bed. Maintains left arm above head.  Flexes and extends LLE and moves spontaneously more than the right Sensory: Responds to noxious stimuli throughout Deep Tendon Reflexes: 2+ with absent AJ's bilaterally Plantars: Right: upgoing   Left: upgoing Cerebellar: Unable to  perform due to ability to follow commands Gait: not tested due to safety concerns   Laboratory Studies:  Basic Metabolic Panel:  Recent Labs Lab 10/30/14 1950  NA 138  K 3.2*  CL 98*  CO2 24  GLUCOSE 254*  BUN 15  CREATININE 1.31*  CALCIUM 9.3    Liver Function Tests:  Recent Labs Lab 10/30/14 1950  AST 42*  ALT 28  ALKPHOS 68  BILITOT 1.3*  PROT 7.5  ALBUMIN 4.4   No results for input(s): LIPASE, AMYLASE in the last 168 hours. No results for input(s): AMMONIA in the last 168 hours.  CBC:  Recent Labs Lab 10/30/14 1950  WBC 12.6*  NEUTROABS 11.5*  HGB 15.7  HCT 46.2  MCV 90.9  PLT 208    Cardiac Enzymes:  Recent Labs Lab 10/30/14 1950  CKTOTAL 531*    BNP: Invalid input(s): POCBNP  CBG: No results for input(s): GLUCAP in the last 168 hours.  Microbiology: No results found for this or any previous visit.  Coagulation Studies:  Recent Labs  10/30/14 1950  LABPROT 13.8  INR 1.04    Urinalysis:   Recent Labs Lab 10/30/14 1935  COLORURINE YELLOW  LABSPEC 1.014  PHURINE 7.0  GLUCOSEU >1000*  HGBUR LARGE*  BILIRUBINUR NEGATIVE  KETONESUR 40*  PROTEINUR 100*  UROBILINOGEN 1.0  NITRITE NEGATIVE  LEUKOCYTESUR NEGATIVE    Lipid Panel:    Component Value Date/Time   CHOL 153 10/15/2013 0804   TRIG 125 10/15/2013 0804   HDL 48 10/15/2013 0804   CHOLHDL 3.2 10/15/2013 0804   LDLCALC 80 10/15/2013 0804    HgbA1C:  Lab Results  Component Value Date   HGBA1C 9.2* 02/15/2014    Urine Drug Screen:  No results found for: LABOPIA, COCAINSCRNUR, LABBENZ, AMPHETMU, THCU, LABBARB  Alcohol Level: No results for input(s): ETH in the last 168 hours.  Other results: EKG: sinus tachycardia at 126 bpm.  Imaging: Dg Chest 2 View  10/30/2014  CLINICAL DATA:  Altered mental status EXAM: CHEST  2 VIEW COMPARISON:  February 03, 2006 FINDINGS: There is no edema or consolidation. Heart is upper normal in size with pulmonary vascularity  within normal limits. Patient is status post coronary artery bypass grafting. There is atherosclerotic change in the aorta. No adenopathy. No pneumothorax. No bone lesions. IMPRESSION: No edema or consolidation. Electronically Signed   By: Lowella Grip III M.D.   On: 10/30/2014 19:50   Ct Head Wo Contrast  10/30/2014  CLINICAL DATA:  81 year old male with altered mental status, found down. EXAM: CT HEAD WITHOUT CONTRAST CT CERVICAL SPINE WITHOUT CONTRAST TECHNIQUE: Multidetector CT imaging of the head and cervical spine was performed following the standard protocol without intravenous contrast. Multiplanar CT image reconstructions of the cervical spine were also generated. COMPARISON:  None. FINDINGS: CT HEAD  FINDINGS Atrophy, chronic small-vessel white matter ischemic changes and small remote bilateral cerebellar infarcts noted. No acute intracranial abnormalities are identified, including mass lesion or mass effect, hydrocephalus, extra-axial fluid collection, midline shift, hemorrhage, or acute infarction. The visualized bony calvarium is unremarkable. CT CERVICAL SPINE FINDINGS Normal alignment noted. There is no evidence of acute fracture, subluxation or prevertebral soft tissue swelling. Moderate to severe degenerative disc disease and spondylosis from C2-C6 identified contributing to moderate central spinal and bony foraminal narrowing. No focal bony lesions are identified. The soft tissue structures and lung apices are unremarkable. IMPRESSION: No evidence of acute intracranial abnormality. Atrophy, chronic small-vessel white matter ischemic changes and small remote bilateral cerebellar infarcts. No static evidence of acute injury to the cervical spine. Moderate to severe degenerative changes from C2-C6 contributing to moderate central spinal and foraminal narrowing. Electronically Signed   By: Margarette Canada M.D.   On: 10/30/2014 19:52   Ct Cervical Spine Wo Contrast  10/30/2014  CLINICAL DATA:   81 year old male with altered mental status, found down. EXAM: CT HEAD WITHOUT CONTRAST CT CERVICAL SPINE WITHOUT CONTRAST TECHNIQUE: Multidetector CT imaging of the head and cervical spine was performed following the standard protocol without intravenous contrast. Multiplanar CT image reconstructions of the cervical spine were also generated. COMPARISON:  None. FINDINGS: CT HEAD FINDINGS Atrophy, chronic small-vessel white matter ischemic changes and small remote bilateral cerebellar infarcts noted. No acute intracranial abnormalities are identified, including mass lesion or mass effect, hydrocephalus, extra-axial fluid collection, midline shift, hemorrhage, or acute infarction. The visualized bony calvarium is unremarkable. CT CERVICAL SPINE FINDINGS Normal alignment noted. There is no evidence of acute fracture, subluxation or prevertebral soft tissue swelling. Moderate to severe degenerative disc disease and spondylosis from C2-C6 identified contributing to moderate central spinal and bony foraminal narrowing. No focal bony lesions are identified. The soft tissue structures and lung apices are unremarkable. IMPRESSION: No evidence of acute intracranial abnormality. Atrophy, chronic small-vessel white matter ischemic changes and small remote bilateral cerebellar infarcts. No static evidence of acute injury to the cervical spine. Moderate to severe degenerative changes from C2-C6 contributing to moderate central spinal and foraminal narrowing. Electronically Signed   By: Margarette Canada M.D.   On: 10/30/2014 19:52    Assessment: 81 y.o. male presenting after being found down, mute with right sided weakness and RHH.   Patient with multiple vascular risk factors.  On ASA at home.  Head CT personally reviewed and shows no acute changes.  Concern remains for an acute infarct.  Further work up recommended.    Stroke Risk Factors - diabetes mellitus, hyperlipidemia and hypertension  Plan: 1. MRI of the brain without  contrast  Alexis Goodell, MD [facility] [phone] 10/30/2014, 10:07 PM  Addendum: MRI of the brain personally reviewed and shows no acute changes.  Patient is still not at baseline.  Patient now less alert.  White blood cell count is elevated. Infection and seizure are on the differential.   Recommendations: 1.  LP to rule out infection 2.  Keppra 1000mg  IV now 3.  EEG 4.  Acyclovir to be dosed by pharmacy 5.  Based on initial results of LP would determine need for broad spectrum antibiotics as well.  6.  Frequent neuro checks   Case discussed with Dr. Ardeth Sportsman, MD [facility] [phone]

## 2014-10-31 ENCOUNTER — Encounter (HOSPITAL_COMMUNITY): Payer: Self-pay | Admitting: Internal Medicine

## 2014-10-31 ENCOUNTER — Inpatient Hospital Stay (HOSPITAL_COMMUNITY): Payer: Medicare Other

## 2014-10-31 DIAGNOSIS — M1A9XX0 Chronic gout, unspecified, without tophus (tophi): Secondary | ICD-10-CM

## 2014-10-31 DIAGNOSIS — Z833 Family history of diabetes mellitus: Secondary | ICD-10-CM | POA: Diagnosis not present

## 2014-10-31 DIAGNOSIS — M6281 Muscle weakness (generalized): Secondary | ICD-10-CM

## 2014-10-31 DIAGNOSIS — E1121 Type 2 diabetes mellitus with diabetic nephropathy: Secondary | ICD-10-CM

## 2014-10-31 DIAGNOSIS — I160 Hypertensive urgency: Secondary | ICD-10-CM | POA: Diagnosis not present

## 2014-10-31 DIAGNOSIS — I469 Cardiac arrest, cause unspecified: Secondary | ICD-10-CM | POA: Diagnosis not present

## 2014-10-31 DIAGNOSIS — R509 Fever, unspecified: Secondary | ICD-10-CM | POA: Diagnosis not present

## 2014-10-31 DIAGNOSIS — N179 Acute kidney failure, unspecified: Secondary | ICD-10-CM | POA: Diagnosis present

## 2014-10-31 DIAGNOSIS — G9340 Encephalopathy, unspecified: Secondary | ICD-10-CM | POA: Diagnosis not present

## 2014-10-31 DIAGNOSIS — I25810 Atherosclerosis of coronary artery bypass graft(s) without angina pectoris: Secondary | ICD-10-CM | POA: Diagnosis present

## 2014-10-31 DIAGNOSIS — W19XXXA Unspecified fall, initial encounter: Secondary | ICD-10-CM | POA: Diagnosis present

## 2014-10-31 DIAGNOSIS — N183 Chronic kidney disease, stage 3 (moderate): Secondary | ICD-10-CM | POA: Diagnosis not present

## 2014-10-31 DIAGNOSIS — R7989 Other specified abnormal findings of blood chemistry: Secondary | ICD-10-CM | POA: Diagnosis not present

## 2014-10-31 DIAGNOSIS — G9349 Other encephalopathy: Secondary | ICD-10-CM | POA: Diagnosis not present

## 2014-10-31 DIAGNOSIS — R4701 Aphasia: Secondary | ICD-10-CM | POA: Diagnosis present

## 2014-10-31 DIAGNOSIS — Z7982 Long term (current) use of aspirin: Secondary | ICD-10-CM | POA: Diagnosis not present

## 2014-10-31 DIAGNOSIS — I159 Secondary hypertension, unspecified: Secondary | ICD-10-CM | POA: Insufficient documentation

## 2014-10-31 DIAGNOSIS — E1129 Type 2 diabetes mellitus with other diabetic kidney complication: Secondary | ICD-10-CM | POA: Diagnosis present

## 2014-10-31 DIAGNOSIS — Z79899 Other long term (current) drug therapy: Secondary | ICD-10-CM | POA: Diagnosis not present

## 2014-10-31 DIAGNOSIS — M109 Gout, unspecified: Secondary | ICD-10-CM | POA: Diagnosis present

## 2014-10-31 DIAGNOSIS — G8190 Hemiplegia, unspecified affecting unspecified side: Secondary | ICD-10-CM | POA: Diagnosis not present

## 2014-10-31 DIAGNOSIS — I4891 Unspecified atrial fibrillation: Secondary | ICD-10-CM | POA: Diagnosis not present

## 2014-10-31 DIAGNOSIS — Z794 Long term (current) use of insulin: Secondary | ICD-10-CM | POA: Diagnosis not present

## 2014-10-31 DIAGNOSIS — I10 Essential (primary) hypertension: Secondary | ICD-10-CM | POA: Diagnosis not present

## 2014-10-31 DIAGNOSIS — I480 Paroxysmal atrial fibrillation: Secondary | ICD-10-CM | POA: Diagnosis not present

## 2014-10-31 DIAGNOSIS — M6282 Rhabdomyolysis: Secondary | ICD-10-CM | POA: Diagnosis present

## 2014-10-31 DIAGNOSIS — E1122 Type 2 diabetes mellitus with diabetic chronic kidney disease: Secondary | ICD-10-CM | POA: Diagnosis present

## 2014-10-31 DIAGNOSIS — I2510 Atherosclerotic heart disease of native coronary artery without angina pectoris: Secondary | ICD-10-CM | POA: Diagnosis not present

## 2014-10-31 DIAGNOSIS — Z8546 Personal history of malignant neoplasm of prostate: Secondary | ICD-10-CM | POA: Diagnosis not present

## 2014-10-31 DIAGNOSIS — M153 Secondary multiple arthritis: Secondary | ICD-10-CM | POA: Diagnosis not present

## 2014-10-31 DIAGNOSIS — I129 Hypertensive chronic kidney disease with stage 1 through stage 4 chronic kidney disease, or unspecified chronic kidney disease: Secondary | ICD-10-CM | POA: Diagnosis present

## 2014-10-31 DIAGNOSIS — I252 Old myocardial infarction: Secondary | ICD-10-CM | POA: Diagnosis not present

## 2014-10-31 DIAGNOSIS — D72829 Elevated white blood cell count, unspecified: Secondary | ICD-10-CM | POA: Diagnosis not present

## 2014-10-31 DIAGNOSIS — E784 Other hyperlipidemia: Secondary | ICD-10-CM | POA: Diagnosis not present

## 2014-10-31 DIAGNOSIS — I255 Ischemic cardiomyopathy: Secondary | ICD-10-CM | POA: Diagnosis present

## 2014-10-31 DIAGNOSIS — R4182 Altered mental status, unspecified: Secondary | ICD-10-CM | POA: Diagnosis not present

## 2014-10-31 DIAGNOSIS — R2681 Unsteadiness on feet: Secondary | ICD-10-CM | POA: Diagnosis not present

## 2014-10-31 DIAGNOSIS — F17220 Nicotine dependence, chewing tobacco, uncomplicated: Secondary | ICD-10-CM | POA: Diagnosis present

## 2014-10-31 DIAGNOSIS — Z8249 Family history of ischemic heart disease and other diseases of the circulatory system: Secondary | ICD-10-CM | POA: Diagnosis not present

## 2014-10-31 LAB — I-STAT ARTERIAL BLOOD GAS, ED
BICARBONATE: 24.6 meq/L — AB (ref 20.0–24.0)
O2 Saturation: 95 %
PH ART: 7.41 (ref 7.350–7.450)
TCO2: 26 mmol/L (ref 0–100)
pCO2 arterial: 38.8 mmHg (ref 35.0–45.0)
pO2, Arterial: 76 mmHg — ABNORMAL LOW (ref 80.0–100.0)

## 2014-10-31 LAB — TROPONIN I
TROPONIN I: 0.48 ng/mL — AB (ref <0.031)
Troponin I: 0.32 ng/mL — ABNORMAL HIGH (ref <0.031)
Troponin I: 0.32 ng/mL — ABNORMAL HIGH (ref <0.031)
Troponin I: 0.39 ng/mL — ABNORMAL HIGH (ref <0.031)

## 2014-10-31 LAB — CBG MONITORING, ED
GLUCOSE-CAPILLARY: 211 mg/dL — AB (ref 65–99)
GLUCOSE-CAPILLARY: 262 mg/dL — AB (ref 65–99)
GLUCOSE-CAPILLARY: 336 mg/dL — AB (ref 65–99)
GLUCOSE-CAPILLARY: 348 mg/dL — AB (ref 65–99)
GLUCOSE-CAPILLARY: 366 mg/dL — AB (ref 65–99)
Glucose-Capillary: 331 mg/dL — ABNORMAL HIGH (ref 65–99)

## 2014-10-31 LAB — CSF CELL COUNT WITH DIFFERENTIAL
Eosinophils, CSF: 0 % (ref 0–1)
Eosinophils, CSF: 0 % (ref 0–1)
RBC Count, CSF: 1 /mm3 — ABNORMAL HIGH
RBC Count, CSF: 35 /mm3 — ABNORMAL HIGH
TUBE #: 4
Tube #: 1
WBC CSF: 1 /mm3 (ref 0–5)
WBC CSF: 1 /mm3 (ref 0–5)

## 2014-10-31 LAB — PROCALCITONIN

## 2014-10-31 LAB — COMPREHENSIVE METABOLIC PANEL
ALK PHOS: 62 U/L (ref 38–126)
ALT: 31 U/L (ref 17–63)
ANION GAP: 18 — AB (ref 5–15)
AST: 58 U/L — ABNORMAL HIGH (ref 15–41)
Albumin: 4 g/dL (ref 3.5–5.0)
BUN: 13 mg/dL (ref 6–20)
CALCIUM: 9 mg/dL (ref 8.9–10.3)
CO2: 22 mmol/L (ref 22–32)
CREATININE: 1.45 mg/dL — AB (ref 0.61–1.24)
Chloride: 95 mmol/L — ABNORMAL LOW (ref 101–111)
GFR, EST AFRICAN AMERICAN: 50 mL/min — AB (ref >60)
GFR, EST NON AFRICAN AMERICAN: 43 mL/min — AB (ref >60)
Glucose, Bld: 364 mg/dL — ABNORMAL HIGH (ref 65–99)
Potassium: 3.2 mmol/L — ABNORMAL LOW (ref 3.5–5.1)
SODIUM: 135 mmol/L (ref 135–145)
Total Bilirubin: 1.8 mg/dL — ABNORMAL HIGH (ref 0.3–1.2)
Total Protein: 7 g/dL (ref 6.5–8.1)

## 2014-10-31 LAB — CBC WITH DIFFERENTIAL/PLATELET
BASOS ABS: 0 10*3/uL (ref 0.0–0.1)
Basophils Relative: 0 %
EOS PCT: 0 %
Eosinophils Absolute: 0 10*3/uL (ref 0.0–0.7)
HEMATOCRIT: 46.6 % (ref 39.0–52.0)
Hemoglobin: 15.8 g/dL (ref 13.0–17.0)
LYMPHS ABS: 1 10*3/uL (ref 0.7–4.0)
LYMPHS PCT: 9 %
MCH: 31.2 pg (ref 26.0–34.0)
MCHC: 33.9 g/dL (ref 30.0–36.0)
MCV: 91.9 fL (ref 78.0–100.0)
MONO ABS: 0.3 10*3/uL (ref 0.1–1.0)
MONOS PCT: 3 %
NEUTROS ABS: 10.1 10*3/uL — AB (ref 1.7–7.7)
Neutrophils Relative %: 88 %
PLATELETS: 207 10*3/uL (ref 150–400)
RBC: 5.07 MIL/uL (ref 4.22–5.81)
RDW: 13.4 % (ref 11.5–15.5)
WBC: 11.4 10*3/uL — ABNORMAL HIGH (ref 4.0–10.5)

## 2014-10-31 LAB — VITAMIN B12: VITAMIN B 12: 418 pg/mL (ref 180–914)

## 2014-10-31 LAB — AMMONIA: Ammonia: 20 umol/L (ref 9–35)

## 2014-10-31 LAB — PROTIME-INR
INR: 1.09 (ref 0.00–1.49)
PROTHROMBIN TIME: 14.3 s (ref 11.6–15.2)

## 2014-10-31 LAB — INFLUENZA PANEL BY PCR (TYPE A & B)
H1N1 flu by pcr: NOT DETECTED
INFLAPCR: NEGATIVE
Influenza B By PCR: NEGATIVE

## 2014-10-31 LAB — CK
Total CK: 1806 U/L — ABNORMAL HIGH (ref 49–397)
Total CK: 1647 U/L — ABNORMAL HIGH (ref 49–397)

## 2014-10-31 LAB — PROTEIN AND GLUCOSE, CSF
GLUCOSE CSF: 139 mg/dL — AB (ref 40–70)
Total  Protein, CSF: 76 mg/dL — ABNORMAL HIGH (ref 15–45)

## 2014-10-31 LAB — TSH: TSH: 1.004 u[IU]/mL (ref 0.350–4.500)

## 2014-10-31 LAB — I-STAT CG4 LACTIC ACID, ED: LACTIC ACID, VENOUS: 1.79 mmol/L (ref 0.5–2.0)

## 2014-10-31 LAB — APTT: aPTT: 28 seconds (ref 24–37)

## 2014-10-31 LAB — GLUCOSE, CAPILLARY
GLUCOSE-CAPILLARY: 272 mg/dL — AB (ref 65–99)
GLUCOSE-CAPILLARY: 154 mg/dL — AB (ref 65–99)

## 2014-10-31 LAB — LACTIC ACID, PLASMA: LACTIC ACID, VENOUS: 1.9 mmol/L (ref 0.5–2.0)

## 2014-10-31 MED ORDER — SODIUM CHLORIDE 0.9 % IV SOLN
2.0000 g | INTRAVENOUS | Status: DC
  Administered 2014-10-31 – 2014-11-01 (×8): 2 g via INTRAVENOUS
  Filled 2014-10-31 (×12): qty 2000

## 2014-10-31 MED ORDER — ACETAMINOPHEN 650 MG RE SUPP
650.0000 mg | Freq: Once | RECTAL | Status: AC
  Administered 2014-10-31: 650 mg via RECTAL
  Filled 2014-10-31: qty 1

## 2014-10-31 MED ORDER — LABETALOL HCL 5 MG/ML IV SOLN
20.0000 mg | INTRAVENOUS | Status: DC | PRN
  Filled 2014-10-31: qty 4

## 2014-10-31 MED ORDER — CLONIDINE HCL 0.3 MG/24HR TD PTWK
0.3000 mg | MEDICATED_PATCH | TRANSDERMAL | Status: DC
  Administered 2014-10-31: 0.3 mg via TRANSDERMAL
  Filled 2014-10-31: qty 1

## 2014-10-31 MED ORDER — LABETALOL HCL 5 MG/ML IV SOLN: 10.0000 mg | Freq: Once | INTRAVENOUS | Status: DC

## 2014-10-31 MED ORDER — DEXTROSE 5 % IV SOLN
2.0000 g | Freq: Two times a day (BID) | INTRAVENOUS | Status: DC
  Administered 2014-10-31 (×3): 2 g via INTRAVENOUS
  Filled 2014-10-31 (×4): qty 2

## 2014-10-31 MED ORDER — INSULIN ASPART 100 UNIT/ML ~~LOC~~ SOLN
0.0000 [IU] | SUBCUTANEOUS | Status: DC
Start: 2014-10-31 — End: 2014-11-01
  Administered 2014-10-31: 7 [IU] via SUBCUTANEOUS
  Administered 2014-10-31: 2 [IU] via SUBCUTANEOUS
  Administered 2014-10-31: 9 [IU] via SUBCUTANEOUS
  Administered 2014-10-31 – 2014-11-01 (×4): 2 [IU] via SUBCUTANEOUS
  Filled 2014-10-31: qty 1

## 2014-10-31 MED ORDER — ACETAMINOPHEN 650 MG RE SUPP
650.0000 mg | RECTAL | Status: DC | PRN
  Administered 2014-10-31 (×2): 650 mg via RECTAL
  Filled 2014-10-31 (×2): qty 1

## 2014-10-31 MED ORDER — CHLORHEXIDINE GLUCONATE 0.12 % MT SOLN
15.0000 mL | Freq: Two times a day (BID) | OROMUCOSAL | Status: DC
  Administered 2014-10-31 – 2014-11-01 (×2): 15 mL via OROMUCOSAL

## 2014-10-31 MED ORDER — DEXTROSE 5 % IV SOLN
800.0000 mg | Freq: Three times a day (TID) | INTRAVENOUS | Status: DC
  Administered 2014-10-31 – 2014-11-01 (×3): 800 mg via INTRAVENOUS
  Filled 2014-10-31 (×5): qty 16

## 2014-10-31 MED ORDER — HYDRALAZINE HCL 20 MG/ML IJ SOLN
10.0000 mg | INTRAMUSCULAR | Status: DC | PRN
  Administered 2014-10-31 – 2014-11-02 (×6): 10 mg via INTRAVENOUS
  Filled 2014-10-31 (×6): qty 1

## 2014-10-31 MED ORDER — ACETAMINOPHEN 325 MG RE SUPP
325.0000 mg | Freq: Once | RECTAL | Status: AC
  Administered 2014-10-31: 325 mg via RECTAL
  Filled 2014-10-31: qty 1

## 2014-10-31 MED ORDER — LABETALOL HCL 5 MG/ML IV SOLN
20.0000 mg | Freq: Once | INTRAVENOUS | Status: AC
  Administered 2014-10-31: 20 mg via INTRAVENOUS
  Filled 2014-10-31: qty 4

## 2014-10-31 MED ORDER — VANCOMYCIN HCL IN DEXTROSE 750-5 MG/150ML-% IV SOLN
750.0000 mg | Freq: Two times a day (BID) | INTRAVENOUS | Status: DC
  Administered 2014-10-31 (×2): 750 mg via INTRAVENOUS
  Filled 2014-10-31 (×3): qty 150

## 2014-10-31 MED ORDER — POTASSIUM CHLORIDE 10 MEQ/100ML IV SOLN
10.0000 meq | INTRAVENOUS | Status: AC
  Administered 2014-10-31 (×3): 10 meq via INTRAVENOUS
  Filled 2014-10-31 (×3): qty 100

## 2014-10-31 MED ORDER — SODIUM CHLORIDE 0.9 % IV SOLN
INTRAVENOUS | Status: AC
  Administered 2014-10-31 – 2014-11-01 (×2): via INTRAVENOUS

## 2014-10-31 MED ORDER — METOPROLOL TARTRATE 1 MG/ML IV SOLN
5.0000 mg | Freq: Four times a day (QID) | INTRAVENOUS | Status: DC
  Administered 2014-10-31 – 2014-11-01 (×3): 5 mg via INTRAVENOUS
  Filled 2014-10-31 (×3): qty 5

## 2014-10-31 MED ORDER — VANCOMYCIN HCL IN DEXTROSE 1-5 GM/200ML-% IV SOLN
1000.0000 mg | INTRAVENOUS | Status: AC
  Administered 2014-10-31: 1000 mg via INTRAVENOUS
  Filled 2014-10-31: qty 200

## 2014-10-31 MED ORDER — HYDRALAZINE HCL 20 MG/ML IJ SOLN
10.0000 mg | Freq: Once | INTRAMUSCULAR | Status: AC
Start: 2014-10-31 — End: 2014-10-31
  Administered 2014-10-31: 10 mg via INTRAVENOUS
  Filled 2014-10-31: qty 1

## 2014-10-31 MED ORDER — CETYLPYRIDINIUM CHLORIDE 0.05 % MT LIQD
7.0000 mL | Freq: Two times a day (BID) | OROMUCOSAL | Status: DC
  Administered 2014-10-31 – 2014-11-05 (×10): 7 mL via OROMUCOSAL

## 2014-10-31 MED ORDER — INSULIN DETEMIR 100 UNIT/ML ~~LOC~~ SOLN
25.0000 [IU] | Freq: Every day | SUBCUTANEOUS | Status: DC
  Administered 2014-10-31 – 2014-11-01 (×2): 25 [IU] via SUBCUTANEOUS
  Filled 2014-10-31 (×2): qty 0.25

## 2014-10-31 MED ORDER — METOPROLOL TARTRATE 1 MG/ML IV SOLN
2.5000 mg | Freq: Four times a day (QID) | INTRAVENOUS | Status: DC
  Administered 2014-10-31 (×2): 2.5 mg via INTRAVENOUS
  Filled 2014-10-31 (×2): qty 5

## 2014-10-31 NOTE — Consult Note (Addendum)
[facility]           Total days of antibiotics 2        Day 10/12-10/13 ampicillin, ceftriaxone, vancomycin, ricfampin, acyclovir       Reason for Consult: Fever and AMS    Referring Physician: Ripudeep Rai  Principal Problem:   Acute encephalopathy Active Problems:   Chronic kidney disease, stage 3   Gout, chronic   Encephalopathy acute   Hypertensive urgency   DM (diabetes mellitus), type 2 with renal complications (HCC)   Secondary hypertension, unspecified   HPI: John Romero is a 81 y.o. male with history of T2DM, prostate CA s/p resection, ischemic cardiomyopathy/MI/CABG presented to ED after he was found down in his apartment last night. He was last seen as being at baseline lunchtime yesterday by a neighbor around 12-1pm without known symptomatic complaints, but was found lying after a fall next to his refrigerator around 6pm. Of note he was reported to have left lateral gaze deviation and no movement of right extremities. He presented to ED with SBP in 220s and HR 110s, and fever up to 101.6. CT and MRI of head/neck demonstrated numerous old areas of infarct but no acute process. LP obtained showing glucose 139, protein 76, 1 WBC, few neuts/lymphs/monos. EEG obtained consistent with asymmetric activity but no epileptiform pattern. Patient was started empirically on ampicillin, ceftriaxone, vancomycin, acyclovir for possible bacterial or viral meningitis.   Past Medical History  Diagnosis Date  . Type II or unspecified type diabetes mellitus without mention of complication, uncontrolled   . Other and unspecified hyperlipidemia   . Gout, unspecified   . Unspecified essential hypertension   . Type II or unspecified type diabetes mellitus with renal manifestations, uncontrolled   . 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 Gout, unspecified   . Type II or unspecified type diabetes mellitus without mention of complication,  uncontrolled   . First degree atrioventricular block   . Malignant neoplasm of prostate ([place])   . Malignant neoplasm of prostate ([place])   . Type II or unspecified type diabetes mellitus without mention of complication, not stated as uncontrolled   . Other and unspecified hyperlipidemia   . Unspecified tinnitus   . Unspecified essential hypertension   . Old myocardial infarction   . Coronary atherosclerosis of unspecified type of vessel, native or graft   . Edema   . Undiagnosed cardiac murmurs   . Undiagnosed cardiac murmurs     Allergies: No Known Allergies  Current antibiotics:   MEDICATIONS: . acyclovir  800 mg Intravenous 3 times per day  . ampicillin (OMNIPEN) IV  2 g Intravenous 6 times per day  . cefTRIAXone (ROCEPHIN)  IV  2 g Intravenous Q12H  . cloNIDine  0.3 mg Transdermal Weekly  . insulin aspart  0-9 Units Subcutaneous Q4H  . insulin detemir  25 Units Subcutaneous Daily  . metoprolol  5 mg Intravenous 4 times per day  . vancomycin  750 mg Intravenous Q12H    Social History  Substance Use Topics  . Smoking status: Never Smoker   . Smokeless tobacco: Current User    Types: Chew  . Alcohol Use: No    Family History  Problem Relation Age of Onset  . Diabetes Mother   . Heart disease Father   . Cancer Sister     lung  . Cancer Sister     lung    Review of Systems  Unable to perform ROS: patient unresponsive    OBJECTIVE: Temp:  [100.9 F (38.3 C)-101.6 F (38.7 C)] 100.9 F (38.3 C) (10/13 1707) Pulse Rate:  [96-127] 96 (10/13 1707) Resp:  [17-32] 22 (10/13 1707) BP: (161-221)/(77-126) 188/93 mmHg (10/13 1707) SpO2:  [90 %-96 %] 93 % (10/13 1707) FiO2 (%):  [2 %] 2 % (10/13 1707) Weight:  [94.1 kg (207 lb 7.3 oz)] 94.1 kg (207 lb 7.3 oz) (10/13 0100)   GENERAL- minimally responsive, opens eyes to loud voice and painful stimuli, minimally withdraws on left side HEENT- Atraumatic, PERRL, mouth with accumulated brown oral tobacco product, neck is  stiff and resists flexion CARDIAC- RRR, mid systolic murmur audible over LUSB RESP- CTAB, no wheezes or crackles. ABDOMEN- Soft, nontender, no guarding or rebound, normoactive bowel sounds present NEURO- Limited by patient not following commands, nonverbal Flinches from approach to left visual field, no reaction to right, withdraws left extremities and some spontaneous questionably purposeful movement of left arm and leg EXTREMITIES- pulse 2+, symmetric, 2+ pedal edema below knee bilaterally, left great toe black under nail bed with mild erythema SKIN- Warm, dry, No rash or lesion.   LABS: Results for orders placed or performed during the hospital encounter of 10/30/14 (from the past 48 hour(s))  Urinalysis with microscopic     Status: Abnormal   Collection Time: 10/30/14  7:35 PM  Result Value Ref Range   Color, Urine YELLOW YELLOW   APPearance CLEAR CLEAR   Specific Gravity, Urine 1.014 1.005 - 1.030   pH 7.0 5.0 - 8.0   Glucose, UA >1000 (A) NEGATIVE mg/dL   Hgb urine dipstick LARGE (A) NEGATIVE   Bilirubin Urine NEGATIVE NEGATIVE   Ketones, ur 40 (A) NEGATIVE mg/dL   Protein, ur 100 (A) NEGATIVE mg/dL   Urobilinogen, UA 1.0 0.0 - 1.0 mg/dL   Nitrite NEGATIVE NEGATIVE   Leukocytes, UA NEGATIVE NEGATIVE   WBC, UA 0-2 <3 WBC/hpf   RBC / HPF 0-2 <3 RBC/hpf   Squamous Epithelial / LPF RARE RARE   Casts HYALINE CASTS (A) NEGATIVE  CBC with Differential     Status: Abnormal   Collection Time: 10/30/14  7:50 PM  Result Value Ref Range   WBC 12.6 (H) 4.0 - 10.5 K/uL   RBC 5.08 4.22 - 5.81 MIL/uL   Hemoglobin 15.7 13.0 - 17.0 g/dL   HCT 46.2 39.0 - 52.0 %   MCV 90.9 78.0 - 100.0 fL   MCH 30.9 26.0 - 34.0 pg   MCHC 34.0 30.0 - 36.0 g/dL   RDW 13.0 11.5 - 15.5 %   Platelets 208 150 - 400 K/uL   Neutrophils Relative % 91 %   Neutro Abs 11.5 (H) 1.7 - 7.7 K/uL   Lymphocytes Relative 6 %   Lymphs Abs 0.8 0.7 - 4.0 K/uL   Monocytes Relative 3 %   Monocytes Absolute 0.3 0.1 - 1.0  K/uL   Eosinophils Relative 0 %   Eosinophils Absolute 0.0 0.0 - 0.7 K/uL   Basophils Relative 0 %   Basophils Absolute 0.0 0.0 - 0.1 K/uL  Comprehensive metabolic panel     Status: Abnormal   Collection Time: 10/30/14  7:50 PM  Result Value Ref Range   Sodium 138 135 - 145 mmol/L   Potassium 3.2 (L) 3.5 - 5.1 mmol/L   Chloride 98 (L) 101 - 111 mmol/L   CO2 24 22 - 32 mmol/L   Glucose, Bld 254 (H) 65 - 99 mg/dL   BUN  15 6 - 20 mg/dL   Creatinine, Ser 1.31 (H) 0.61 - 1.24 mg/dL   Calcium 9.3 8.9 - 10.3 mg/dL   Total Protein 7.5 6.5 - 8.1 g/dL   Albumin 4.4 3.5 - 5.0 g/dL   AST 42 (H) 15 - 41 U/L   ALT 28 17 - 63 U/L   Alkaline Phosphatase 68 38 - 126 U/L   Total Bilirubin 1.3 (H) 0.3 - 1.2 mg/dL   GFR calc non Af Amer 49 (L) >60 mL/min   GFR calc Af Amer 57 (L) >60 mL/min    Comment: (NOTE) The eGFR has been calculated using the CKD EPI equation. This calculation has not been validated in all clinical situations. eGFR's persistently <60 mL/min signify possible Chronic Kidney Disease.    Anion gap 16 (H) 5 - 15  Protime-INR     Status: None   Collection Time: 10/30/14  7:50 PM  Result Value Ref Range   Prothrombin Time 13.8 11.6 - 15.2 seconds   INR 1.04 0.00 - 1.49  APTT     Status: None   Collection Time: 10/30/14  7:50 PM  Result Value Ref Range   aPTT 26 24 - 37 seconds  CK     Status: Abnormal   Collection Time: 10/30/14  7:50 PM  Result Value Ref Range   Total CK 531 (H) 49 - 397 U/L  I-Stat CG4 Lactic Acid, ED     Status: Abnormal   Collection Time: 10/30/14  8:05 PM  Result Value Ref Range   Lactic Acid, Venous 2.15 (HH) 0.5 - 2.0 mmol/L   Comment NOTIFIED PHYSICIAN   POC CBG, ED     Status: Abnormal   Collection Time: 10/30/14  8:22 PM  Result Value Ref Range   Glucose-Capillary 211 (H) 65 - 99 mg/dL  I-Stat Troponin, ED (not at [facility])     Status: None   Collection Time: 10/30/14  8:24 PM  Result Value Ref Range   Troponin i, poc 0.06 0.00 - 0.08 ng/mL    Comment 3            Comment: Due to the release kinetics of cTnI, a negative result within the first hours of the onset of symptoms does not rule out myocardial infarction with certainty. If myocardial infarction is still suspected, repeat the test at appropriate intervals.   Fungus Culture with Smear     Status: None (Preliminary result)   Collection Time: 10/30/14 11:50 PM  Result Value Ref Range   Specimen Description CSF    Special Requests NONE    Fungal Smear      NO YEAST OR FUNGAL ELEMENTS SEEN Performed at [facility] Performed at [facility]    Report Status PENDING   CSF cell count with differential collection tube #: 1     Status: Abnormal   Collection Time: 10/30/14 11:55 PM  Result Value Ref Range   Tube # 1    Color, CSF COLORLESS COLORLESS   Appearance, CSF CLEAR CLEAR   Supernatant NOT INDICATED    RBC Count, CSF 35 (H) 0 /cu mm   WBC, CSF 1 0 - 5 /cu mm   Segmented Neutrophils-CSF TOO FEW TO COUNT, SMEAR AVAILABLE FOR REVIEW 0 - 6 %    Comment: RARE   Lymphs, CSF OCCASIONAL 40 - 80 %   Monocyte-Macrophage-Spinal Fluid FEW 15 - 45 %  Eosinophils, CSF 0 0 - 1 %  CSF cell count with differential     Status: Abnormal   Collection Time: 10/30/14 11:55 PM  Result Value Ref Range   Tube # 4    Color, CSF COLORLESS COLORLESS   Appearance, CSF CLEAR CLEAR   Supernatant NOT INDICATED    RBC Count, CSF 1 (H) 0 /cu mm   WBC, CSF 1 0 - 5 /cu mm   Segmented Neutrophils-CSF TOO FEW TO COUNT, SMEAR AVAILABLE FOR REVIEW 0 - 6 %    Comment: RARE   Lymphs, CSF FEW 40 - 80 %   Monocyte-Macrophage-Spinal Fluid FEW 15 - 45 %   Eosinophils, CSF 0 0 - 1 %  CSF culture     Status: None (Preliminary result)   Collection Time: 10/30/14 11:55 PM  Result Value Ref Range   Specimen Description CSF    Special Requests NONE    Gram Stain      CYTOSPIN SMEAR WBC PRESENT,BOTH PMN AND MONONUCLEAR NO  ORGANISMS SEEN    Culture PENDING    Report Status PENDING   Protein and glucose, CSF     Status: Abnormal   Collection Time: 10/30/14 11:55 PM  Result Value Ref Range   Glucose, CSF 139 (H) 40 - 70 mg/dL   Total  Protein, CSF 76 (H) 15 - 45 mg/dL  I-Stat CG4 Lactic Acid, ED     Status: None   Collection Time: 10/31/14 12:14 AM  Result Value Ref Range   Lactic Acid, Venous 1.79 0.5 - 2.0 mmol/L  CBG monitoring, ED     Status: Abnormal   Collection Time: 10/31/14 12:44 AM  Result Value Ref Range   Glucose-Capillary 262 (H) 65 - 99 mg/dL  I-Stat arterial blood gas, ED     Status: Abnormal   Collection Time: 10/31/14 12:47 AM  Result Value Ref Range   pH, Arterial 7.410 7.350 - 7.450   pCO2 arterial 38.8 35.0 - 45.0 mmHg   pO2, Arterial 76.0 (L) 80.0 - 100.0 mmHg   Bicarbonate 24.6 (H) 20.0 - 24.0 mEq/L   TCO2 26 0 - 100 mmol/L   O2 Saturation 95.0 %   Patient temperature 98.6 F    Collection site RADIAL, ALLEN'S TEST ACCEPTABLE    Drawn by Operator    Sample type ARTERIAL   CK     Status: Abnormal   Collection Time: 10/31/14  3:44 AM  Result Value Ref Range   Total CK 1806 (H) 49 - 397 U/L  Ammonia     Status: None   Collection Time: 10/31/14  3:44 AM  Result Value Ref Range   Ammonia 20 9 - 35 umol/L  Troponin I     Status: Abnormal   Collection Time: 10/31/14  3:44 AM  Result Value Ref Range   Troponin I 0.32 (H) <0.031 ng/mL    Comment:        PERSISTENTLY INCREASED TROPONIN VALUES IN THE RANGE OF 0.04-0.49 ng/mL CAN BE SEEN IN:       -UNSTABLE ANGINA       -CONGESTIVE HEART FAILURE       -MYOCARDITIS       -CHEST TRAUMA       -ARRYHTHMIAS       -LATE PRESENTING MYOCARDIAL INFARCTION       -COPD   CLINICAL FOLLOW-UP RECOMMENDED.   CBC with Differential     Status: Abnormal   Collection Time: 10/31/14  4:29 AM  Result Value  Ref Range   WBC 11.4 (H) 4.0 - 10.5 K/uL   RBC 5.07 4.22 - 5.81 MIL/uL   Hemoglobin 15.8 13.0 - 17.0 g/dL   HCT 46.6 39.0 - 52.0 %    MCV 91.9 78.0 - 100.0 fL   MCH 31.2 26.0 - 34.0 pg   MCHC 33.9 30.0 - 36.0 g/dL   RDW 13.4 11.5 - 15.5 %   Platelets 207 150 - 400 K/uL   Neutrophils Relative % 88 %   Neutro Abs 10.1 (H) 1.7 - 7.7 K/uL   Lymphocytes Relative 9 %   Lymphs Abs 1.0 0.7 - 4.0 K/uL   Monocytes Relative 3 %   Monocytes Absolute 0.3 0.1 - 1.0 K/uL   Eosinophils Relative 0 %   Eosinophils Absolute 0.0 0.0 - 0.7 K/uL   Basophils Relative 0 %   Basophils Absolute 0.0 0.0 - 0.1 K/uL  Comprehensive metabolic panel     Status: Abnormal   Collection Time: 10/31/14  4:29 AM  Result Value Ref Range   Sodium 135 135 - 145 mmol/L   Potassium 3.2 (L) 3.5 - 5.1 mmol/L   Chloride 95 (L) 101 - 111 mmol/L   CO2 22 22 - 32 mmol/L   Glucose, Bld 364 (H) 65 - 99 mg/dL   BUN 13 6 - 20 mg/dL   Creatinine, Ser 1.45 (H) 0.61 - 1.24 mg/dL   Calcium 9.0 8.9 - 10.3 mg/dL   Total Protein 7.0 6.5 - 8.1 g/dL   Albumin 4.0 3.5 - 5.0 g/dL   AST 58 (H) 15 - 41 U/L   ALT 31 17 - 63 U/L   Alkaline Phosphatase 62 38 - 126 U/L   Total Bilirubin 1.8 (H) 0.3 - 1.2 mg/dL   GFR calc non Af Amer 43 (L) >60 mL/min   GFR calc Af Amer 50 (L) >60 mL/min    Comment: (NOTE) The eGFR has been calculated using the CKD EPI equation. This calculation has not been validated in all clinical situations. eGFR's persistently <60 mL/min signify possible Chronic Kidney Disease.    Anion gap 18 (H) 5 - 15  Lactic acid, plasma     Status: None   Collection Time: 10/31/14  4:29 AM  Result Value Ref Range   Lactic Acid, Venous 1.9 0.5 - 2.0 mmol/L  Procalcitonin     Status: None   Collection Time: 10/31/14  4:29 AM  Result Value Ref Range   Procalcitonin <0.10 ng/mL    Comment:        Interpretation: PCT (Procalcitonin) <= 0.5 ng/mL: Systemic infection (sepsis) is not likely. Local bacterial infection is possible. (NOTE)         ICU PCT Algorithm               Non ICU PCT Algorithm    ----------------------------      ------------------------------         PCT < 0.25 ng/mL                 PCT < 0.1 ng/mL     Stopping of antibiotics            Stopping of antibiotics       strongly encouraged.               strongly encouraged.    ----------------------------     ------------------------------       PCT level decrease by  PCT < 0.25 ng/mL       >= 80% from peak PCT       OR PCT 0.25 - 0.5 ng/mL          Stopping of antibiotics                                             encouraged.     Stopping of antibiotics           encouraged.    ----------------------------     ------------------------------       PCT level decrease by              PCT >= 0.25 ng/mL       < 80% from peak PCT        AND PCT >= 0.5 ng/mL            Continuin g antibiotics                                              encouraged.       Continuing antibiotics            encouraged.    ----------------------------     ------------------------------     PCT level increase compared          PCT > 0.5 ng/mL         with peak PCT AND          PCT >= 0.5 ng/mL             Escalation of antibiotics                                          strongly encouraged.      Escalation of antibiotics        strongly encouraged.   Protime-INR     Status: None   Collection Time: 10/31/14  4:29 AM  Result Value Ref Range   Prothrombin Time 14.3 11.6 - 15.2 seconds   INR 1.09 0.00 - 1.49  APTT     Status: None   Collection Time: 10/31/14  4:29 AM  Result Value Ref Range   aPTT 28 24 - 37 seconds  CBG monitoring, ED     Status: Abnormal   Collection Time: 10/31/14  4:44 AM  Result Value Ref Range   Glucose-Capillary 336 (H) 65 - 99 mg/dL  Influenza panel by PCR (type A & B, H1N1)     Status: None   Collection Time: 10/31/14  4:50 AM  Result Value Ref Range   Influenza A By PCR NEGATIVE NEGATIVE   Influenza B By PCR NEGATIVE NEGATIVE   H1N1 flu by pcr NOT DETECTED NOT DETECTED    Comment:        The Xpert Flu assay (FDA approved  for nasal aspirates or washes and nasopharyngeal swab specimens), is intended as an aid in the diagnosis of influenza and should not be used as a sole basis for treatment.   Troponin I (q 6hr x 3)     Status: Abnormal   Collection Time: 10/31/14  7:53 AM  Result Value Ref Range  Troponin I 0.32 (H) <0.031 ng/mL    Comment:        PERSISTENTLY INCREASED TROPONIN VALUES IN THE RANGE OF 0.04-0.49 ng/mL CAN BE SEEN IN:       -UNSTABLE ANGINA       -CONGESTIVE HEART FAILURE       -MYOCARDITIS       -CHEST TRAUMA       -ARRYHTHMIAS       -LATE PRESENTING MYOCARDIAL INFARCTION       -COPD   CLINICAL FOLLOW-UP RECOMMENDED.   CBG monitoring, ED     Status: Abnormal   Collection Time: 10/31/14  8:32 AM  Result Value Ref Range   Glucose-Capillary 348 (H) 65 - 99 mg/dL  CBG monitoring, ED     Status: Abnormal   Collection Time: 10/31/14  9:43 AM  Result Value Ref Range   Glucose-Capillary 366 (H) 65 - 99 mg/dL  CBG monitoring, ED     Status: Abnormal   Collection Time: 10/31/14 10:57 AM  Result Value Ref Range   Glucose-Capillary 331 (H) 65 - 99 mg/dL  Troponin I (q 6hr x 3)     Status: Abnormal   Collection Time: 10/31/14 12:26 PM  Result Value Ref Range   Troponin I 0.39 (H) <0.031 ng/mL    Comment:        PERSISTENTLY INCREASED TROPONIN VALUES IN THE RANGE OF 0.04-0.49 ng/mL CAN BE SEEN IN:       -UNSTABLE ANGINA       -CONGESTIVE HEART FAILURE       -MYOCARDITIS       -CHEST TRAUMA       -ARRYHTHMIAS       -LATE PRESENTING MYOCARDIAL INFARCTION       -COPD   CLINICAL FOLLOW-UP RECOMMENDED.   Vitamin B12     Status: None   Collection Time: 10/31/14 12:26 PM  Result Value Ref Range   Vitamin B-12 418 180 - 914 pg/mL    Comment: (NOTE) This assay is not validated for testing neonatal or myeloproliferative syndrome specimens for Vitamin B12 levels.   TSH     Status: None   Collection Time: 10/31/14 12:26 PM  Result Value Ref Range   TSH 1.004 0.350 - 4.500 uIU/mL    CK     Status: Abnormal   Collection Time: 10/31/14 12:27 PM  Result Value Ref Range   Total CK 1647 (H) 49 - 397 U/L    MICRO:  IMAGING: Dg Chest 2 View  10/30/2014  CLINICAL DATA:  Altered mental status EXAM: CHEST  2 VIEW COMPARISON:  February 03, 2006 FINDINGS: There is no edema or consolidation. Heart is upper normal in size with pulmonary vascularity within normal limits. Patient is status post coronary artery bypass grafting. There is atherosclerotic change in the aorta. No adenopathy. No pneumothorax. No bone lesions. IMPRESSION: No edema or consolidation. Electronically Signed   By: Lowella Grip III M.D.   On: 10/30/2014 19:50   Ct Head Wo Contrast  10/30/2014  CLINICAL DATA:  81 year old male with altered mental status, found down. EXAM: CT HEAD WITHOUT CONTRAST CT CERVICAL SPINE WITHOUT CONTRAST TECHNIQUE: Multidetector CT imaging of the head and cervical spine was performed following the standard protocol without intravenous contrast. Multiplanar CT image reconstructions of the cervical spine were also generated. COMPARISON:  None. FINDINGS: CT HEAD FINDINGS Atrophy, chronic small-vessel white matter ischemic changes and small remote bilateral cerebellar infarcts noted. No acute intracranial abnormalities are identified, including mass lesion  or mass effect, hydrocephalus, extra-axial fluid collection, midline shift, hemorrhage, or acute infarction. The visualized bony calvarium is unremarkable. CT CERVICAL SPINE FINDINGS Normal alignment noted. There is no evidence of acute fracture, subluxation or prevertebral soft tissue swelling. Moderate to severe degenerative disc disease and spondylosis from C2-C6 identified contributing to moderate central spinal and bony foraminal narrowing. No focal bony lesions are identified. The soft tissue structures and lung apices are unremarkable. IMPRESSION: No evidence of acute intracranial abnormality. Atrophy, chronic small-vessel white matter  ischemic changes and small remote bilateral cerebellar infarcts. No static evidence of acute injury to the cervical spine. Moderate to severe degenerative changes from C2-C6 contributing to moderate central spinal and foraminal narrowing. Electronically Signed   By: Margarette Canada M.D.   On: 10/30/2014 19:52   Ct Cervical Spine Wo Contrast  10/30/2014  CLINICAL DATA:  81 year old male with altered mental status, found down. EXAM: CT HEAD WITHOUT CONTRAST CT CERVICAL SPINE WITHOUT CONTRAST TECHNIQUE: Multidetector CT imaging of the head and cervical spine was performed following the standard protocol without intravenous contrast. Multiplanar CT image reconstructions of the cervical spine were also generated. COMPARISON:  None. FINDINGS: CT HEAD FINDINGS Atrophy, chronic small-vessel white matter ischemic changes and small remote bilateral cerebellar infarcts noted. No acute intracranial abnormalities are identified, including mass lesion or mass effect, hydrocephalus, extra-axial fluid collection, midline shift, hemorrhage, or acute infarction. The visualized bony calvarium is unremarkable. CT CERVICAL SPINE FINDINGS Normal alignment noted. There is no evidence of acute fracture, subluxation or prevertebral soft tissue swelling. Moderate to severe degenerative disc disease and spondylosis from C2-C6 identified contributing to moderate central spinal and bony foraminal narrowing. No focal bony lesions are identified. The soft tissue structures and lung apices are unremarkable. IMPRESSION: No evidence of acute intracranial abnormality. Atrophy, chronic small-vessel white matter ischemic changes and small remote bilateral cerebellar infarcts. No static evidence of acute injury to the cervical spine. Moderate to severe degenerative changes from C2-C6 contributing to moderate central spinal and foraminal narrowing. Electronically Signed   By: Margarette Canada M.D.   On: 10/30/2014 19:52   Mr Brain Wo Contrast  10/30/2014   CLINICAL DATA:  Found down on kitchen floor, last seen normal at noon today. Aphasic, LEFT-sided gaze. History of hypertension, diabetes, prostate cancer. EXAM: MRI HEAD WITHOUT CONTRAST TECHNIQUE: Multiplanar, multiecho pulse sequences of the brain and surrounding structures were obtained without intravenous contrast. COMPARISON:  CT head October 30, 2014 at 1928 hours FINDINGS: No reduced diffusion to suggest acute ischemia. Punctate foci of susceptibility artifact LEFT cerebellum, RIGHT frontal lobe are nonspecific. O bilateral small cerebellar infarcts. Old RIGHT basal ganglia lacunar infarct. Ventricles are normal for age, mild sulcal effacement at the convexities. Patchy supratentorial white matter T2 hyperintensities without midline shift, mass effect or mass lesions. No abnormal extra-axial fluid collections. Normal major intracranial vascular flow voids seen at the skull base. Status post bilateral ocular lens implants. Small LEFT maxillary mucosal retention cyst without paranasal sinus air-fluid levels. Old LEFT medial orbital blowout fracture. Trace LEFT mastoid effusion. No abnormal sellar expansion. No cerebellar tonsillar ectopia. Patient is edentulous. IMPRESSION: No acute intracranial process. Involutional changes, superimposed component of suspected normal pressure hydrocephalus. Moderate white matter changes can be seen with chronic small vessel ischemic disease. 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 Electronically Signed   By: Elon Alas M.D.   On: 10/30/2014 22:32    HISTORICAL MICRO/IMAGING  Assessment/Plan:  81yo M with PMHX of CAD s/p CABG, DM ,  HTN found unresponsive with left gaze preference, decrease movement to right upper and lower extremities initially worked up for stroke but mri only showing old infarct, now concern for infectious causes due to fever, and leukocytosis. He underwent LP that was unremarkable with wbc 1, elevated protein,  and glucose. Gram stain negative. He was started on vanco, ceftriaxone, amp, acyclovir. UA and CXR not likely cause of presentation  Acute encephalitis CSF analysis not consistent with bacterial infection, nor viral, since we would likely see more pleocytosis. - for now, continue ampicillin, vancomycin and acyclovir.  - please add [place] virus, will follow up on hsv PCR, VZV IgG - if still altered and febrile over the next 48rh ,recommend repeat LP to evaluate CSF changes -Will follow up CSF Cx, blood Cx in next 48hr to decide to de-escalate - follow neuro recs  Decrease motor response in right upper nad lower extremity = thought to be from new stroke though imaging does not suggest thatCVA  Fevers = will follow fever curve, may repeat blood cx in the am if still afebrile.  Hypertensive urgency/emergency = primary team adjust prn meds to minimize risk of end organ damage. Continue to treat with prn labetolol   Hinton Lovely Internal Medicine Resident PGY-I Pager: [phone]  10/31/2014, 7:59 PM -------------------------------------------------------------------------------   Date: 10/31/2014 Patient name: John Romero  Medical record number: [id number]  Date of birth: [birth year]   I have seen and evaluated [name] and discussed their care with the Residency Team.   Assessment and Plan: I have seen and evaluated the patient as outlined above. I agree with the formulated Assessment and Plan as detailed in the resident's consultation note.  Elzie Rings [facility] [phone]

## 2014-10-31 NOTE — ED Notes (Signed)
Reviewed bp with dr. Oleta Mouse, no new orders at this time.

## 2014-10-31 NOTE — ED Notes (Signed)
cbg 336

## 2014-10-31 NOTE — Care Management Note (Signed)
Case Management Note  Patient Details  Name: John Romero MRN: [id number] Date of Birth: July 15, 1930  Subjective/Objective:    Adm w fever                Action/Plan: lives w wife, pcp dr Brayton Layman carter   Expected Discharge Date:                  Expected Discharge Plan:     In-House Referral:     Discharge planning Services     Post Acute Care Choice:    Choice offered to:     DME Arranged:    DME Agency:     HH Arranged:    [place] Agency:     Status of Service:     Medicare Important Message Given:    Date Medicare IM Given:    Medicare IM give by:    Date Additional Medicare IM Given:    Additional Medicare Important Message give by:     If discussed at [place] of Stay Meetings, dates discussed:    Additional Comments: ur review done  Lacretia Leigh, RN 10/31/2014, 2:26 PM

## 2014-10-31 NOTE — ED Notes (Signed)
Dr. Earley Abide, hospitalist, at the bedside.

## 2014-10-31 NOTE — ED Notes (Signed)
Called dr. Chyrl Civatte to report bp, next prn dose of hydralazine due at 0700, current bp is 187/100. Also clarified if patient should receive levemir dose now, sliding scale was held. MD orders to repeat hydralizine 10mg , to administer levemir.

## 2014-10-31 NOTE — ED Notes (Signed)
Admitting MD at bedside.

## 2014-10-31 NOTE — ED Notes (Signed)
Attempted to call report to floor, RN unavailable for report but will call back.

## 2014-10-31 NOTE — ED Notes (Signed)
Spoke with Rai MD who wishes for pt to receive insulin coverage at this time.

## 2014-10-31 NOTE — ED Notes (Signed)
Rai MD reports to hold insulin until she sees pt.

## 2014-10-31 NOTE — ED Notes (Signed)
Reported troponin 0.32 and ct 1806 to Dr. Chyrl Civatte, hospitalist. MD orders to increase fluids to 136mL/hr.

## 2014-10-31 NOTE — ED Notes (Signed)
Dr. Chyrl Civatte paged for lab results.

## 2014-10-31 NOTE — Progress Notes (Signed)
Subjective: No overnight events. Mental status continues to be depressed. EEG completed, results pending.   Objective: Current vital signs: BP 175/82 mmHg  Pulse 117  Temp(Src) 101.4 F (38.6 C) (Rectal)  Resp 26  Ht 5' 10.87" (1.8 m)  Wt 94.1 kg (207 lb 7.3 oz)  BMI 29.04 kg/m2  SpO2 93% Vital signs in last 24 hours: Temp:  [101.3 F (38.5 C)-101.6 F (38.7 C)] 101.4 F (38.6 C) (10/13 1024) Pulse Rate:  [103-127] 117 (10/13 0915) Resp:  [17-32] 26 (10/13 0915) BP: (161-221)/(79-126) 175/82 mmHg (10/13 0915) SpO2:  [90 %-96 %] 93 % (10/13 0915) Weight:  [94.1 kg (207 lb 7.3 oz)] 94.1 kg (207 lb 7.3 oz) (10/13 0100)  Intake/Output from previous day: 10/12 0701 - 10/13 0700 In: 2325 [I.V.:2325] Out: 0  Intake/Output this shift:   Nutritional status: Diet NPO time specified  Neurologic Exam: Mental Status: Eyes closed, no spontaneous eye opening. No speech. Does not follow commands.  Cranial Nerves: II: Does not blink to confrontation from the right, pupils equal, round, reactive to light  III,IV, VI: ptosis not present, with oculocephalic maneuver unable to go beyond midline to the right V,VII: right facial droop VIII: unable to test IX,X: gag reflex reduced Motor: Will intermittently have spontaneous movement of left UE, no movement of RUE or bilateral LE Sensory: minimally responds to noxious stimuli throughout  Lab Results: Basic Metabolic Panel:  Recent Labs Lab 10/30/14 1950 10/31/14 0429  NA 138 135  K 3.2* 3.2*  CL 98* 95*  CO2 24 22  GLUCOSE 254* 364*  BUN 15 13  CREATININE 1.31* 1.45*  CALCIUM 9.3 9.0    Liver Function Tests:  Recent Labs Lab 10/30/14 1950 10/31/14 0429  AST 42* 58*  ALT 28 31  ALKPHOS 68 62  BILITOT 1.3* 1.8*  PROT 7.5 7.0  ALBUMIN 4.4 4.0   No results for input(s): LIPASE, AMYLASE in the last 168 hours.  Recent Labs Lab 10/31/14 0344  AMMONIA 20    CBC:  Recent Labs Lab 10/30/14 1950 10/31/14 0429   WBC 12.6* 11.4*  NEUTROABS 11.5* 10.1*  HGB 15.7 15.8  HCT 46.2 46.6  MCV 90.9 91.9  PLT 208 207    Cardiac Enzymes:  Recent Labs Lab 10/30/14 1950 10/31/14 0344 10/31/14 0753  CKTOTAL 531* 1806*  --   TROPONINI  --  0.32* 0.32*    Lipid Panel: No results for input(s): CHOL, TRIG, HDL, CHOLHDL, VLDL, LDLCALC in the last 168 hours.  CBG:  Recent Labs Lab 10/30/14 2022 10/31/14 0044 10/31/14 0444 10/31/14 0832 10/31/14 0943  GLUCAP 211* 262* 336* 348* 366*    Microbiology: Results for orders placed or performed during the hospital encounter of 10/30/14  Fungus Culture with Smear     Status: None (Preliminary result)   Collection Time: 10/30/14 11:50 PM  Result Value Ref Range Status   Specimen Description CSF  Final   Special Requests NONE  Final   Fungal Smear   Final    NO YEAST OR FUNGAL ELEMENTS SEEN Performed at [facility]    Culture   Final    CULTURE IN PROGRESS FOR FOUR WEEKS Performed at [facility]    Report Status PENDING  Incomplete  CSF culture     Status: None (Preliminary result)   Collection Time: 10/30/14 11:55 PM  Result Value Ref Range Status   Specimen Description CSF  Final   Special Requests NONE  Final   Gram Stain  Final    CYTOSPIN SMEAR WBC PRESENT,BOTH PMN AND MONONUCLEAR NO ORGANISMS SEEN    Culture PENDING  Incomplete   Report Status PENDING  Incomplete    Coagulation Studies:  Recent Labs  10/30/14 1950 10/31/14 0429  LABPROT 13.8 14.3  INR 1.04 1.09    Imaging: Dg Chest 2 View  10/30/2014  CLINICAL DATA:  Altered mental status EXAM: CHEST  2 VIEW COMPARISON:  February 03, 2006 FINDINGS: There is no edema or consolidation. Heart is upper normal in size with pulmonary vascularity within normal limits. Patient is status post coronary artery bypass grafting. There is atherosclerotic change in the aorta. No adenopathy. No pneumothorax. No bone lesions. IMPRESSION: No edema or consolidation.  Electronically Signed   By: Lowella Grip III M.D.   On: 10/30/2014 19:50   Ct Head Wo Contrast  10/30/2014  CLINICAL DATA:  81 year old male with altered mental status, found down. EXAM: CT HEAD WITHOUT CONTRAST CT CERVICAL SPINE WITHOUT CONTRAST TECHNIQUE: Multidetector CT imaging of the head and cervical spine was performed following the standard protocol without intravenous contrast. Multiplanar CT image reconstructions of the cervical spine were also generated. COMPARISON:  None. FINDINGS: CT HEAD FINDINGS Atrophy, chronic small-vessel white matter ischemic changes and small remote bilateral cerebellar infarcts noted. No acute intracranial abnormalities are identified, including mass lesion or mass effect, hydrocephalus, extra-axial fluid collection, midline shift, hemorrhage, or acute infarction. The visualized bony calvarium is unremarkable. CT CERVICAL SPINE FINDINGS Normal alignment noted. There is no evidence of acute fracture, subluxation or prevertebral soft tissue swelling. Moderate to severe degenerative disc disease and spondylosis from C2-C6 identified contributing to moderate central spinal and bony foraminal narrowing. No focal bony lesions are identified. The soft tissue structures and lung apices are unremarkable. IMPRESSION: No evidence of acute intracranial abnormality. Atrophy, chronic small-vessel white matter ischemic changes and small remote bilateral cerebellar infarcts. No static evidence of acute injury to the cervical spine. Moderate to severe degenerative changes from C2-C6 contributing to moderate central spinal and foraminal narrowing. Electronically Signed   By: Margarette Canada M.D.   On: 10/30/2014 19:52   Ct Cervical Spine Wo Contrast  10/30/2014  CLINICAL DATA:  81 year old male with altered mental status, found down. EXAM: CT HEAD WITHOUT CONTRAST CT CERVICAL SPINE WITHOUT CONTRAST TECHNIQUE: Multidetector CT imaging of the head and cervical spine was performed following  the standard protocol without intravenous contrast. Multiplanar CT image reconstructions of the cervical spine were also generated. COMPARISON:  None. FINDINGS: CT HEAD FINDINGS Atrophy, chronic small-vessel white matter ischemic changes and small remote bilateral cerebellar infarcts noted. No acute intracranial abnormalities are identified, including mass lesion or mass effect, hydrocephalus, extra-axial fluid collection, midline shift, hemorrhage, or acute infarction. The visualized bony calvarium is unremarkable. CT CERVICAL SPINE FINDINGS Normal alignment noted. There is no evidence of acute fracture, subluxation or prevertebral soft tissue swelling. Moderate to severe degenerative disc disease and spondylosis from C2-C6 identified contributing to moderate central spinal and bony foraminal narrowing. No focal bony lesions are identified. The soft tissue structures and lung apices are unremarkable. IMPRESSION: No evidence of acute intracranial abnormality. Atrophy, chronic small-vessel white matter ischemic changes and small remote bilateral cerebellar infarcts. No static evidence of acute injury to the cervical spine. Moderate to severe degenerative changes from C2-C6 contributing to moderate central spinal and foraminal narrowing. Electronically Signed   By: Margarette Canada M.D.   On: 10/30/2014 19:52   Mr Brain Wo Contrast  10/30/2014  CLINICAL DATA:  Found down  on kitchen floor, last seen normal at noon today. Aphasic, LEFT-sided gaze. History of hypertension, diabetes, prostate cancer. EXAM: MRI HEAD WITHOUT CONTRAST TECHNIQUE: Multiplanar, multiecho pulse sequences of the brain and surrounding structures were obtained without intravenous contrast. COMPARISON:  CT head October 30, 2014 at 1928 hours FINDINGS: No reduced diffusion to suggest acute ischemia. Punctate foci of susceptibility artifact LEFT cerebellum, RIGHT frontal lobe are nonspecific. O bilateral small cerebellar infarcts. Old RIGHT basal  ganglia lacunar infarct. Ventricles are normal for age, mild sulcal effacement at the convexities. Patchy supratentorial white matter T2 hyperintensities without midline shift, mass effect or mass lesions. No abnormal extra-axial fluid collections. Normal major intracranial vascular flow voids seen at the skull base. Status post bilateral ocular lens implants. Small LEFT maxillary mucosal retention cyst without paranasal sinus air-fluid levels. Old LEFT medial orbital blowout fracture. Trace LEFT mastoid effusion. No abnormal sellar expansion. No cerebellar tonsillar ectopia. Patient is edentulous. IMPRESSION: No acute intracranial process. Involutional changes, superimposed component of suspected normal pressure hydrocephalus. Moderate white matter changes can be seen with chronic small vessel ischemic disease. 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 Electronically Signed   By: Elon Alas M.D.   On: 10/30/2014 22:32    Medications:  Scheduled: . cloNIDine  0.3 mg Transdermal Weekly  . insulin aspart  0-9 Units Subcutaneous Q4H  . insulin detemir  25 Units Subcutaneous Daily  . metoprolol  2.5 mg Intravenous 4 times per day  . vancomycin  750 mg Intravenous Q12H    Assessment/Plan:  81 y.o. male presenting after being found down, mute with right sided weakness and RHH.MRI brain unremarkable. Patient spiked fever overnight with leukocytosis noted. LP completed shows elevated protein but otherwise unremarkable. Unclear etiology of his presentation. Prelim read of EEG did not show any seizure activity.   -EEG formal read pending -agree with continued acyclovir pending HSV results -would hold on further AED at this time pending EEG results -check B12, TSH    LOS: 0 days   Jim Like, DO [facility] [phone]  If 7pm- 7am, please page neurology on call as listed in [place]. 10/31/2014  10:33 AM

## 2014-10-31 NOTE — ED Notes (Signed)
Dr. Raliegh Ip paged again for bp management.

## 2014-10-31 NOTE — ED Notes (Signed)
Pharmacy called regarding early administration of Acyclovir. Pharmacy confirms giving at scheduled time.

## 2014-10-31 NOTE — Progress Notes (Signed)
[facility]                                                                              Patient Demographics  John Romero, is a 81 y.o. male, DOB - [birth year], ZDG:[id number]  Admit date - 10/30/2014   Admitting Physician Rise Patience, MD  Outpatient Primary MD for the patient is [name], DO  LOS - 0   Chief Complaint  Patient presents with  . Altered Mental Status  . Fall  . stroke symptoms        Brief HPI   Per Dr. Moise Boring admit note on 10/31/14 John Romero is a 81 y.o. male with history of CAD status post CABG, diabetes mellitus type 2, hypertension, chronic kidney disease, hyperlipidemia and gout who was doing fine until last known was found to be on the floor when patient's daughter checked on him at his house last evening around 6 PM. As per patient's daughter patient has had dinner with his friend night before and also had breakfast and was seen normal by the neighbors until 1 PM yesterday. Patient was found to be confused not moving his right side and also having left-sided preferential gaze. Patient was brought to the ER CT head and C-spine was unremarkable and MRI of the brain was done which did not show any stroke. Neurologist was consulted and patient had lumbar puncture and patient was empirically started on acyclovir and Keppra at this time. On exam patient is still encephalopathic and does not follow commands. Patient is febrile and tachycardic. As per patient's daughter patient did not have any recent travel or sick contacts and only medication changes were fluid pills. Patient's blood pressures also found to be elevated. As per family patient did not have any recent complaints including chest pain headache shortness of breath nausea vomiting diarrhea abdominal pain.    Assessment & Plan    Principal Problem:   Acute encephalopathy unclear etiology, possibility of seizure or infectious source with fevers -  influenza panel  negative, chest x-ray negative, UA negative for UTI  - Patient underwent lumbar puncture and was placed on empiric antibiotics for meningitis.  - EEG showed no seizure or seizure predisposition. ammonia level is normal - MRI of the brain showed no acute intracranial process, involutional changes superimposed component of suspected normal pressure hydrocephalus - Neurology following, also requested ID consult, discussed with Dr. Karolee Ohs  Active Problems: Rhabdomyolysis -Follow CKs, gentle hydration  Mild elevated troponins - No complaints of any chest pain, obtain 2-D echocardiogram for further workup     mild acute on Chronic kidney disease, stage 3Likely due to hypertensive urgency -Continue gentle hydration    Hypertensive urgency - Continue clonidine patch, scheduled Lopressor, hydralazine and labetalol as needed    DM (diabetes mellitus), type 2 with renal complications (HCC) - Continue Lantus, sliding scale insulin   Code Status: Full CODE STATUS   Family Communication: Discussed in detail with the patient, all imaging results, lab results explained to thpatient's daughter at the bedside   Disposition Plan:Unclear   Time Spent in minutes   25 minutes  Procedures  MRI  brain   spinal tap  EEG  Consults   Neurology   infectious disease  DVT Prophylaxis   SCD's  Medications  Scheduled Meds: . acyclovir  800 mg Intravenous 3 times per day  . ampicillin (OMNIPEN) IV  2 g Intravenous 6 times per day  . cefTRIAXone (ROCEPHIN)  IV  2 g Intravenous Q12H  . cloNIDine  0.3 mg Transdermal Weekly  . insulin aspart  0-9 Units Subcutaneous Q4H  . insulin detemir  25 Units Subcutaneous Daily  . metoprolol  2.5 mg Intravenous 4 times per day  . potassium chloride  10 mEq Intravenous Q1 Hr x 3  . vancomycin  750 mg Intravenous Q12H   Continuous Infusions: . sodium chloride 125 mL/hr at 10/31/14 0501   PRN Meds:.hydrALAZINE   Antibiotics   Anti-infectives     Start     Dose/Rate Route Frequency Ordered Stop   10/31/14 1200  vancomycin (VANCOCIN) IVPB 750 mg/150 ml premix     750 mg 150 mL/hr over 60 Minutes Intravenous Every 12 hours 10/31/14 0123     10/31/14 0730  acyclovir (ZOVIRAX) 800 mg in dextrose 5 % 150 mL IVPB     800 mg 166 mL/hr over 60 Minutes Intravenous 3 times per day 10/31/14 0123     10/31/14 0130  vancomycin (VANCOCIN) IVPB 1000 mg/200 mL premix     1,000 mg 200 mL/hr over 60 Minutes Intravenous STAT 10/31/14 0123 10/31/14 0429   10/31/14 0130  cefTRIAXone (ROCEPHIN) 2 g in dextrose 5 % 50 mL IVPB     2 g 100 mL/hr over 30 Minutes Intravenous Every 12 hours 10/31/14 0123     10/31/14 0115  ampicillin (OMNIPEN) 2 g in sodium chloride 0.9 % 50 mL IVPB     2 g 150 mL/hr over 20 Minutes Intravenous 6 times per day 10/31/14 0114     10/30/14 2345  acyclovir (ZOVIRAX) 800 mg in dextrose 5 % 150 mL IVPB     800 mg 166 mL/hr over 60 Minutes Intravenous  Once 10/30/14 2343 10/31/14 0207        Subjective:   John Romero was seen and examined today. The patient is somnolent, withdraws to pain, unable to obtain any debris assistant from the patient. Continues to spike fevers. No seizure activity.   Objective:   Blood pressure 169/82, pulse 105, temperature 101.4 F (38.6 C), temperature source Rectal, resp. rate 23, height 5' 10.87" (1.8 m), weight 94.1 kg (207 lb 7.3 oz), SpO2 95 %.  Wt Readings from Last 3 Encounters:  10/31/14 94.1 kg (207 lb 7.3 oz)  02/19/14 94.121 kg (207 lb 8 oz)  10/17/13 94.167 kg (207 lb 9.6 oz)     Intake/Output Summary (Last 24 hours) at 10/31/14 1436 Last data filed at 10/31/14 1200  Gross per 24 hour  Intake   2325 ml  Output   1000 ml  Net   1325 ml    Exam  General:  Lethargic, somnolent,  HEENT:  PERRLA, EOMI, Anicteric Sclera, mucous membranes moist.   Neck: Supple, no JVD, no masses  CVS: S1 S2 auscultated, no rubs, murmurs or gallops. Regular rate and  rhythm.  Respiratory: Clear to auscultation bilaterally, no wheezing, rales or rhonchi  Abdomen: Soft, nontender, nondistended, + bowel sounds  Ext: no cyanosis clubbing or edema  Neuro: withdraws to pain otherwise unable to assess  Skin: No rashes  Psych: Somnolent and lethargic   Data Review   Micro Results Recent Results (  from the past 240 hour(s))  Fungus Culture with Smear     Status: None (Preliminary result)   Collection Time: 10/30/14 11:50 PM  Result Value Ref Range Status   Specimen Description CSF  Final   Special Requests NONE  Final   Fungal Smear   Final    NO YEAST OR FUNGAL ELEMENTS SEEN Performed at [facility]    Culture   Final    CULTURE IN PROGRESS FOR FOUR WEEKS Performed at [facility]    Report Status PENDING  Incomplete  CSF culture     Status: None (Preliminary result)   Collection Time: 10/30/14 11:55 PM  Result Value Ref Range Status   Specimen Description CSF  Final   Special Requests NONE  Final   Gram Stain   Final    CYTOSPIN SMEAR WBC PRESENT,BOTH PMN AND MONONUCLEAR NO ORGANISMS SEEN    Culture PENDING  Incomplete   Report Status PENDING  Incomplete    Radiology Reports Dg Chest 2 View  10/30/2014  CLINICAL DATA:  Altered mental status EXAM: CHEST  2 VIEW COMPARISON:  February 03, 2006 FINDINGS: There is no edema or consolidation. Heart is upper normal in size with pulmonary vascularity within normal limits. Patient is status post coronary artery bypass grafting. There is atherosclerotic change in the aorta. No adenopathy. No pneumothorax. No bone lesions. IMPRESSION: No edema or consolidation. Electronically Signed   By: Lowella Grip III M.D.   On: 10/30/2014 19:50   Ct Head Wo Contrast  10/30/2014  CLINICAL DATA:  81 year old male with altered mental status, found down. EXAM: CT HEAD WITHOUT CONTRAST CT CERVICAL SPINE WITHOUT CONTRAST TECHNIQUE: Multidetector CT imaging of the head and cervical spine was  performed following the standard protocol without intravenous contrast. Multiplanar CT image reconstructions of the cervical spine were also generated. COMPARISON:  None. FINDINGS: CT HEAD FINDINGS Atrophy, chronic small-vessel white matter ischemic changes and small remote bilateral cerebellar infarcts noted. No acute intracranial abnormalities are identified, including mass lesion or mass effect, hydrocephalus, extra-axial fluid collection, midline shift, hemorrhage, or acute infarction. The visualized bony calvarium is unremarkable. CT CERVICAL SPINE FINDINGS Normal alignment noted. There is no evidence of acute fracture, subluxation or prevertebral soft tissue swelling. Moderate to severe degenerative disc disease and spondylosis from C2-C6 identified contributing to moderate central spinal and bony foraminal narrowing. No focal bony lesions are identified. The soft tissue structures and lung apices are unremarkable. IMPRESSION: No evidence of acute intracranial abnormality. Atrophy, chronic small-vessel white matter ischemic changes and small remote bilateral cerebellar infarcts. No static evidence of acute injury to the cervical spine. Moderate to severe degenerative changes from C2-C6 contributing to moderate central spinal and foraminal narrowing. Electronically Signed   By: Margarette Canada M.D.   On: 10/30/2014 19:52   Ct Cervical Spine Wo Contrast  10/30/2014  CLINICAL DATA:  81 year old male with altered mental status, found down. EXAM: CT HEAD WITHOUT CONTRAST CT CERVICAL SPINE WITHOUT CONTRAST TECHNIQUE: Multidetector CT imaging of the head and cervical spine was performed following the standard protocol without intravenous contrast. Multiplanar CT image reconstructions of the cervical spine were also generated. COMPARISON:  None. FINDINGS: CT HEAD FINDINGS Atrophy, chronic small-vessel white matter ischemic changes and small remote bilateral cerebellar infarcts noted. No acute intracranial  abnormalities are identified, including mass lesion or mass effect, hydrocephalus, extra-axial fluid collection, midline shift, hemorrhage, or acute infarction. The visualized bony calvarium is unremarkable. CT CERVICAL SPINE FINDINGS Normal alignment noted. There is  no evidence of acute fracture, subluxation or prevertebral soft tissue swelling. Moderate to severe degenerative disc disease and spondylosis from C2-C6 identified contributing to moderate central spinal and bony foraminal narrowing. No focal bony lesions are identified. The soft tissue structures and lung apices are unremarkable. IMPRESSION: No evidence of acute intracranial abnormality. Atrophy, chronic small-vessel white matter ischemic changes and small remote bilateral cerebellar infarcts. No static evidence of acute injury to the cervical spine. Moderate to severe degenerative changes from C2-C6 contributing to moderate central spinal and foraminal narrowing. Electronically Signed   By: Margarette Canada M.D.   On: 10/30/2014 19:52   Mr Brain Wo Contrast  10/30/2014  CLINICAL DATA:  Found down on kitchen floor, last seen normal at noon today. Aphasic, LEFT-sided gaze. History of hypertension, diabetes, prostate cancer. EXAM: MRI HEAD WITHOUT CONTRAST TECHNIQUE: Multiplanar, multiecho pulse sequences of the brain and surrounding structures were obtained without intravenous contrast. COMPARISON:  CT head October 30, 2014 at 1928 hours FINDINGS: No reduced diffusion to suggest acute ischemia. Punctate foci of susceptibility artifact LEFT cerebellum, RIGHT frontal lobe are nonspecific. O bilateral small cerebellar infarcts. Old RIGHT basal ganglia lacunar infarct. Ventricles are normal for age, mild sulcal effacement at the convexities. Patchy supratentorial white matter T2 hyperintensities without midline shift, mass effect or mass lesions. No abnormal extra-axial fluid collections. Normal major intracranial vascular flow voids seen at the skull base.  Status post bilateral ocular lens implants. Small LEFT maxillary mucosal retention cyst without paranasal sinus air-fluid levels. Old LEFT medial orbital blowout fracture. Trace LEFT mastoid effusion. No abnormal sellar expansion. No cerebellar tonsillar ectopia. Patient is edentulous. IMPRESSION: No acute intracranial process. Involutional changes, superimposed component of suspected normal pressure hydrocephalus. Moderate white matter changes can be seen with chronic small vessel ischemic disease. 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 Electronically Signed   By: Elon Alas M.D.   On: 10/30/2014 22:32    CBC  Recent Labs Lab 10/30/14 1950 10/31/14 0429  WBC 12.6* 11.4*  HGB 15.7 15.8  HCT 46.2 46.6  PLT 208 207  MCV 90.9 91.9  MCH 30.9 31.2  MCHC 34.0 33.9  RDW 13.0 13.4  LYMPHSABS 0.8 1.0  MONOABS 0.3 0.3  EOSABS 0.0 0.0  BASOSABS 0.0 0.0    Chemistries   Recent Labs Lab 10/30/14 1950 10/31/14 0429  NA 138 135  K 3.2* 3.2*  CL 98* 95*  CO2 24 22  GLUCOSE 254* 364*  BUN 15 13  CREATININE 1.31* 1.45*  CALCIUM 9.3 9.0  AST 42* 58*  ALT 28 31  ALKPHOS 68 62  BILITOT 1.3* 1.8*   ------------------------------------------------------------------------------------------------------------------ estimated creatinine clearance is 45.9 mL/min (by C-G formula based on Cr of 1.45). ------------------------------------------------------------------------------------------------------------------ No results for input(s): HGBA1C in the last 72 hours. ------------------------------------------------------------------------------------------------------------------ No results for input(s): CHOL, HDL, LDLCALC, TRIG, CHOLHDL, LDLDIRECT in the last 72 hours. ------------------------------------------------------------------------------------------------------------------  Recent Labs  10/31/14 1226  TSH 1.004    ------------------------------------------------------------------------------------------------------------------  Recent Labs  10/31/14 1226  VITAMINB12 418    Coagulation profile  Recent Labs Lab 10/30/14 1950 10/31/14 0429  INR 1.04 1.09    No results for input(s): DDIMER in the last 72 hours.  Cardiac Enzymes  Recent Labs Lab 10/31/14 0344 10/31/14 0753 10/31/14 1226  TROPONINI 0.32* 0.32* 0.39*   ------------------------------------------------------------------------------------------------------------------ Invalid input(s): POCBNP   Recent Labs  10/30/14 2022 10/31/14 0044 10/31/14 0444 10/31/14 0832 10/31/14 0943 10/31/14 1057  GLUCAP 211* 262* 336* 348* 366* 331*  RAI,RIPUDEEP M.D. [facility] 10/31/2014, 2:36 PM  Pager: [phone] Between 7am to 7pm - call Pager - [phone]  After 7pm go to [email or web address] - password [email or web address]  Call night coverage person covering after 7pm

## 2014-10-31 NOTE — Procedures (Signed)
History: 81 yo M with altered mental status  Sedation: none  Technique: This is a 19 channel routine scalp EEG performed at the bedside with bipolar and monopolar montages arranged in accordance to the international 10/20 system of electrode placement. One channel was dedicated to EKG recording.    Background: The background is asymmetric with attenuation of faster frequencies on the left. There is mild genrealized irregular delta and theta that appears relatively symmetric. The PDR on the right achieves a frequency of 8.5 Hz.  Photic stimulation: Physiologic driving is not performed  EEG Abnormalities: 1) Asymmetric PDR 2) Generalized slowactivity  Clinical Interpretation: This EEG is consistent with a focal left hemispheric dysfunction in the setting of a generalized non-specific cerebral dysfunction(encephalopathy). There was no seizure or seizure predisposition recorded on this study.   John Rack, MD [facility] [phone]  If 7pm- 7am, please page neurology on call as listed in [place].

## 2014-10-31 NOTE — Progress Notes (Signed)
EEG completed, results pending. 

## 2014-10-31 NOTE — Progress Notes (Signed)
ANTIBIOTIC CONSULT NOTE - INITIAL  Pharmacy Consult for Vancocin, Rocephin, and acyclovir Indication: r/o meningitis  No Known Allergies  Patient Measurements: Height: 5' 10.87" (180 cm) Weight: 207 lb 7.3 oz (94.1 kg) IBW/kg (Calculated) : 74.99  Vital Signs: Temp: 101.6 F (38.7 C) (10/13 0059) Temp Source: Rectal (10/13 0059) BP: 215/111 mmHg (10/13 0030) Pulse Rate: 114 (10/13 0030)  Labs:  Recent Labs  10/30/14 1950  WBC 12.6*  HGB 15.7  PLT 208  CREATININE 1.31*   Estimated Creatinine Clearance: 50.8 mL/min (by C-G formula based on Cr of 1.31).   Microbiology: Recent Results (from the past 720 hour(s))  CSF culture     Status: None (Preliminary result)   Collection Time: 10/30/14 11:55 PM  Result Value Ref Range Status   Specimen Description CSF  Final   Special Requests NONE  Final   Gram Stain   Final    CYTOSPIN SMEAR WBC PRESENT,BOTH PMN AND MONONUCLEAR NO ORGANISMS SEEN    Culture PENDING  Incomplete   Report Status PENDING  Incomplete    Medical History: Past Medical History  Diagnosis Date  . Type II or unspecified type diabetes mellitus without mention of complication, uncontrolled   . Other and unspecified hyperlipidemia   . Gout, unspecified   . Unspecified essential hypertension   . Type II or unspecified type diabetes mellitus with renal manifestations, uncontrolled   . 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 Gout, unspecified   . Type II or unspecified type diabetes mellitus without mention of complication, uncontrolled   . First degree atrioventricular block   . Malignant neoplasm of prostate ([place])   . Malignant neoplasm of prostate ([place])   . Type II or unspecified type diabetes mellitus without mention of complication, not stated as uncontrolled   . Other and unspecified hyperlipidemia   . Unspecified tinnitus   . Unspecified essential hypertension   . Old myocardial infarction   . Coronary  atherosclerosis of unspecified type of vessel, native or graft   . Edema   . Undiagnosed cardiac murmurs   . Undiagnosed cardiac murmurs      Assessment: 81yo male presents w/ AMS, fall, and stroke sx, CT and MRI negative, concern for infectious process vs sz, to begin IV ABX for possible meningitis.  Goal of Therapy:  Vancomycin trough level 15-20 mcg/ml  Plan:  Rec'd acyclovir 800mg  in ED and ampicillin 2g Q4H has been ordered; will give vancomycin 1g IV now followed by 750mg  IV Q12H as well as Rocephin 2g IV Q12H and acyclovir 800mg  IV Q8H and monitor CBC, Cx, levels prn.  Wynona Neat, PharmD, BCPS  10/31/2014,1:16 AM

## 2014-10-31 NOTE — ED Notes (Signed)
Called for hospital bed

## 2014-10-31 NOTE — H&P (Signed)
[facility] History and Physical  John Romero TGG:[id number] DOB: 08-06-1930 DOA: 10/30/2014  Referring physician: Dr.Liu. PCP: [name], DO Dr. Felipa Eth. Specialists: None.  History obtained from patient's daughter.  Chief Complaint: Altered mental status.  HPI: John Romero is a 81 y.o. male with history of CAD status post CABG, diabetes mellitus type 2, hypertension, chronic kidney disease, hyperlipidemia and gout who was doing fine until last known was found to be on the floor when patient's daughter checked on him at his house last evening around 6 PM. As per patient's daughter patient has had dinner with his friend night before and also had breakfast and was seen normal by the neighbors until 1 PM yesterday. Patient was found to be confused not moving his right side and also having left-sided preferential gaze. Patient was brought to the ER CT head and C-spine was unremarkable and MRI of the brain was done which did not show any stroke. Neurologist was consulted and patient had lumbar puncture and patient was empirically started on acyclovir and Keppra at this time. On exam patient is still encephalopathic and does not follow commands. Patient is febrile and tachycardic. As per patient's daughter patient did not have any recent travel or sick contacts and only medication changes were fluid pills. Patient's blood pressures also found to be elevated. As per family patient did not have any recent complaints including chest pain headache shortness of breath nausea vomiting diarrhea abdominal pain.   Review of Systems: As presented in the history of presenting illness, rest negative.  Past Medical History  Diagnosis Date  . Type II or unspecified type diabetes mellitus without mention of complication, uncontrolled   . Other and unspecified hyperlipidemia   . Gout, unspecified   . Unspecified essential hypertension   . Type II or unspecified type diabetes mellitus with renal  manifestations, uncontrolled   . 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 Gout, unspecified   . Type II or unspecified type diabetes mellitus without mention of complication, uncontrolled   . First degree atrioventricular block   . Malignant neoplasm of prostate ([place])   . Malignant neoplasm of prostate ([place])   . Type II or unspecified type diabetes mellitus without mention of complication, not stated as uncontrolled   . Other and unspecified hyperlipidemia   . Unspecified tinnitus   . Unspecified essential hypertension   . Old myocardial infarction   . Coronary atherosclerosis of unspecified type of vessel, native or graft   . Edema   . Undiagnosed cardiac murmurs   . Undiagnosed cardiac murmurs    Past Surgical History  Procedure Laterality Date  . Appendectomy    . Prostate surgery    . Coronary artery bypass graft    . Eye surgery  2014   Social History:  reports that he has never smoked. His smokeless tobacco use includes Chew. He reports that he does not drink alcohol or use illicit drugs. Where does patient live home. Can patient participate in ADLs? Yes.  No Known Allergies  Family History:  Family History  Problem Relation Age of Onset  . Diabetes Mother   . Heart disease Father   . Cancer Sister     lung  . Cancer Sister     lung      Prior to Admission medications   Medication Sig Start Date End Date Taking? Authorizing Provider  allopurinol (ZYLOPRIM) 300 MG tablet TAKE 1 TABLET DAILY TO PREVENT GOUT ATTACK Patient  taking differently: TAKE 1/2 TABLET DAILY TO PREVENT GOUT ATTACK, take another 1/2 tablet daily if having a gout attack 02/11/14  Yes Blanchie Serve, MD  aspirin 325 MG EC tablet Take 325 mg by mouth daily.   Yes Historical Provider, MD  cloNIDine (CATAPRES) 0.3 MG tablet Take one tablet by mouth twice daily for blood pressure 09/20/14  Yes [name], DO  furosemide (LASIX) 40 MG tablet Take 40 mg by mouth daily at 12  noon.  10/22/14  Yes Historical Provider, MD  insulin aspart (NOVOLOG) 100 UNIT/ML injection Inject 6-10 Units into the skin 3 (three) times daily with meals. 6 unit for blood sugar 150-250 and 10 u for blood sugar 250-350 02/19/14  Yes Mahima Pandey, MD  Insulin Detemir (LEVEMIR FLEXPEN) 100 UNIT/ML Pen Inject 38 units once daily to control blood sugar *Please send 90 day supply* 02/19/14  Yes Blanchie Serve, MD  metoprolol tartrate (LOPRESSOR) 25 MG tablet Take 1/2 tablet by mouth twice daily to control heart rhythm 02/26/14  Yes Blanchie Serve, MD  Multiple Vitamin (MULTIVITAMIN) tablet Take 1 tablet by mouth daily.   Yes Historical Provider, MD  potassium chloride SA (K-DUR,KLOR-CON) 20 MEQ tablet TAKE 1 TABLET DAILY. FOR LOW POTASSIUM 10/21/14  Yes [name], DO  ramipril (ALTACE) 10 MG capsule Take one capsule by mouth twice daily to control blood pressure 02/26/14  Yes Mahima Pandey, MD  simvastatin (ZOCOR) 20 MG tablet APPOINTMENT OVERDUE, 1 by mouth daily to lower cholesterol 10/01/14  Yes [name], DO  amLODipine (NORVASC) 10 MG tablet TAKE 1 TABLET DAILY FOR BLOOD PRESSURE Patient not taking: Reported on 10/30/2014 02/19/14   Estill Dooms, MD  furosemide (LASIX) 20 MG tablet TAKE 1 TABLET DAILY Patient not taking: Reported on 10/30/2014 07/29/14   [name], DO  hydrochlorothiazide (HYDRODIURIL) 50 MG tablet Take one tablet by mouth once daily for blood pressure Patient not taking: Reported on 10/30/2014 09/11/13   Estill Dooms, MD  Insulin Pen Needle (B-D ULTRAFINE III SHORT PEN) 31G X 8 MM MISC Use 2 pen needles daily with the administration of Insulin. DX 250.00 06/06/13   Mahima Bubba Camp, MD  LEVEMIR FLEXTOUCH 100 UNIT/ML Pen INJECT 35 UNITS ONCE DAILY TO CONTROL BLOOD SUGAR Patient not taking: Reported on 10/30/2014 07/10/14   [name], DO  NOVOLOG FLEXPEN 100 UNIT/ML FlexPen INJECT 6 UNITS INTO THE SKIN THREE TIMES A DAY WITH MEALS Patient not taking: Reported on 10/30/2014  07/10/14   [name], DO    Physical Exam: Filed Vitals:   10/30/14 2345 10/31/14 0000 10/31/14 0030 10/31/14 0059  BP: 161/79 204/100 215/111   Pulse: 110 108 114   Temp:    101.6 F (38.7 C)  TempSrc:    Rectal  Resp: 22 26 21    SpO2: 90% 96% 96%      General:  Moderately built and nourished.  Eyes: Anicteric no pallor.  ENT: No discharge from the ears eyes nose and mouth.  Neck: No neck rigidity. No mass felt.  Cardiovascular: S1 and S2 heard.  Respiratory: No rhonchi or crepitations.  Abdomen: Soft nontender bowel sounds present.  Skin: Chronic skin changes.  Musculoskeletal: Mild edema over the both lower extremities.  Psychiatric: Patient is encephalopathic.  Neurologic: Patient is encephalopathic and does not follow commands.  Labs on Admission:  Basic Metabolic Panel:  Recent Labs Lab 10/30/14 1950  NA 138  K 3.2*  CL 98*  CO2 24  GLUCOSE 254*  BUN 15  CREATININE  1.31*  CALCIUM 9.3   Liver Function Tests:  Recent Labs Lab 10/30/14 1950  AST 42*  ALT 28  ALKPHOS 68  BILITOT 1.3*  PROT 7.5  ALBUMIN 4.4   No results for input(s): LIPASE, AMYLASE in the last 168 hours. No results for input(s): AMMONIA in the last 168 hours. CBC:  Recent Labs Lab 10/30/14 1950  WBC 12.6*  NEUTROABS 11.5*  HGB 15.7  HCT 46.2  MCV 90.9  PLT 208   Cardiac Enzymes:  Recent Labs Lab 10/30/14 1950  CKTOTAL 531*    BNP (last 3 results) No results for input(s): BNP in the last 8760 hours.  ProBNP (last 3 results) No results for input(s): PROBNP in the last 8760 hours.  CBG:  Recent Labs Lab 10/30/14 2022 10/31/14 0044  GLUCAP 211* 262*    Radiological Exams on Admission: Dg Chest 2 View  10/30/2014  CLINICAL DATA:  Altered mental status EXAM: CHEST  2 VIEW COMPARISON:  February 03, 2006 FINDINGS: There is no edema or consolidation. Heart is upper normal in size with pulmonary vascularity within normal limits. Patient is status post  coronary artery bypass grafting. There is atherosclerotic change in the aorta. No adenopathy. No pneumothorax. No bone lesions. IMPRESSION: No edema or consolidation. Electronically Signed   By: Lowella Grip III M.D.   On: 10/30/2014 19:50   Ct Head Wo Contrast  10/30/2014  CLINICAL DATA:  81 year old male with altered mental status, found down. EXAM: CT HEAD WITHOUT CONTRAST CT CERVICAL SPINE WITHOUT CONTRAST TECHNIQUE: Multidetector CT imaging of the head and cervical spine was performed following the standard protocol without intravenous contrast. Multiplanar CT image reconstructions of the cervical spine were also generated. COMPARISON:  None. FINDINGS: CT HEAD FINDINGS Atrophy, chronic small-vessel white matter ischemic changes and small remote bilateral cerebellar infarcts noted. No acute intracranial abnormalities are identified, including mass lesion or mass effect, hydrocephalus, extra-axial fluid collection, midline shift, hemorrhage, or acute infarction. The visualized bony calvarium is unremarkable. CT CERVICAL SPINE FINDINGS Normal alignment noted. There is no evidence of acute fracture, subluxation or prevertebral soft tissue swelling. Moderate to severe degenerative disc disease and spondylosis from C2-C6 identified contributing to moderate central spinal and bony foraminal narrowing. No focal bony lesions are identified. The soft tissue structures and lung apices are unremarkable. IMPRESSION: No evidence of acute intracranial abnormality. Atrophy, chronic small-vessel white matter ischemic changes and small remote bilateral cerebellar infarcts. No static evidence of acute injury to the cervical spine. Moderate to severe degenerative changes from C2-C6 contributing to moderate central spinal and foraminal narrowing. Electronically Signed   By: Margarette Canada M.D.   On: 10/30/2014 19:52   Ct Cervical Spine Wo Contrast  10/30/2014  CLINICAL DATA:  81 year old male with altered mental status,  found down. EXAM: CT HEAD WITHOUT CONTRAST CT CERVICAL SPINE WITHOUT CONTRAST TECHNIQUE: Multidetector CT imaging of the head and cervical spine was performed following the standard protocol without intravenous contrast. Multiplanar CT image reconstructions of the cervical spine were also generated. COMPARISON:  None. FINDINGS: CT HEAD FINDINGS Atrophy, chronic small-vessel white matter ischemic changes and small remote bilateral cerebellar infarcts noted. No acute intracranial abnormalities are identified, including mass lesion or mass effect, hydrocephalus, extra-axial fluid collection, midline shift, hemorrhage, or acute infarction. The visualized bony calvarium is unremarkable. CT CERVICAL SPINE FINDINGS Normal alignment noted. There is no evidence of acute fracture, subluxation or prevertebral soft tissue swelling. Moderate to severe degenerative disc disease and spondylosis from C2-C6 identified contributing to moderate  central spinal and bony foraminal narrowing. No focal bony lesions are identified. The soft tissue structures and lung apices are unremarkable. IMPRESSION: No evidence of acute intracranial abnormality. Atrophy, chronic small-vessel white matter ischemic changes and small remote bilateral cerebellar infarcts. No static evidence of acute injury to the cervical spine. Moderate to severe degenerative changes from C2-C6 contributing to moderate central spinal and foraminal narrowing. Electronically Signed   By: Margarette Canada M.D.   On: 10/30/2014 19:52   Mr Brain Wo Contrast  10/30/2014  CLINICAL DATA:  Found down on kitchen floor, last seen normal at noon today. Aphasic, LEFT-sided gaze. History of hypertension, diabetes, prostate cancer. EXAM: MRI HEAD WITHOUT CONTRAST TECHNIQUE: Multiplanar, multiecho pulse sequences of the brain and surrounding structures were obtained without intravenous contrast. COMPARISON:  CT head October 30, 2014 at 1928 hours FINDINGS: No reduced diffusion to suggest  acute ischemia. Punctate foci of susceptibility artifact LEFT cerebellum, RIGHT frontal lobe are nonspecific. O bilateral small cerebellar infarcts. Old RIGHT basal ganglia lacunar infarct. Ventricles are normal for age, mild sulcal effacement at the convexities. Patchy supratentorial white matter T2 hyperintensities without midline shift, mass effect or mass lesions. No abnormal extra-axial fluid collections. Normal major intracranial vascular flow voids seen at the skull base. Status post bilateral ocular lens implants. Small LEFT maxillary mucosal retention cyst without paranasal sinus air-fluid levels. Old LEFT medial orbital blowout fracture. Trace LEFT mastoid effusion. No abnormal sellar expansion. No cerebellar tonsillar ectopia. Patient is edentulous. IMPRESSION: No acute intracranial process. Involutional changes, superimposed component of suspected normal pressure hydrocephalus. Moderate white matter changes can be seen with chronic small vessel ischemic disease. 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 Electronically Signed   By: Elon Alas M.D.   On: 10/30/2014 22:32    EKG: Independently reviewed. Sinus tachycardia.  Assessment/Plan Principal Problem:   Acute encephalopathy Active Problems:   Chronic kidney disease, stage 3   Gout, chronic   Hypertensive urgency   DM (diabetes mellitus), type 2 with renal complications ([place])   1. Acute encephalopathy - cause not clear differential does include infectious source, seizure. I have discussed with on-call neurologist Dr. Doy Mince and at this time patient has received 1 dose of Keppra and further doses will be decided by Dr. Doy Mince. We will continue with acyclovir for possible encephalitis and patient is also placed on vancomycin ceftriaxone and ampicillin for possible meningitis. Follow lumbar puncture labs and blood cultures and urine cultures. Check lactic acid and procalcitonin levels and  gently hydrated given the history of possible CHF. EEG has been ordered. Check influenza PCR. Check ammonia levels. 2. Hypertensive urgency - since patient is nothing by mouth I have placed patient on clonidine patch with when necessary IV hydralazine and also scheduled dose of IV metoprolol. Closely follow blood pressure trends. 3. Diabetes mellitus type 2 - since patient is nothing by mouth I have decrease patient's Levemir dose to 25 units with sliding scale coverage. 4. History of CAD status post CABG and possible CHF - check troponins. 5. Mild rhabdomyolysis probably from fall - follow CK. 6. History of gout - will resume those medications once patient is more alert and awake. 7. Chronic kidney disease stage III - closely follow metabolic panel.  I have reviewed patient's old charts and labs. Personally reviewed patient's chest x-ray and EKG.  I have discussed patient's daughter who is patient's healthcare power of attorney. As per patient's daughter patient at this time will be a full code but  previously he had a DO NOT RESUSCITATE.   DVT Prophylaxis SCDs as patient just had lumbar puncture.  Code Status: Full code. See discussion above.  Family Communication: Discussed patient's daughter who is the healthcare power of attorney.  Disposition Plan: Admit to inpatient.    John Pigman N. [facility] Pager [phone].  If 7PM-7AM, please contact night-coverage [email or web address] Password [email or web address] 10/31/2014, 1:14 AM

## 2014-10-31 NOTE — ED Notes (Signed)
EEG at bedside.

## 2014-11-01 ENCOUNTER — Inpatient Hospital Stay (HOSPITAL_COMMUNITY): Payer: Medicare Other

## 2014-11-01 ENCOUNTER — Encounter (HOSPITAL_COMMUNITY): Payer: Self-pay | Admitting: Cardiology

## 2014-11-01 DIAGNOSIS — I159 Secondary hypertension, unspecified: Secondary | ICD-10-CM

## 2014-11-01 DIAGNOSIS — I469 Cardiac arrest, cause unspecified: Secondary | ICD-10-CM

## 2014-11-01 DIAGNOSIS — Z951 Presence of aortocoronary bypass graft: Secondary | ICD-10-CM

## 2014-11-01 DIAGNOSIS — I480 Paroxysmal atrial fibrillation: Secondary | ICD-10-CM

## 2014-11-01 DIAGNOSIS — I2510 Atherosclerotic heart disease of native coronary artery without angina pectoris: Secondary | ICD-10-CM

## 2014-11-01 DIAGNOSIS — R7989 Other specified abnormal findings of blood chemistry: Secondary | ICD-10-CM

## 2014-11-01 DIAGNOSIS — R778 Other specified abnormalities of plasma proteins: Secondary | ICD-10-CM | POA: Diagnosis present

## 2014-11-01 DIAGNOSIS — D72829 Elevated white blood cell count, unspecified: Secondary | ICD-10-CM

## 2014-11-01 LAB — CBC
HCT: 44.2 % (ref 39.0–52.0)
Hemoglobin: 14.8 g/dL (ref 13.0–17.0)
MCH: 31 pg (ref 26.0–34.0)
MCHC: 33.5 g/dL (ref 30.0–36.0)
MCV: 92.7 fL (ref 78.0–100.0)
PLATELETS: 185 10*3/uL (ref 150–400)
RBC: 4.77 MIL/uL (ref 4.22–5.81)
RDW: 13.9 % (ref 11.5–15.5)
WBC: 15.4 10*3/uL — ABNORMAL HIGH (ref 4.0–10.5)

## 2014-11-01 LAB — COMPREHENSIVE METABOLIC PANEL
ALBUMIN: 3.5 g/dL (ref 3.5–5.0)
ALT: 26 U/L (ref 17–63)
AST: 54 U/L — ABNORMAL HIGH (ref 15–41)
Alkaline Phosphatase: 46 U/L (ref 38–126)
Anion gap: 12 (ref 5–15)
BUN: 18 mg/dL (ref 6–20)
CO2: 25 mmol/L (ref 22–32)
Calcium: 9 mg/dL (ref 8.9–10.3)
Chloride: 105 mmol/L (ref 101–111)
Creatinine, Ser: 1.23 mg/dL (ref 0.61–1.24)
GFR calc Af Amer: >60 mL/min (ref >60)
GFR calc non Af Amer: 53 mL/min — ABNORMAL LOW (ref >60)
GLUCOSE: 199 mg/dL — AB (ref 65–99)
POTASSIUM: 3 mmol/L — AB (ref 3.5–5.1)
SODIUM: 142 mmol/L (ref 135–145)
TOTAL PROTEIN: 6.4 g/dL — AB (ref 6.5–8.1)
Total Bilirubin: 1.1 mg/dL (ref 0.3–1.2)

## 2014-11-01 LAB — GLUCOSE, CAPILLARY
GLUCOSE-CAPILLARY: 177 mg/dL — AB (ref 65–99)
GLUCOSE-CAPILLARY: 188 mg/dL — AB (ref 65–99)
GLUCOSE-CAPILLARY: 172 mg/dL — AB (ref 65–99)
Glucose-Capillary: 159 mg/dL — ABNORMAL HIGH (ref 65–99)
Glucose-Capillary: 188 mg/dL — ABNORMAL HIGH (ref 65–99)
Glucose-Capillary: 244 mg/dL — ABNORMAL HIGH (ref 65–99)

## 2014-11-01 LAB — HERPES SIMPLEX VIRUS(HSV) DNA BY PCR
HSV 1 DNA: NEGATIVE
HSV 2 DNA: NEGATIVE

## 2014-11-01 LAB — PROCALCITONIN: Procalcitonin: <0.1 ng/mL

## 2014-11-01 LAB — CK: CK TOTAL: 1626 U/L — AB (ref 49–397)

## 2014-11-01 LAB — HEPARIN LEVEL (UNFRACTIONATED): Heparin Unfractionated: 0.1 IU/mL — ABNORMAL LOW (ref 0.30–0.70)

## 2014-11-01 LAB — MRSA PCR SCREENING: MRSA by PCR: NEGATIVE

## 2014-11-01 LAB — MAGNESIUM: Magnesium: 2 mg/dL (ref 1.7–2.4)

## 2014-11-01 MED ORDER — DILTIAZEM HCL 100 MG IV SOLR
5.0000 mg/h | INTRAVENOUS | Status: DC
  Administered 2014-11-01 – 2014-11-02 (×2): 5 mg/h via INTRAVENOUS
  Filled 2014-11-01 (×2): qty 100

## 2014-11-01 MED ORDER — METOPROLOL TARTRATE 50 MG PO TABS
50.0000 mg | ORAL_TABLET | Freq: Two times a day (BID) | ORAL | Status: DC
  Administered 2014-11-01: 50 mg via ORAL
  Filled 2014-11-01: qty 1

## 2014-11-01 MED ORDER — DILTIAZEM HCL 25 MG/5ML IV SOLN
10.0000 mg | Freq: Once | INTRAVENOUS | Status: AC
  Administered 2014-11-01: 10 mg via INTRAVENOUS
  Filled 2014-11-01: qty 5

## 2014-11-01 MED ORDER — INSULIN ASPART 100 UNIT/ML ~~LOC~~ SOLN
0.0000 [IU] | Freq: Every day | SUBCUTANEOUS | Status: DC
  Administered 2014-11-03: 2 [IU] via SUBCUTANEOUS
  Administered 2014-11-04: 3 [IU] via SUBCUTANEOUS

## 2014-11-01 MED ORDER — INSULIN ASPART 100 UNIT/ML ~~LOC~~ SOLN
3.0000 [IU] | Freq: Three times a day (TID) | SUBCUTANEOUS | Status: DC
  Administered 2014-11-01 – 2014-11-02 (×2): 3 [IU] via SUBCUTANEOUS

## 2014-11-01 MED ORDER — METOPROLOL TARTRATE 1 MG/ML IV SOLN: 5.0000 mg | Freq: Four times a day (QID) | INTRAVENOUS | Status: DC | PRN

## 2014-11-01 MED ORDER — HEPARIN (PORCINE) IN NACL 100-0.45 UNIT/ML-% IJ SOLN
1300.0000 [IU]/h | INTRAMUSCULAR | Status: DC
  Administered 2014-11-01: 900 [IU]/h via INTRAVENOUS
  Administered 2014-11-02 – 2014-11-04 (×4): 1300 [IU]/h via INTRAVENOUS
  Filled 2014-11-01 (×5): qty 250

## 2014-11-01 MED ORDER — ENSURE ENLIVE PO LIQD
237.0000 mL | ORAL | Status: DC
  Administered 2014-11-03 – 2014-11-04 (×2): 237 mL via ORAL

## 2014-11-01 MED ORDER — METOPROLOL TARTRATE 1 MG/ML IV SOLN
5.0000 mg | Freq: Three times a day (TID) | INTRAVENOUS | Status: DC | PRN
  Administered 2014-11-01 (×2): 5 mg via INTRAVENOUS
  Filled 2014-11-01 (×2): qty 5

## 2014-11-01 MED ORDER — INSULIN DETEMIR 100 UNIT/ML ~~LOC~~ SOLN
30.0000 [IU] | Freq: Every day | SUBCUTANEOUS | Status: DC
  Filled 2014-11-01: qty 0.3

## 2014-11-01 MED ORDER — POTASSIUM CHLORIDE 10 MEQ/100ML IV SOLN: 10.0000 meq | INTRAVENOUS | Status: DC

## 2014-11-01 MED ORDER — ASPIRIN EC 325 MG PO TBEC
325.0000 mg | DELAYED_RELEASE_TABLET | Freq: Every day | ORAL | Status: DC
  Administered 2014-11-01 – 2014-11-04 (×4): 325 mg via ORAL
  Filled 2014-11-01 (×4): qty 1

## 2014-11-01 MED ORDER — SODIUM CHLORIDE 0.9 % IV SOLN: INTRAVENOUS | Status: DC

## 2014-11-01 MED ORDER — AMLODIPINE BESYLATE 10 MG PO TABS
10.0000 mg | ORAL_TABLET | Freq: Every day | ORAL | Status: DC
  Administered 2014-11-01 – 2014-11-05 (×5): 10 mg via ORAL
  Filled 2014-11-01 (×5): qty 1

## 2014-11-01 MED ORDER — INSULIN ASPART 100 UNIT/ML ~~LOC~~ SOLN
0.0000 [IU] | Freq: Three times a day (TID) | SUBCUTANEOUS | Status: DC
  Administered 2014-11-01: 3 [IU] via SUBCUTANEOUS
  Administered 2014-11-02: 5 [IU] via SUBCUTANEOUS
  Administered 2014-11-02: 3 [IU] via SUBCUTANEOUS
  Administered 2014-11-02: 8 [IU] via SUBCUTANEOUS
  Administered 2014-11-03: 5 [IU] via SUBCUTANEOUS
  Administered 2014-11-03: 11 [IU] via SUBCUTANEOUS
  Administered 2014-11-04 – 2014-11-05 (×5): 3 [IU] via SUBCUTANEOUS

## 2014-11-01 MED ORDER — POTASSIUM CHLORIDE 10 MEQ/100ML IV SOLN
10.0000 meq | INTRAVENOUS | Status: AC
  Administered 2014-11-01 (×2): 10 meq via INTRAVENOUS
  Filled 2014-11-01 (×2): qty 100

## 2014-11-01 MED ORDER — SODIUM CHLORIDE 0.9 % IV SOLN
INTRAVENOUS | Status: DC | PRN
  Administered 2014-11-01: 07:00:00 via INTRAVENOUS

## 2014-11-01 MED ORDER — METOPROLOL TARTRATE 25 MG PO TABS
25.0000 mg | ORAL_TABLET | Freq: Two times a day (BID) | ORAL | Status: DC
  Administered 2014-11-01: 25 mg via ORAL
  Filled 2014-11-01: qty 1

## 2014-11-01 NOTE — Progress Notes (Signed)
PT Cancellation Note  Patient Details Name: John Romero MRN: [id number] DOB: 1930-12-17   Cancelled Treatment:    Reason Eval/Treat Not Completed: Patient at procedure or test/unavailable (Pt at Echo.  Will return tomorrow.  Thanks.)   Irwin Brakeman F 11/01/2014, 4:07 PM  Torianna Junio,PT Acute Rehabilitation [phone] (pager)

## 2014-11-01 NOTE — Progress Notes (Signed)
[facility]    Date of Admission:  10/30/2014   Total days of antibiotics 2           ID: John Romero is a 81 y.o. male with   Principal Problem:   Acute encephalopathy Active Problems:   Chronic kidney disease, stage 3   Gout, chronic   Encephalopathy acute   Hypertensive urgency   DM (diabetes mellitus), type 2 with renal complications ([facility])   Secondary hypertension, unspecified    Subjective: This morning, alert, answering questions slowly. A xo by2  Medications:  . amLODipine  10 mg Oral Daily  . antiseptic oral rinse  7 mL Mouth Rinse BID  . aspirin EC  325 mg Oral Daily  . chlorhexidine  15 mL Mouth Rinse BID  . cloNIDine  0.3 mg Transdermal Weekly  . insulin aspart  0-9 Units Subcutaneous Q4H  . insulin detemir  25 Units Subcutaneous Daily  . metoprolol tartrate  25 mg Oral BID  . potassium chloride  10 mEq Intravenous Q1 Hr x 2    Objective: Vital signs in last 24 hours: Temp:  [99.1 F (37.3 C)-101.5 F (38.6 C)] 99.1 F (37.3 C) (10/14 0400) Pulse Rate:  [87-126] 87 (10/14 0600) Resp:  [13-33] 18 (10/14 0600) BP: (142-211)/(65-101) 142/65 mmHg (10/14 0600) SpO2:  [92 %-98 %] 94 % (10/14 0600) FiO2 (%):  [2 %] 2 % (10/13 2000) Physical Exam  Constitutional: He is oriented to person, and where he lives. He appears well-developed and well-nourished. No distress.  HENT: EOMI Mouth/Throat: Oropharynx is clear and moist. No oropharyngeal exudate.  Cardiovascular: Normal rate, regular rhythm and normal heart sounds. Exam reveals no gallop and no friction rub.  No murmur heard.  Pulmonary/Chest: Effort normal and breath sounds normal. No respiratory distress. He has no wheezes.  Abdominal: Soft. Bowel sounds are normal. He exhibits no distension. There is no tenderness.  Lymphadenopathy:  He has no cervical adenopathy.  Neurological: He is alert and oriented to person,and where he lives. Moves all extremities.  Skin: Skin is  warm and dry. No rash noted. No erythema.  Psychiatric: He has a normal mood and affect. His behavior is normal.     Lab Results  Recent Labs  10/31/14 0429 11/01/14 0311  WBC 11.4* 15.4*  HGB 15.8 14.8  HCT 46.6 44.2  NA 135 142  K 3.2* 3.0*  CL 95* 105  CO2 22 25  BUN 13 18  CREATININE 1.45* 1.23   Liver Panel  Recent Labs  10/31/14 0429 11/01/14 0311  PROT 7.0 6.4*  ALBUMIN 4.0 3.5  AST 58* 54*  ALT 31 26  ALKPHOS 62 46  BILITOT 1.8* 1.1    Microbiology: Csf cx NGTD Studies/Results: Dg Chest 2 View  10/30/2014  CLINICAL DATA:  Altered mental status EXAM: CHEST  2 VIEW COMPARISON:  February 03, 2006 FINDINGS: There is no edema or consolidation. Heart is upper normal in size with pulmonary vascularity within normal limits. Patient is status post coronary artery bypass grafting. There is atherosclerotic change in the aorta. No adenopathy. No pneumothorax. No bone lesions. IMPRESSION: No edema or consolidation. Electronically Signed   By: Lowella Grip III M.D.   On: 10/30/2014 19:50   Ct Head Wo Contrast  10/30/2014  CLINICAL DATA:  81 year old male with altered mental status, found down. EXAM: CT HEAD WITHOUT CONTRAST CT CERVICAL SPINE WITHOUT CONTRAST TECHNIQUE: Multidetector CT imaging of the head and cervical spine was  performed following the standard protocol without intravenous contrast. Multiplanar CT image reconstructions of the cervical spine were also generated. COMPARISON:  None. FINDINGS: CT HEAD FINDINGS Atrophy, chronic small-vessel white matter ischemic changes and small remote bilateral cerebellar infarcts noted. No acute intracranial abnormalities are identified, including mass lesion or mass effect, hydrocephalus, extra-axial fluid collection, midline shift, hemorrhage, or acute infarction. The visualized bony calvarium is unremarkable. CT CERVICAL SPINE FINDINGS Normal alignment noted. There is no evidence of acute fracture, subluxation or  prevertebral soft tissue swelling. Moderate to severe degenerative disc disease and spondylosis from C2-C6 identified contributing to moderate central spinal and bony foraminal narrowing. No focal bony lesions are identified. The soft tissue structures and lung apices are unremarkable. IMPRESSION: No evidence of acute intracranial abnormality. Atrophy, chronic small-vessel white matter ischemic changes and small remote bilateral cerebellar infarcts. No static evidence of acute injury to the cervical spine. Moderate to severe degenerative changes from C2-C6 contributing to moderate central spinal and foraminal narrowing. Electronically Signed   By: Margarette Canada M.D.   On: 10/30/2014 19:52   Ct Cervical Spine Wo Contrast  10/30/2014  CLINICAL DATA:  81 year old male with altered mental status, found down. EXAM: CT HEAD WITHOUT CONTRAST CT CERVICAL SPINE WITHOUT CONTRAST TECHNIQUE: Multidetector CT imaging of the head and cervical spine was performed following the standard protocol without intravenous contrast. Multiplanar CT image reconstructions of the cervical spine were also generated. COMPARISON:  None. FINDINGS: CT HEAD FINDINGS Atrophy, chronic small-vessel white matter ischemic changes and small remote bilateral cerebellar infarcts noted. No acute intracranial abnormalities are identified, including mass lesion or mass effect, hydrocephalus, extra-axial fluid collection, midline shift, hemorrhage, or acute infarction. The visualized bony calvarium is unremarkable. CT CERVICAL SPINE FINDINGS Normal alignment noted. There is no evidence of acute fracture, subluxation or prevertebral soft tissue swelling. Moderate to severe degenerative disc disease and spondylosis from C2-C6 identified contributing to moderate central spinal and bony foraminal narrowing. No focal bony lesions are identified. The soft tissue structures and lung apices are unremarkable. IMPRESSION: No evidence of acute intracranial abnormality.  Atrophy, chronic small-vessel white matter ischemic changes and small remote bilateral cerebellar infarcts. No static evidence of acute injury to the cervical spine. Moderate to severe degenerative changes from C2-C6 contributing to moderate central spinal and foraminal narrowing. Electronically Signed   By: Margarette Canada M.D.   On: 10/30/2014 19:52   Mr Brain Wo Contrast  10/30/2014  CLINICAL DATA:  Found down on kitchen floor, last seen normal at noon today. Aphasic, LEFT-sided gaze. History of hypertension, diabetes, prostate cancer. EXAM: MRI HEAD WITHOUT CONTRAST TECHNIQUE: Multiplanar, multiecho pulse sequences of the brain and surrounding structures were obtained without intravenous contrast. COMPARISON:  CT head October 30, 2014 at 1928 hours FINDINGS: No reduced diffusion to suggest acute ischemia. Punctate foci of susceptibility artifact LEFT cerebellum, RIGHT frontal lobe are nonspecific. O bilateral small cerebellar infarcts. Old RIGHT basal ganglia lacunar infarct. Ventricles are normal for age, mild sulcal effacement at the convexities. Patchy supratentorial white matter T2 hyperintensities without midline shift, mass effect or mass lesions. No abnormal extra-axial fluid collections. Normal major intracranial vascular flow voids seen at the skull base. Status post bilateral ocular lens implants. Small LEFT maxillary mucosal retention cyst without paranasal sinus air-fluid levels. Old LEFT medial orbital blowout fracture. Trace LEFT mastoid effusion. No abnormal sellar expansion. No cerebellar tonsillar ectopia. Patient is edentulous. IMPRESSION: No acute intracranial process. Involutional changes, superimposed component of suspected normal pressure hydrocephalus. Moderate white matter changes can  be seen with chronic small vessel ischemic disease. 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 Electronically Signed   By: Elon Alas M.D.   On: 10/30/2014 22:32      Assessment/Plan: Encephalopathy = appears improving. lp not suggestive of infectious process. Will d/c all antibiotics and antivirals. Will follow up on culture. Suspect he had hypertensive emergency/encephalopathy. Possibly PRES? But wonder if we would have seen MRI changes. He know has Resolution of weakness in right arm and leg. No longer having any gaze preference. Will d/c droplet precaution.  Please see Dr. Benjamine Mola for full details of today's plan   Baxter Flattery [facility] Cell: [phone] Pager: [phone]  11/01/2014, 7:59 AM

## 2014-11-01 NOTE — Progress Notes (Signed)
Subjective: Much improved today.   Objective: Current vital signs: BP 168/99 mmHg  Pulse 88  Temp(Src) 98.7 F (37.1 C) (Oral)  Resp 20  Ht 5' 10.87" (1.8 m)  Wt 94.1 kg (207 lb 7.3 oz)  BMI 29.04 kg/m2  SpO2 97% Vital signs in last 24 hours: Temp:  [98.7 F (37.1 C)-101.5 F (38.6 C)] 98.7 F (37.1 C) (10/14 0821) Pulse Rate:  [87-126] 88 (10/14 0800) Resp:  [13-33] 20 (10/14 0800) BP: (142-211)/(65-101) 168/99 mmHg (10/14 0800) SpO2:  [92 %-98 %] 97 % (10/14 0800) FiO2 (%):  [2 %] 2 % (10/13 2000)  Intake/Output from previous day: 10/13 0701 - 10/14 0700 In: 3904.9 [I.V.:2622.9; IV Piggyback:1282] Out: 2025 [Urine:2025] Intake/Output this shift: Total I/O In: 100 [IV Piggyback:100] Out: -  Nutritional status: Diet clear liquid Room service appropriate?: Yes; Fluid consistency:: Thin  Neurologic Exam: General: NAD Mental Status: Alert, oriented, thought content appropriate.  Speech fluent without evidence of aphasia.  Able to follow 3 step commands without difficulty. Cranial Nerves: II: Visual fields grossly normal, pupils equal, round, reactive to light and accommodation III,IV, VI: ptosis not present, extra-ocular motions intact bilaterally V,VII: smile symmetric, facial light touch sensation normal bilaterally VIII: hearing normal bilaterally IX,X: uvula rises symmetrically XI: bilateral shoulder shrug XII: midline tongue extension without atrophy or fasciculations  Motor: MAEW Sensory: Pinprick and light touch intact throughout, bilaterally    Lab Results: Basic Metabolic Panel:  Recent Labs Lab 10/30/14 1950 10/31/14 0429 11/01/14 0311 11/01/14 0800  NA 138 135 142  --   K 3.2* 3.2* 3.0*  --   CL 98* 95* 105  --   CO2 24 22 25   --   GLUCOSE 254* 364* 199*  --   BUN 15 13 18   --   CREATININE 1.31* 1.45* 1.23  --   CALCIUM 9.3 9.0 9.0  --   MG  --   --   --  2.0    Liver Function Tests:  Recent Labs Lab 10/30/14 1950 10/31/14 0429  11/01/14 0311  AST 42* 58* 54*  ALT 28 31 26   ALKPHOS 68 62 46  BILITOT 1.3* 1.8* 1.1  PROT 7.5 7.0 6.4*  ALBUMIN 4.4 4.0 3.5   No results for input(s): LIPASE, AMYLASE in the last 168 hours.  Recent Labs Lab 10/31/14 0344  AMMONIA 20    CBC:  Recent Labs Lab 10/30/14 1950 10/31/14 0429 11/01/14 0311  WBC 12.6* 11.4* 15.4*  NEUTROABS 11.5* 10.1*  --   HGB 15.7 15.8 14.8  HCT 46.2 46.6 44.2  MCV 90.9 91.9 92.7  PLT 208 207 185    Cardiac Enzymes:  Recent Labs Lab 10/30/14 1950 10/31/14 0344 10/31/14 0753 10/31/14 1226 10/31/14 1227 10/31/14 1830 11/01/14 0311  CKTOTAL 531* 1806*  --   --  1647*  --  1626*  TROPONINI  --  0.32* 0.32* 0.39*  --  0.48*  --     Lipid Panel: No results for input(s): CHOL, TRIG, HDL, CHOLHDL, VLDL, LDLCALC in the last 168 hours.  CBG:  Recent Labs Lab 10/31/14 1709 10/31/14 2048 10/31/14 2329 11/01/14 0406 11/01/14 0823  GLUCAP 272* 154* 159* 188* 177*    Microbiology: Results for orders placed or performed during the hospital encounter of 10/30/14  Fungus Culture with Smear     Status: None (Preliminary result)   Collection Time: 10/30/14 11:50 PM  Result Value Ref Range Status   Specimen Description CSF  Final   Special Requests NONE  Final   Fungal Smear   Final    NO YEAST OR FUNGAL ELEMENTS SEEN Performed at [facility]    Culture   Final    CULTURE IN PROGRESS FOR FOUR WEEKS Performed at [facility]    Report Status PENDING  Incomplete  CSF culture     Status: None (Preliminary result)   Collection Time: 10/30/14 11:55 PM  Result Value Ref Range Status   Specimen Description CSF  Final   Special Requests NONE  Final   Gram Stain   Final    CYTOSPIN SMEAR WBC PRESENT,BOTH PMN AND MONONUCLEAR NO ORGANISMS SEEN    Culture PENDING  Incomplete   Report Status PENDING  Incomplete  MRSA PCR Screening     Status: None   Collection Time: 10/31/14  8:05 PM  Result Value Ref Range  Status   MRSA by PCR NEGATIVE NEGATIVE Final    Comment:        The GeneXpert MRSA Assay (FDA approved for NASAL specimens only), is one component of a comprehensive MRSA colonization surveillance program. It is not intended to diagnose MRSA infection nor to guide or monitor treatment for MRSA infections.     Coagulation Studies:  Recent Labs  10/30/14 1950 10/31/14 0429  LABPROT 13.8 14.3  INR 1.04 1.09    Imaging: Dg Chest 2 View  10/30/2014  CLINICAL DATA:  Altered mental status EXAM: CHEST  2 VIEW COMPARISON:  February 03, 2006 FINDINGS: There is no edema or consolidation. Heart is upper normal in size with pulmonary vascularity within normal limits. Patient is status post coronary artery bypass grafting. There is atherosclerotic change in the aorta. No adenopathy. No pneumothorax. No bone lesions. IMPRESSION: No edema or consolidation. Electronically Signed   By: Lowella Grip III M.D.   On: 10/30/2014 19:50   Ct Head Wo Contrast  10/30/2014  CLINICAL DATA:  81 year old male with altered mental status, found down. EXAM: CT HEAD WITHOUT CONTRAST CT CERVICAL SPINE WITHOUT CONTRAST TECHNIQUE: Multidetector CT imaging of the head and cervical spine was performed following the standard protocol without intravenous contrast. Multiplanar CT image reconstructions of the cervical spine were also generated. COMPARISON:  None. FINDINGS: CT HEAD FINDINGS Atrophy, chronic small-vessel white matter ischemic changes and small remote bilateral cerebellar infarcts noted. No acute intracranial abnormalities are identified, including mass lesion or mass effect, hydrocephalus, extra-axial fluid collection, midline shift, hemorrhage, or acute infarction. The visualized bony calvarium is unremarkable. CT CERVICAL SPINE FINDINGS Normal alignment noted. There is no evidence of acute fracture, subluxation or prevertebral soft tissue swelling. Moderate to severe degenerative disc disease and  spondylosis from C2-C6 identified contributing to moderate central spinal and bony foraminal narrowing. No focal bony lesions are identified. The soft tissue structures and lung apices are unremarkable. IMPRESSION: No evidence of acute intracranial abnormality. Atrophy, chronic small-vessel white matter ischemic changes and small remote bilateral cerebellar infarcts. No static evidence of acute injury to the cervical spine. Moderate to severe degenerative changes from C2-C6 contributing to moderate central spinal and foraminal narrowing. Electronically Signed   By: Margarette Canada M.D.   On: 10/30/2014 19:52   Ct Cervical Spine Wo Contrast  10/30/2014  CLINICAL DATA:  81 year old male with altered mental status, found down. EXAM: CT HEAD WITHOUT CONTRAST CT CERVICAL SPINE WITHOUT CONTRAST TECHNIQUE: Multidetector CT imaging of the head and cervical spine was performed following the standard protocol without intravenous contrast. Multiplanar CT image reconstructions of the cervical spine were also generated. COMPARISON:  None. FINDINGS: CT HEAD FINDINGS Atrophy, chronic small-vessel white matter ischemic changes and small remote bilateral cerebellar infarcts noted. No acute intracranial abnormalities are identified, including mass lesion or mass effect, hydrocephalus, extra-axial fluid collection, midline shift, hemorrhage, or acute infarction. The visualized bony calvarium is unremarkable. CT CERVICAL SPINE FINDINGS Normal alignment noted. There is no evidence of acute fracture, subluxation or prevertebral soft tissue swelling. Moderate to severe degenerative disc disease and spondylosis from C2-C6 identified contributing to moderate central spinal and bony foraminal narrowing. No focal bony lesions are identified. The soft tissue structures and lung apices are unremarkable. IMPRESSION: No evidence of acute intracranial abnormality. Atrophy, chronic small-vessel white matter ischemic changes and small remote  bilateral cerebellar infarcts. No static evidence of acute injury to the cervical spine. Moderate to severe degenerative changes from C2-C6 contributing to moderate central spinal and foraminal narrowing. Electronically Signed   By: Margarette Canada M.D.   On: 10/30/2014 19:52   Mr Brain Wo Contrast  10/30/2014  CLINICAL DATA:  Found down on kitchen floor, last seen normal at noon today. Aphasic, LEFT-sided gaze. History of hypertension, diabetes, prostate cancer. EXAM: MRI HEAD WITHOUT CONTRAST TECHNIQUE: Multiplanar, multiecho pulse sequences of the brain and surrounding structures were obtained without intravenous contrast. COMPARISON:  CT head October 30, 2014 at 1928 hours FINDINGS: No reduced diffusion to suggest acute ischemia. Punctate foci of susceptibility artifact LEFT cerebellum, RIGHT frontal lobe are nonspecific. O bilateral small cerebellar infarcts. Old RIGHT basal ganglia lacunar infarct. Ventricles are normal for age, mild sulcal effacement at the convexities. Patchy supratentorial white matter T2 hyperintensities without midline shift, mass effect or mass lesions. No abnormal extra-axial fluid collections. Normal major intracranial vascular flow voids seen at the skull base. Status post bilateral ocular lens implants. Small LEFT maxillary mucosal retention cyst without paranasal sinus air-fluid levels. Old LEFT medial orbital blowout fracture. Trace LEFT mastoid effusion. No abnormal sellar expansion. No cerebellar tonsillar ectopia. Patient is edentulous. IMPRESSION: No acute intracranial process. Involutional changes, superimposed component of suspected normal pressure hydrocephalus. Moderate white matter changes can be seen with chronic small vessel ischemic disease. 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 Electronically Signed   By: Elon Alas M.D.   On: 10/30/2014 22:32    Medications:  Scheduled: . amLODipine  10 mg Oral Daily  . antiseptic  oral rinse  7 mL Mouth Rinse BID  . aspirin EC  325 mg Oral Daily  . chlorhexidine  15 mL Mouth Rinse BID  . cloNIDine  0.3 mg Transdermal Weekly  . insulin aspart  0-9 Units Subcutaneous Q4H  . insulin detemir  25 Units Subcutaneous Daily  . metoprolol tartrate  25 mg Oral BID    Assessment/Plan: 81 y.o. male presenting after being found down, mute with right sided weakness and RHH.MRI brain unremarkable. EEG showed no epileptiform activity. Markedly improved today. Cannot rule out PRES versus hypertensive encephalopathy.  At this time neurology will S/O   Etta Quill PA-C [facility] [phone]  11/01/2014, 10:07 AM    Patient seen and evaluated. Agree with above assessment and plan. Mental status and neuro exam  Improved. Suspect symptoms related to hypertensive encephalopathy vs possible PRES. Appreciate ID input. No further neurological workup at this time.   Jim Like, DO [facility] [phone]  If 7pm- 7am, please page neurology on call as listed in [place].'

## 2014-11-01 NOTE — Progress Notes (Signed)
Echocardiogram 2D Echocardiogram has been performed.  John Romero 11/01/2014, 2:50 PM

## 2014-11-01 NOTE — Progress Notes (Signed)
ANTICOAGULATION CONSULT NOTE - Initial Consult  Pharmacy Consult for Heparin Indication: atrial fibrillation  No Known Allergies  Patient Measurements: Height: 5' 10.87" (180 cm) Weight: 207 lb 7.3 oz (94.1 kg) IBW/kg (Calculated) : 74.99   Vital Signs: Temp: 98.7 F (37.1 C) (10/14 0821) Temp Source: Oral (10/14 0821) BP: 157/97 mmHg (10/14 1049) Pulse Rate: 90 (10/14 1049)  Labs:  Recent Labs  10/30/14 1950  10/31/14 0344 10/31/14 0429 10/31/14 0753 10/31/14 1226 10/31/14 1227 10/31/14 1830 11/01/14 0311  HGB 15.7  --   --  15.8  --   --   --   --  14.8  HCT 46.2  --   --  46.6  --   --   --   --  44.2  PLT 208  --   --  207  --   --   --   --  185  APTT 26  --   --  28  --   --   --   --   --   LABPROT 13.8  --   --  14.3  --   --   --   --   --   INR 1.04  --   --  1.09  --   --   --   --   --   CREATININE 1.31*  --   --  1.45*  --   --   --   --  1.23  CKTOTAL 531*  --  1806*  --   --   --  1647*  --  1626*  TROPONINI  --   < > 0.32*  --  0.32* 0.39*  --  0.48*  --   < > = values in this interval not displayed.  Estimated Creatinine Clearance: 54.1 mL/min (by C-G formula based on Cr of 1.23).   Medical History: Past Medical History  Diagnosis Date  . Type II or unspecified type diabetes mellitus without mention of complication, uncontrolled   . Other and unspecified hyperlipidemia   . Gout, unspecified   . Unspecified essential hypertension   . Type II or unspecified type diabetes mellitus with renal manifestations, uncontrolled   . 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 Gout, unspecified   . Type II or unspecified type diabetes mellitus without mention of complication, uncontrolled   . First degree atrioventricular block   . Malignant neoplasm of prostate ([place])   . Malignant neoplasm of prostate ([place])   . Type II or unspecified type diabetes mellitus without mention of complication, not stated as uncontrolled   . Other and  unspecified hyperlipidemia   . Unspecified tinnitus   . Unspecified essential hypertension   . Old myocardial infarction   . Coronary atherosclerosis of unspecified type of vessel, native or graft   . Edema   . Undiagnosed cardiac murmurs   . Undiagnosed cardiac murmurs       Assessment: 84yom admitted with MS changes improved today and MRI neg.  New Afib CVR on metoprolol.  Start heparin drip.  If brige to warfarin may want to consider dec ASA to 81mg .  Goal of Therapy:  Heparin level 0.3-0.7 units/ml Monitor platelets by anticoagulation protocol: Yes   Plan:  Heparin drip 900 uts/hr HL in 6hr Daily HL, CBC   Bonnita Nasuti Pharm.D. CPP, BCPS Clinical Pharmacist [phone] 11/01/2014 11:16 AM

## 2014-11-01 NOTE — Progress Notes (Signed)
ANTICOAGULATION CONSULT NOTE   Pharmacy Consult for Heparin Indication: atrial fibrillation  No Known Allergies  Patient Measurements: Height: 5' 10.87" (180 cm) Weight: 207 lb 7.3 oz (94.1 kg) IBW/kg (Calculated) : 74.99   Vital Signs: Temp: 98.7 F (37.1 C) (10/14 1137) Temp Source: Oral (10/14 1137) BP: 111/43 mmHg (10/14 1915) Pulse Rate: 69 (10/14 1915)  Labs:  Recent Labs  10/30/14 1950  10/31/14 0344 10/31/14 0429 10/31/14 0753 10/31/14 1226 10/31/14 1227 10/31/14 1830 11/01/14 0311 11/01/14 1837  HGB 15.7  --   --  15.8  --   --   --   --  14.8  --   HCT 46.2  --   --  46.6  --   --   --   --  44.2  --   PLT 208  --   --  207  --   --   --   --  185  --   APTT 26  --   --  28  --   --   --   --   --   --   LABPROT 13.8  --   --  14.3  --   --   --   --   --   --   INR 1.04  --   --  1.09  --   --   --   --   --   --   HEPARINUNFRC  --   --   --   --   --   --   --   --   --  0.10*  CREATININE 1.31*  --   --  1.45*  --   --   --   --  1.23  --   CKTOTAL 531*  --  1806*  --   --   --  1647*  --  1626*  --   TROPONINI  --   < > 0.32*  --  0.32* 0.39*  --  0.48*  --   --   < > = values in this interval not displayed.  Estimated Creatinine Clearance: 54.1 mL/min (by C-G formula based on Cr of 1.23).   Medical History: Past Medical History  Diagnosis Date  . Type II or unspecified type diabetes mellitus without mention of complication, uncontrolled   . Other and unspecified hyperlipidemia   . Gout, unspecified   . Unspecified essential hypertension   . Type II or unspecified type diabetes mellitus with renal manifestations, uncontrolled   . 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 Gout, unspecified   . Type II or unspecified type diabetes mellitus without mention of complication, uncontrolled   . First degree atrioventricular block   . Malignant neoplasm of prostate ([place])   . Malignant neoplasm of prostate ([place])   . Type II or  unspecified type diabetes mellitus without mention of complication, not stated as uncontrolled   . Other and unspecified hyperlipidemia   . Unspecified tinnitus   . Unspecified essential hypertension   . Old myocardial infarction   . Coronary atherosclerosis of unspecified type of vessel, native or graft   . Edema   . Undiagnosed cardiac murmurs   . Undiagnosed cardiac murmurs     Assessment: 84yom admitted with MS changes improved today and MRI neg. New Afib CVR on metoprolol. Started on heparin drip earlier today. Initial level is low at 0.1. No bleeding issues noted. Will adjust accordingly.   If  brige to warfarin may want to consider dec ASA to 81mg .  Goal of Therapy:  Heparin level 0.3-0.7 units/ml Monitor platelets by anticoagulation protocol: Yes   Plan:  Heparin drip 1150/hr HL in 8r Daily HL, CBC  Erin Hearing PharmD., BCPS Clinical Pharmacist Pager [phone] 11/01/2014 7:39 PM

## 2014-11-01 NOTE — Progress Notes (Signed)
[facility] for Infectious Disease    Date of Admission:  10/30/2014           Total days of antibiotics 2 Day 10/12-10/13 ampicillin, ceftriaxone, vancomycin, ricfampin, acyclovir   ID: John Romero is a 81 y.o. male with history of T2DM, prostate CA s/p resection, ischemic cardiomyopathy/MI/CABG found lying after a fall next to his refrigerator at home and nonresponsive. Of note he was reported to have left lateral gaze deviation and no movement of right extremities. He presented to ED with SBP in 220s and HR 110s, and fever up to 101.6. CT and MRI of head/neck demonstrated numerous old areas of infarct but no acute process. LP obtained showing glucose 139, protein 76, 1 WBC, few neuts/lymphs/monos. EEG obtained consistent with asymmetric activity but no epileptiform pattern. Patient was started empirically on ampicillin, ceftriaxone, vancomycin, acyclovir for possible bacterial or viral meningitis. However rapidly improved with resolution of fever and level of consciousness improved.  Principal Problem:   Acute encephalopathy Active Problems:   Chronic kidney disease, stage 3   Gout, chronic   Encephalopathy acute   Hypertensive urgency   DM (diabetes mellitus), type 2 with renal complications (HCC)   Secondary hypertension, unspecified   Elevated troponin   PAF (paroxysmal atrial fibrillation) (HCC)    Subjective: Patient much improved today, he is verbal and answers some questions but still not oriented. Right side movement deficits seem to be resolved now as well. His fevers ended overnight and have temperatures continued to be normal throughout the day. Developed afib with RVR overnight and repeat EKG showing ST depression that is new.  Medications:  . amLODipine  10 mg Oral Daily  . antiseptic oral rinse  7 mL Mouth Rinse BID  . aspirin EC  325 mg Oral Daily  . chlorhexidine  15 mL Mouth  Rinse BID  . cloNIDine  0.3 mg Transdermal Weekly  . feeding supplement (ENSURE ENLIVE)  237 mL Oral Q24H  . insulin aspart  0-15 Units Subcutaneous TID WC  . insulin aspart  0-5 Units Subcutaneous QHS  . insulin aspart  3 Units Subcutaneous TID WC  . [START ON 11/02/2014] insulin detemir  30 Units Subcutaneous Daily  . metoprolol tartrate  50 mg Oral BID    Objective: Vital signs in last 24 hours: Temp:  [98.7 F (37.1 C)-100.8 F (38.2 C)] 98.7 F (37.1 C) (10/14 1137) Pulse Rate:  [60-126] 69 (10/14 1915) Resp:  [16-33] 18 (10/14 1915) BP: (111-202)/(43-110) 111/43 mmHg (10/14 1915) SpO2:  [91 %-98 %] 94 % (10/14 1915) FiO2 (%):  [2 %] 2 % (10/13 2000)   GENERAL- Woken readily to voice, oriented to person and place HEENT- Atraumatic, PERRL, moist oral mucosa CARDIAC- Irregularly irregular, mid systolic murmur audible over LUSB RESP- CTAB, no wheezes or crackles. ABDOMEN- Soft, nontender, no guarding or rebound, normoactive bowel sounds present NEURO-  moves both extremities bilaterally with 5/5 strength, no lateral gaze preference EXTREMITIES- symmetric, 2+ pedal edema below knee bilaterally SKIN- Warm, dry, No rash or lesion.  Lab Results  Recent Labs  10/31/14 0429 11/01/14 0311  WBC 11.4* 15.4*  HGB 15.8 14.8  HCT 46.6 44.2  NA 135 142  K 3.2* 3.0*  CL 95* 105  CO2 22 25  BUN 13 18  CREATININE 1.45* 1.23   Liver Panel  Recent Labs  10/31/14 0429 11/01/14 0311  PROT 7.0 6.4*  ALBUMIN 4.0 3.5  AST 58* 54*  ALT 31 26  ALKPHOS  62 46  BILITOT 1.8* 1.1   Sedimentation Rate No results for input(s): ESRSEDRATE in the last 72 hours. C-Reactive Protein No results for input(s): CRP in the last 72 hours.  Microbiology:  Studies/Results: Dg Chest 2 View  10/30/2014  CLINICAL DATA:  Altered mental status EXAM: CHEST  2 VIEW COMPARISON:  February 03, 2006 FINDINGS: There is no edema or consolidation. Heart is upper normal in size with pulmonary vascularity  within normal limits. Patient is status post coronary artery bypass grafting. There is atherosclerotic change in the aorta. No adenopathy. No pneumothorax. No bone lesions. IMPRESSION: No edema or consolidation. Electronically Signed   By: Lowella Grip III M.D.   On: 10/30/2014 19:50   Mr Brain Wo Contrast  10/30/2014  CLINICAL DATA:  Found down on kitchen floor, last seen normal at noon today. Aphasic, LEFT-sided gaze. History of hypertension, diabetes, prostate cancer. EXAM: MRI HEAD WITHOUT CONTRAST TECHNIQUE: Multiplanar, multiecho pulse sequences of the brain and surrounding structures were obtained without intravenous contrast. COMPARISON:  CT head October 30, 2014 at 1928 hours FINDINGS: No reduced diffusion to suggest acute ischemia. Punctate foci of susceptibility artifact LEFT cerebellum, RIGHT frontal lobe are nonspecific. O bilateral small cerebellar infarcts. Old RIGHT basal ganglia lacunar infarct. Ventricles are normal for age, mild sulcal effacement at the convexities. Patchy supratentorial white matter T2 hyperintensities without midline shift, mass effect or mass lesions. No abnormal extra-axial fluid collections. Normal major intracranial vascular flow voids seen at the skull base. Status post bilateral ocular lens implants. Small LEFT maxillary mucosal retention cyst without paranasal sinus air-fluid levels. Old LEFT medial orbital blowout fracture. Trace LEFT mastoid effusion. No abnormal sellar expansion. No cerebellar tonsillar ectopia. Patient is edentulous. IMPRESSION: No acute intracranial process. Involutional changes, superimposed component of suspected normal pressure hydrocephalus. Moderate white matter changes can be seen with chronic small vessel ischemic disease. 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 Electronically Signed   By: Elon Alas M.D.   On: 10/30/2014 22:32     Assessment/Plan: Acute Encephalitis Patient rapidly  improved in mental status both in focal deficit of right extremities as well as responsiveness, although was still not oriented on examination. Procalcitonin of 0.10, although test is less validated in nonpulmonary infectious workup. Leukocytosis is increased from 11.4 to 15.4, but fever is resolved. Also troponins trended slightly up to 0.48 and with ongoing afib with RVR. At this point it is seeming more likely related to possible malignant hypertension or other vascular event.  -Does not need droplet contact precaution for bacterial meningitis -Discontinued antimicrobial treatments -Can continued to follow up blood culture but may be low yield -Also following up HSV, West Nile Ag, enterovirus PCR -If new data presents, condition reverses, or new high fevers redevelop infectious risk can be reassessed   Yetta Glassman Internal Medicine Resident PGY-I Pager: [phone]  11/01/2014, 7:40 PM

## 2014-11-01 NOTE — Evaluation (Signed)
Clinical/Bedside Swallow Evaluation Patient Details  Name: John Romero MRN: [id number] Date of Birth: 1931/01/05  Today's Date: 11/01/2014 Time: SLP Start Time (ACUTE ONLY): 0951 SLP Stop Time (ACUTE ONLY): 1006 SLP Time Calculation (min) (ACUTE ONLY): 15 min  Past Medical History:  Past Medical History  Diagnosis Date  . Type II or unspecified type diabetes mellitus without mention of complication, uncontrolled   . Other and unspecified hyperlipidemia   . Gout, unspecified   . Unspecified essential hypertension   . Type II or unspecified type diabetes mellitus with renal manifestations, uncontrolled   . 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 Gout, unspecified   . Type II or unspecified type diabetes mellitus without mention of complication, uncontrolled   . First degree atrioventricular block   . Malignant neoplasm of prostate ([place])   . Malignant neoplasm of prostate ([place])   . Type II or unspecified type diabetes mellitus without mention of complication, not stated as uncontrolled   . Other and unspecified hyperlipidemia   . Unspecified tinnitus   . Unspecified essential hypertension   . Old myocardial infarction   . Coronary atherosclerosis of unspecified type of vessel, native or graft   . Edema   . Undiagnosed cardiac murmurs   . Undiagnosed cardiac murmurs    Past Surgical History:  Past Surgical History  Procedure Laterality Date  . Appendectomy    . Prostate surgery    . Coronary artery bypass graft    . Eye surgery  2014   HPI:  81 y.o. male with history of CAD status post CABG, diabetes mellitus type 2, MI, hypertension, chronic kidney disease, hyperlipidemia and gout admitted with confusion, not moving right side and left-sided gage preference. MRI No acute intracranial process. 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 MD suspect he had hypertensive emergency/encephalopathy. CXR no edema or consolidation.    Assessment / Plan / Recommendation Clinical Impression  Pt awake, groggy and speaking at short phrase level. No s/s aspiration with puree or thin; swallow initiation appeared timely using cup and straw. Aspiration risk decreases as alertness increases. Daughter stated she will bring dentures today. Recommend Dys 2, thin, straws allowed, pills whole in applesauce and full supervision. ST will follow for safety and upgrade.     Aspiration Risk  Moderate    Diet Recommendation Dysphagia 2 (Fine chop);Thin   Medication Administration: Whole meds with puree Compensations: Slow rate;Small sips/bites;Check for pocketing    Other  Recommendations Oral Care Recommendations: Oral care BID   Follow Up Recommendations       Frequency and Duration min 2x/week  2 weeks   Pertinent Vitals/Pain none    SLP Swallow Goals     Swallow Study Prior Functional Status       General Other Pertinent Information: 81 y.o. male with history of CAD status post CABG, diabetes mellitus type 2, MI, hypertension, chronic kidney disease, hyperlipidemia and gout admitted with confusion, not moving right side and left-sided gage preference. MRI No acute intracranial process. 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 MD suspect he had hypertensive emergency/encephalopathy. CXR no edema or consolidation. Type of Study: Bedside swallow evaluation Previous Swallow Assessment:  (none) Diet Prior to this Study: Thin liquids (clear) Temperature Spikes Noted: No Respiratory Status: Supplemental O2 delivered via (comment) History of Recent Intubation: No Behavior/Cognition: Cooperative;Lethargic/Drowsy;Requires cueing Oral Cavity - Dentition:  (dentures at home, dtr bringing today) Self-Feeding Abilities: Needs assist;Needs set up Patient Positioning: Upright  in bed Baseline Vocal Quality: Low vocal intensity Volitional Cough: Cognitively unable to elicit Volitional Swallow: Able to elicit     Oral/Motor/Sensory Function Overall Oral Motor/Sensory Function:  (not follow commands, no focal weakness)   Ice Chips Ice chips: Impaired Oral Phase Functional Implications: Oral holding Pharyngeal Phase Impairments:  (none)   Thin Liquid Thin Liquid: Within functional limits    Nectar Thick Nectar Thick Liquid: Not tested   Honey Thick Honey Thick Liquid: Not tested   Puree Puree: Within functional limits   Solid   GO    Solid:  (NT, dentures not present)       Mick Sell, [name] 11/01/2014,10:20 AM  [name] Colvin Caroli.Ed [employer] [phone]

## 2014-11-01 NOTE — Consult Note (Signed)
Reason for Consult: atrial fib   Referring Physician:  Dr. Tana Coast  PCP:  [name], DO  ? Dr. Felipa Eth   Primary Cardiologist:new  John Romero is an 81 y.o. male.    Chief Complaint:  Pt admitted 10/31/14 for AMS.    HPI:  81 year old femal with hx of CAD s/p CABG ? , DM-2, CKD, hyperlipidemia.  Yesterday found not moving his rt side and confused.  MRI without stroke, neurology saw and did LP and pt placed on acyclovir and Keppra.  Pt not following commands on admit per H&P.      EKG initial 10/30/14  with possible atrial tach  Sinus or ectopic atrial tachycardia Left ventricular hypertrophy Posterior infarct, acute (LCx) Prolonged QT interval No piror EKG for comparison ST depression in V2-V3  Today EKG  Atrial fibrillation with rapid ventricular response Marked ST abnormality, possible anterior subendocardial injury Abnormal ECG  Troponins: 0.32-->0.39--> 0.48, CK elevated at 1625   ID and neurology has also evaluated, his encephalopathy is improving not infectious process (abx and antivirals stopped - most likely hypertensive emergency/encephalopathy.   Now pt improving but rhythm more chaotic in a fib and more ST depression.  troponins mildly elevated most likely demand ischemia.  Pt denies chest pain.  He is now on IV heparin and he has been given 10 mg IV dilt.   BP initially 221/118 now 157/97.   EEG:  "This EEG is consistent with a focal left hemispheric dysfunction in the setting of a generalized non-specific cerebral dysfunction(encephalopathy). There was no seizure or seizure predisposition recorded on this study."     Past Medical History  Diagnosis Date  . Type II or unspecified type diabetes mellitus without mention of complication, uncontrolled   . Other and unspecified hyperlipidemia   . Gout, unspecified   . Unspecified essential hypertension   . Type II or unspecified type diabetes mellitus with renal manifestations, uncontrolled   .  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 Gout, unspecified   . Type II or unspecified type diabetes mellitus without mention of complication, uncontrolled   . First degree atrioventricular block   . Malignant neoplasm of prostate ([place])   . Malignant neoplasm of prostate ([place])   . Type II or unspecified type diabetes mellitus without mention of complication, not stated as uncontrolled   . Other and unspecified hyperlipidemia   . Unspecified tinnitus   . Unspecified essential hypertension   . Old myocardial infarction   . Coronary atherosclerosis of unspecified type of vessel, native or graft   . Edema   . Undiagnosed cardiac murmurs   . Undiagnosed cardiac murmurs     Past Surgical History  Procedure Laterality Date  . Appendectomy    . Prostate surgery    . Coronary artery bypass graft    . Eye surgery  2014    Family History  Problem Relation Age of Onset  . Diabetes Mother   . Heart disease Father   . Cancer Sister     lung  . Cancer Sister     lung   Social History:  reports that he has never smoked. His smokeless tobacco use includes Chew. He reports that he does not drink alcohol or use illicit drugs.  Allergies: No Known Allergies  OUTPATIENT MEDICATIONS: No current facility-administered medications on file prior to encounter.   Current Outpatient Prescriptions on File Prior to Encounter  Medication Sig  Dispense Refill  . allopurinol (ZYLOPRIM) 300 MG tablet TAKE 1 TABLET DAILY TO PREVENT GOUT ATTACK (Patient taking differently: TAKE 1/2 TABLET DAILY TO PREVENT GOUT ATTACK, take another 1/2 tablet daily if having a gout attack) 90 tablet 1  . aspirin 325 MG EC tablet Take 325 mg by mouth daily.    . cloNIDine (CATAPRES) 0.3 MG tablet Take one tablet by mouth twice daily for blood pressure 60 tablet 0  . insulin aspart (NOVOLOG) 100 UNIT/ML injection Inject 6-10 Units into the skin 3 (three) times daily with meals. 6 unit for blood sugar  150-250 and 10 u for blood sugar 250-350 1 vial 3  . Insulin Detemir (LEVEMIR FLEXPEN) 100 UNIT/ML Pen Inject 38 units once daily to control blood sugar *Please send 90 day supply* 10 pen 3  . metoprolol tartrate (LOPRESSOR) 25 MG tablet Take 1/2 tablet by mouth twice daily to control heart rhythm 90 tablet 3  . Multiple Vitamin (MULTIVITAMIN) tablet Take 1 tablet by mouth daily.    . potassium chloride SA (K-DUR,KLOR-CON) 20 MEQ tablet TAKE 1 TABLET DAILY. FOR LOW POTASSIUM 90 tablet 0  . ramipril (ALTACE) 10 MG capsule Take one capsule by mouth twice daily to control blood pressure 180 capsule 3  . simvastatin (ZOCOR) 20 MG tablet APPOINTMENT OVERDUE, 1 by mouth daily to lower cholesterol 90 tablet 0  . amLODipine (NORVASC) 10 MG tablet TAKE 1 TABLET DAILY FOR BLOOD PRESSURE (Patient not taking: Reported on 10/30/2014) 90 tablet 0  . furosemide (LASIX) 20 MG tablet TAKE 1 TABLET DAILY (Patient not taking: Reported on 10/30/2014) 90 tablet 2  . hydrochlorothiazide (HYDRODIURIL) 50 MG tablet Take one tablet by mouth once daily for blood pressure (Patient not taking: Reported on 10/30/2014) 90 tablet 3  . Insulin Pen Needle (B-D ULTRAFINE III SHORT PEN) 31G X 8 MM MISC Use 2 pen needles daily with the administration of Insulin. DX 250.00 200 each 3  . LEVEMIR FLEXTOUCH 100 UNIT/ML Pen INJECT 35 UNITS ONCE DAILY TO CONTROL BLOOD SUGAR (Patient not taking: Reported on 10/30/2014) 150 mL 2  . NOVOLOG FLEXPEN 100 UNIT/ML FlexPen INJECT 6 UNITS INTO THE SKIN THREE TIMES A DAY WITH MEALS (Patient not taking: Reported on 10/30/2014) 15 mL 2   CURRENT MEDICATIONS: Scheduled Meds: . amLODipine  10 mg Oral Daily  . antiseptic oral rinse  7 mL Mouth Rinse BID  . aspirin EC  325 mg Oral Daily  . chlorhexidine  15 mL Mouth Rinse BID  . cloNIDine  0.3 mg Transdermal Weekly  . insulin aspart  0-15 Units Subcutaneous TID WC  . insulin aspart  0-5 Units Subcutaneous QHS  . insulin aspart  3 Units Subcutaneous  TID WC  . [START ON 11/02/2014] insulin detemir  30 Units Subcutaneous Daily  . metoprolol tartrate  25 mg Oral BID   Continuous Infusions: . sodium chloride    . heparin 900 Units/hr (11/01/14 1153)   PRN Meds:.sodium chloride, acetaminophen, hydrALAZINE, labetalol, metoprolol   Results for orders placed or performed during the hospital encounter of 10/30/14 (from the past 48 hour(s))  Urinalysis with microscopic     Status: Abnormal   Collection Time: 10/30/14  7:35 PM  Result Value Ref Range   Color, Urine YELLOW YELLOW   APPearance CLEAR CLEAR   Specific Gravity, Urine 1.014 1.005 - 1.030   pH 7.0 5.0 - 8.0   Glucose, UA >1000 (A) NEGATIVE mg/dL   Hgb urine dipstick LARGE (A) NEGATIVE   Bilirubin   Urine NEGATIVE NEGATIVE   Ketones, ur 40 (A) NEGATIVE mg/dL   Protein, ur 100 (A) NEGATIVE mg/dL   Urobilinogen, UA 1.0 0.0 - 1.0 mg/dL   Nitrite NEGATIVE NEGATIVE   Leukocytes, UA NEGATIVE NEGATIVE   WBC, UA 0-2 <3 WBC/hpf   RBC / HPF 0-2 <3 RBC/hpf   Squamous Epithelial / LPF RARE RARE   Casts HYALINE CASTS (A) NEGATIVE  CBC with Differential     Status: Abnormal   Collection Time: 10/30/14  7:50 PM  Result Value Ref Range   WBC 12.6 (H) 4.0 - 10.5 K/uL   RBC 5.08 4.22 - 5.81 MIL/uL   Hemoglobin 15.7 13.0 - 17.0 g/dL   HCT 46.2 39.0 - 52.0 %   MCV 90.9 78.0 - 100.0 fL   MCH 30.9 26.0 - 34.0 pg   MCHC 34.0 30.0 - 36.0 g/dL   RDW 13.0 11.5 - 15.5 %   Platelets 208 150 - 400 K/uL   Neutrophils Relative % 91 %   Neutro Abs 11.5 (H) 1.7 - 7.7 K/uL   Lymphocytes Relative 6 %   Lymphs Abs 0.8 0.7 - 4.0 K/uL   Monocytes Relative 3 %   Monocytes Absolute 0.3 0.1 - 1.0 K/uL   Eosinophils Relative 0 %   Eosinophils Absolute 0.0 0.0 - 0.7 K/uL   Basophils Relative 0 %   Basophils Absolute 0.0 0.0 - 0.1 K/uL  Comprehensive metabolic panel     Status: Abnormal   Collection Time: 10/30/14  7:50 PM  Result Value Ref Range   Sodium 138 135 - 145 mmol/L   Potassium 3.2 (L) 3.5 -  5.1 mmol/L   Chloride 98 (L) 101 - 111 mmol/L   CO2 24 22 - 32 mmol/L   Glucose, Bld 254 (H) 65 - 99 mg/dL   BUN 15 6 - 20 mg/dL   Creatinine, Ser 1.31 (H) 0.61 - 1.24 mg/dL   Calcium 9.3 8.9 - 10.3 mg/dL   Total Protein 7.5 6.5 - 8.1 g/dL   Albumin 4.4 3.5 - 5.0 g/dL   AST 42 (H) 15 - 41 U/L   ALT 28 17 - 63 U/L   Alkaline Phosphatase 68 38 - 126 U/L   Total Bilirubin 1.3 (H) 0.3 - 1.2 mg/dL   GFR calc non Af Amer 49 (L) >60 mL/min   GFR calc Af Amer 57 (L) >60 mL/min    Comment: (NOTE) The eGFR has been calculated using the CKD EPI equation. This calculation has not been validated in all clinical situations. eGFR's persistently <60 mL/min signify possible Chronic Kidney Disease.    Anion gap 16 (H) 5 - 15  Protime-INR     Status: None   Collection Time: 10/30/14  7:50 PM  Result Value Ref Range   Prothrombin Time 13.8 11.6 - 15.2 seconds   INR 1.04 0.00 - 1.49  APTT     Status: None   Collection Time: 10/30/14  7:50 PM  Result Value Ref Range   aPTT 26 24 - 37 seconds  CK     Status: Abnormal   Collection Time: 10/30/14  7:50 PM  Result Value Ref Range   Total CK 531 (H) 49 - 397 U/L  I-Stat CG4 Lactic Acid, ED     Status: Abnormal   Collection Time: 10/30/14  8:05 PM  Result Value Ref Range   Lactic Acid, Venous 2.15 (HH) 0.5 - 2.0 mmol/L   Comment NOTIFIED PHYSICIAN   POC CBG, ED       Status: Abnormal   Collection Time: 10/30/14  8:22 PM  Result Value Ref Range   Glucose-Capillary 211 (H) 65 - 99 mg/dL  I-Stat Troponin, ED (not at [facility])     Status: None   Collection Time: 10/30/14  8:24 PM  Result Value Ref Range   Troponin i, poc 0.06 0.00 - 0.08 ng/mL   Comment 3            Comment: Due to the release kinetics of cTnI, a negative result within the first hours of the onset of symptoms does not rule out myocardial infarction with certainty. If myocardial infarction is still suspected, repeat the test at appropriate intervals.   Fungus Culture with Smear      Status: None (Preliminary result)   Collection Time: 10/30/14 11:50 PM  Result Value Ref Range   Specimen Description CSF    Special Requests NONE    Fungal Smear      NO YEAST OR FUNGAL ELEMENTS SEEN Performed at [facility]    Culture      CULTURE IN PROGRESS FOR FOUR WEEKS Performed at [facility]    Report Status PENDING   CSF cell count with differential collection tube #: 1     Status: Abnormal   Collection Time: 10/30/14 11:55 PM  Result Value Ref Range   Tube # 1    Color, CSF COLORLESS COLORLESS   Appearance, CSF CLEAR CLEAR   Supernatant NOT INDICATED    RBC Count, CSF 35 (H) 0 /cu mm   WBC, CSF 1 0 - 5 /cu mm   Segmented Neutrophils-CSF TOO FEW TO COUNT, SMEAR AVAILABLE FOR REVIEW 0 - 6 %    Comment: RARE   Lymphs, CSF OCCASIONAL 40 - 80 %   Monocyte-Macrophage-Spinal Fluid FEW 15 - 45 %   Eosinophils, CSF 0 0 - 1 %  CSF cell count with differential     Status: Abnormal   Collection Time: 10/30/14 11:55 PM  Result Value Ref Range   Tube # 4    Color, CSF COLORLESS COLORLESS   Appearance, CSF CLEAR CLEAR   Supernatant NOT INDICATED    RBC Count, CSF 1 (H) 0 /cu mm   WBC, CSF 1 0 - 5 /cu mm   Segmented Neutrophils-CSF TOO FEW TO COUNT, SMEAR AVAILABLE FOR REVIEW 0 - 6 %    Comment: RARE   Lymphs, CSF FEW 40 - 80 %   Monocyte-Macrophage-Spinal Fluid FEW 15 - 45 %   Eosinophils, CSF 0 0 - 1 %  CSF culture     Status: None (Preliminary result)   Collection Time: 10/30/14 11:55 PM  Result Value Ref Range   Specimen Description CSF    Special Requests NONE    Gram Stain      CYTOSPIN SMEAR WBC PRESENT,BOTH PMN AND MONONUCLEAR NO ORGANISMS SEEN    Culture NO GROWTH 1 DAY    Report Status PENDING   Protein and glucose, CSF     Status: Abnormal   Collection Time: 10/30/14 11:55 PM  Result Value Ref Range   Glucose, CSF 139 (H) 40 - 70 mg/dL   Total  Protein, CSF 76 (H) 15 - 45 mg/dL  I-Stat CG4 Lactic Acid, ED     Status: None    Collection Time: 10/31/14 12:14 AM  Result Value Ref Range   Lactic Acid, Venous 1.79 0.5 - 2.0 mmol/L  CBG monitoring, ED     Status: Abnormal   Collection Time:   10/31/14 12:44 AM  Result Value Ref Range   Glucose-Capillary 262 (H) 65 - 99 mg/dL  I-Stat arterial blood gas, ED     Status: Abnormal   Collection Time: 10/31/14 12:47 AM  Result Value Ref Range   pH, Arterial 7.410 7.350 - 7.450   pCO2 arterial 38.8 35.0 - 45.0 mmHg   pO2, Arterial 76.0 (L) 80.0 - 100.0 mmHg   Bicarbonate 24.6 (H) 20.0 - 24.0 mEq/L   TCO2 26 0 - 100 mmol/L   O2 Saturation 95.0 %   Patient temperature 98.6 F    Collection site RADIAL, ALLEN'S TEST ACCEPTABLE    Drawn by Operator    Sample type ARTERIAL   CK     Status: Abnormal   Collection Time: 10/31/14  3:44 AM  Result Value Ref Range   Total CK 1806 (H) 49 - 397 U/L  Ammonia     Status: None   Collection Time: 10/31/14  3:44 AM  Result Value Ref Range   Ammonia 20 9 - 35 umol/L  Troponin I     Status: Abnormal   Collection Time: 10/31/14  3:44 AM  Result Value Ref Range   Troponin I 0.32 (H) <0.031 ng/mL    Comment:        PERSISTENTLY INCREASED TROPONIN VALUES IN THE RANGE OF 0.04-0.49 ng/mL CAN BE SEEN IN:       -UNSTABLE ANGINA       -CONGESTIVE HEART FAILURE       -MYOCARDITIS       -CHEST TRAUMA       -ARRYHTHMIAS       -LATE PRESENTING MYOCARDIAL INFARCTION       -COPD   CLINICAL FOLLOW-UP RECOMMENDED.   CBC with Differential     Status: Abnormal   Collection Time: 10/31/14  4:29 AM  Result Value Ref Range   WBC 11.4 (H) 4.0 - 10.5 K/uL   RBC 5.07 4.22 - 5.81 MIL/uL   Hemoglobin 15.8 13.0 - 17.0 g/dL   HCT 46.6 39.0 - 52.0 %   MCV 91.9 78.0 - 100.0 fL   MCH 31.2 26.0 - 34.0 pg   MCHC 33.9 30.0 - 36.0 g/dL   RDW 13.4 11.5 - 15.5 %   Platelets 207 150 - 400 K/uL   Neutrophils Relative % 88 %   Neutro Abs 10.1 (H) 1.7 - 7.7 K/uL   Lymphocytes Relative 9 %   Lymphs Abs 1.0 0.7 - 4.0 K/uL   Monocytes Relative 3 %    Monocytes Absolute 0.3 0.1 - 1.0 K/uL   Eosinophils Relative 0 %   Eosinophils Absolute 0.0 0.0 - 0.7 K/uL   Basophils Relative 0 %   Basophils Absolute 0.0 0.0 - 0.1 K/uL  Comprehensive metabolic panel     Status: Abnormal   Collection Time: 10/31/14  4:29 AM  Result Value Ref Range   Sodium 135 135 - 145 mmol/L   Potassium 3.2 (L) 3.5 - 5.1 mmol/L   Chloride 95 (L) 101 - 111 mmol/L   CO2 22 22 - 32 mmol/L   Glucose, Bld 364 (H) 65 - 99 mg/dL   BUN 13 6 - 20 mg/dL   Creatinine, Ser 1.45 (H) 0.61 - 1.24 mg/dL   Calcium 9.0 8.9 - 10.3 mg/dL   Total Protein 7.0 6.5 - 8.1 g/dL   Albumin 4.0 3.5 - 5.0 g/dL   AST 58 (H) 15 - 41 U/L   ALT 31 17 - 63 U/L   Alkaline  Phosphatase 62 38 - 126 U/L   Total Bilirubin 1.8 (H) 0.3 - 1.2 mg/dL   GFR calc non Af Amer 43 (L) >60 mL/min   GFR calc Af Amer 50 (L) >60 mL/min    Comment: (NOTE) The eGFR has been calculated using the CKD EPI equation. This calculation has not been validated in all clinical situations. eGFR's persistently <60 mL/min signify possible Chronic Kidney Disease.    Anion gap 18 (H) 5 - 15  Lactic acid, plasma     Status: None   Collection Time: 10/31/14  4:29 AM  Result Value Ref Range   Lactic Acid, Venous 1.9 0.5 - 2.0 mmol/L  Procalcitonin     Status: None   Collection Time: 10/31/14  4:29 AM  Result Value Ref Range   Procalcitonin <0.10 ng/mL    Comment:        Interpretation: PCT (Procalcitonin) <= 0.5 ng/mL: Systemic infection (sepsis) is not likely. Local bacterial infection is possible. (NOTE)         ICU PCT Algorithm               Non ICU PCT Algorithm    ----------------------------     ------------------------------         PCT < 0.25 ng/mL                 PCT < 0.1 ng/mL     Stopping of antibiotics            Stopping of antibiotics       strongly encouraged.               strongly encouraged.    ----------------------------     ------------------------------       PCT level decrease by                PCT < 0.25 ng/mL       >= 80% from peak PCT       OR PCT 0.25 - 0.5 ng/mL          Stopping of antibiotics                                             encouraged.     Stopping of antibiotics           encouraged.    ----------------------------     ------------------------------       PCT level decrease by              PCT >= 0.25 ng/mL       < 80% from peak PCT        AND PCT >= 0.5 ng/mL            Continuin g antibiotics                                              encouraged.       Continuing antibiotics            encouraged.    ----------------------------     ------------------------------     PCT level increase compared          PCT > 0.5 ng/mL         with peak PCT  AND          PCT >= 0.5 ng/mL             Escalation of antibiotics                                          strongly encouraged.      Escalation of antibiotics        strongly encouraged.   Protime-INR     Status: None   Collection Time: 10/31/14  4:29 AM  Result Value Ref Range   Prothrombin Time 14.3 11.6 - 15.2 seconds   INR 1.09 0.00 - 1.49  APTT     Status: None   Collection Time: 10/31/14  4:29 AM  Result Value Ref Range   aPTT 28 24 - 37 seconds  CBG monitoring, ED     Status: Abnormal   Collection Time: 10/31/14  4:44 AM  Result Value Ref Range   Glucose-Capillary 336 (H) 65 - 99 mg/dL  Influenza panel by PCR (type A & B, H1N1)     Status: None   Collection Time: 10/31/14  4:50 AM  Result Value Ref Range   Influenza A By PCR NEGATIVE NEGATIVE   Influenza B By PCR NEGATIVE NEGATIVE   H1N1 flu by pcr NOT DETECTED NOT DETECTED    Comment:        The Xpert Flu assay (FDA approved for nasal aspirates or washes and nasopharyngeal swab specimens), is intended as an aid in the diagnosis of influenza and should not be used as a sole basis for treatment.   Troponin I (q 6hr x 3)     Status: Abnormal   Collection Time: 10/31/14  7:53 AM  Result Value Ref Range   Troponin I 0.32 (H) <0.031 ng/mL     Comment:        PERSISTENTLY INCREASED TROPONIN VALUES IN THE RANGE OF 0.04-0.49 ng/mL CAN BE SEEN IN:       -UNSTABLE ANGINA       -CONGESTIVE HEART FAILURE       -MYOCARDITIS       -CHEST TRAUMA       -ARRYHTHMIAS       -LATE PRESENTING MYOCARDIAL INFARCTION       -COPD   CLINICAL FOLLOW-UP RECOMMENDED.   CBG monitoring, ED     Status: Abnormal   Collection Time: 10/31/14  8:32 AM  Result Value Ref Range   Glucose-Capillary 348 (H) 65 - 99 mg/dL  CBG monitoring, ED     Status: Abnormal   Collection Time: 10/31/14  9:43 AM  Result Value Ref Range   Glucose-Capillary 366 (H) 65 - 99 mg/dL  CBG monitoring, ED     Status: Abnormal   Collection Time: 10/31/14 10:57 AM  Result Value Ref Range   Glucose-Capillary 331 (H) 65 - 99 mg/dL  Troponin I (q 6hr x 3)     Status: Abnormal   Collection Time: 10/31/14 12:26 PM  Result Value Ref Range   Troponin I 0.39 (H) <0.031 ng/mL    Comment:        PERSISTENTLY INCREASED TROPONIN VALUES IN THE RANGE OF 0.04-0.49 ng/mL CAN BE SEEN IN:       -UNSTABLE ANGINA       -CONGESTIVE HEART FAILURE       -MYOCARDITIS       -CHEST TRAUMA       -  ARRYHTHMIAS       -LATE PRESENTING MYOCARDIAL INFARCTION       -COPD   CLINICAL FOLLOW-UP RECOMMENDED.   Vitamin B12     Status: None   Collection Time: 10/31/14 12:26 PM  Result Value Ref Range   Vitamin B-12 418 180 - 914 pg/mL    Comment: (NOTE) This assay is not validated for testing neonatal or myeloproliferative syndrome specimens for Vitamin B12 levels.   TSH     Status: None   Collection Time: 10/31/14 12:26 PM  Result Value Ref Range   TSH 1.004 0.350 - 4.500 uIU/mL  CK     Status: Abnormal   Collection Time: 10/31/14 12:27 PM  Result Value Ref Range   Total CK 1647 (H) 49 - 397 U/L  Glucose, capillary     Status: Abnormal   Collection Time: 10/31/14  5:09 PM  Result Value Ref Range   Glucose-Capillary 272 (H) 65 - 99 mg/dL  Troponin I (q 6hr x 3)     Status: Abnormal    Collection Time: 10/31/14  6:30 PM  Result Value Ref Range   Troponin I 0.48 (H) <0.031 ng/mL    Comment:        PERSISTENTLY INCREASED TROPONIN VALUES IN THE RANGE OF 0.04-0.49 ng/mL CAN BE SEEN IN:       -UNSTABLE ANGINA       -CONGESTIVE HEART FAILURE       -MYOCARDITIS       -CHEST TRAUMA       -ARRYHTHMIAS       -LATE PRESENTING MYOCARDIAL INFARCTION       -COPD   CLINICAL FOLLOW-UP RECOMMENDED.   MRSA PCR Screening     Status: None   Collection Time: 10/31/14  8:05 PM  Result Value Ref Range   MRSA by PCR NEGATIVE NEGATIVE    Comment:        The GeneXpert MRSA Assay (FDA approved for NASAL specimens only), is one component of a comprehensive MRSA colonization surveillance program. It is not intended to diagnose MRSA infection nor to guide or monitor treatment for MRSA infections.   Glucose, capillary     Status: Abnormal   Collection Time: 10/31/14  8:48 PM  Result Value Ref Range   Glucose-Capillary 154 (H) 65 - 99 mg/dL  Glucose, capillary     Status: Abnormal   Collection Time: 10/31/14 11:29 PM  Result Value Ref Range   Glucose-Capillary 159 (H) 65 - 99 mg/dL   Comment 1 Capillary Specimen   CBC     Status: Abnormal   Collection Time: 11/01/14  3:11 AM  Result Value Ref Range   WBC 15.4 (H) 4.0 - 10.5 K/uL   RBC 4.77 4.22 - 5.81 MIL/uL   Hemoglobin 14.8 13.0 - 17.0 g/dL   HCT 44.2 39.0 - 52.0 %   MCV 92.7 78.0 - 100.0 fL   MCH 31.0 26.0 - 34.0 pg   MCHC 33.5 30.0 - 36.0 g/dL   RDW 13.9 11.5 - 15.5 %   Platelets 185 150 - 400 K/uL  Comprehensive metabolic panel     Status: Abnormal   Collection Time: 11/01/14  3:11 AM  Result Value Ref Range   Sodium 142 135 - 145 mmol/L   Potassium 3.0 (L) 3.5 - 5.1 mmol/L   Chloride 105 101 - 111 mmol/L   CO2 25 22 - 32 mmol/L   Glucose, Bld 199 (H) 65 - 99 mg/dL   BUN 18 6 -   20 mg/dL   Creatinine, Ser 1.23 0.61 - 1.24 mg/dL   Calcium 9.0 8.9 - 10.3 mg/dL   Total Protein 6.4 (L) 6.5 - 8.1 g/dL   Albumin 3.5  3.5 - 5.0 g/dL   AST 54 (H) 15 - 41 U/L   ALT 26 17 - 63 U/L   Alkaline Phosphatase 46 38 - 126 U/L   Total Bilirubin 1.1 0.3 - 1.2 mg/dL   GFR calc non Af Amer 53 (L) >60 mL/min   GFR calc Af Amer >60 >60 mL/min    Comment: (NOTE) The eGFR has been calculated using the CKD EPI equation. This calculation has not been validated in all clinical situations. eGFR's persistently <60 mL/min signify possible Chronic Kidney Disease.    Anion gap 12 5 - 15  CK     Status: Abnormal   Collection Time: 11/01/14  3:11 AM  Result Value Ref Range   Total CK 1626 (H) 49 - 397 U/L  Procalcitonin     Status: None   Collection Time: 11/01/14  3:11 AM  Result Value Ref Range   Procalcitonin <0.10 ng/mL    Comment:        Interpretation: PCT (Procalcitonin) <= 0.5 ng/mL: Systemic infection (sepsis) is not likely. Local bacterial infection is possible. (NOTE)         ICU PCT Algorithm               Non ICU PCT Algorithm    ----------------------------     ------------------------------         PCT < 0.25 ng/mL                 PCT < 0.1 ng/mL     Stopping of antibiotics            Stopping of antibiotics       strongly encouraged.               strongly encouraged.    ----------------------------     ------------------------------       PCT level decrease by               PCT < 0.25 ng/mL       >= 80% from peak PCT       OR PCT 0.25 - 0.5 ng/mL          Stopping of antibiotics                                             encouraged.     Stopping of antibiotics           encouraged.    ----------------------------     ------------------------------       PCT level decrease by              PCT >= 0.25 ng/mL       < 80% from peak PCT        AND PCT >= 0.5 ng/mL            Continuin g antibiotics                                              encouraged.       Continuing antibiotics              encouraged.    ----------------------------     ------------------------------     PCT level increase  compared          PCT > 0.5 ng/mL         with peak PCT AND          PCT >= 0.5 ng/mL             Escalation of antibiotics                                          strongly encouraged.      Escalation of antibiotics        strongly encouraged.   Glucose, capillary     Status: Abnormal   Collection Time: 11/01/14  4:06 AM  Result Value Ref Range   Glucose-Capillary 188 (H) 65 - 99 mg/dL   Comment 1 Capillary Specimen   Magnesium     Status: None   Collection Time: 11/01/14  8:00 AM  Result Value Ref Range   Magnesium 2.0 1.7 - 2.4 mg/dL  Glucose, capillary     Status: Abnormal   Collection Time: 11/01/14  8:23 AM  Result Value Ref Range   Glucose-Capillary 177 (H) 65 - 99 mg/dL   Comment 1 Venous Specimen   Glucose, capillary     Status: Abnormal   Collection Time: 11/01/14 11:40 AM  Result Value Ref Range   Glucose-Capillary 188 (H) 65 - 99 mg/dL   Comment 1 Venous Specimen    Dg Chest 2 View  10/30/2014  CLINICAL DATA:  Altered mental status EXAM: CHEST  2 VIEW COMPARISON:  February 03, 2006 FINDINGS: There is no edema or consolidation. Heart is upper normal in size with pulmonary vascularity within normal limits. Patient is status post coronary artery bypass grafting. There is atherosclerotic change in the aorta. No adenopathy. No pneumothorax. No bone lesions. IMPRESSION: No edema or consolidation. Electronically Signed   By: Lowella Grip III M.D.   On: 10/30/2014 19:50   Ct Head Wo Contrast  10/30/2014  CLINICAL DATA:  81 year old male with altered mental status, found down. EXAM: CT HEAD WITHOUT CONTRAST CT CERVICAL SPINE WITHOUT CONTRAST TECHNIQUE: Multidetector CT imaging of the head and cervical spine was performed following the standard protocol without intravenous contrast. Multiplanar CT image reconstructions of the cervical spine were also generated. COMPARISON:  None. FINDINGS: CT HEAD FINDINGS Atrophy, chronic small-vessel white matter ischemic changes and small  remote bilateral cerebellar infarcts noted. No acute intracranial abnormalities are identified, including mass lesion or mass effect, hydrocephalus, extra-axial fluid collection, midline shift, hemorrhage, or acute infarction. The visualized bony calvarium is unremarkable. CT CERVICAL SPINE FINDINGS Normal alignment noted. There is no evidence of acute fracture, subluxation or prevertebral soft tissue swelling. Moderate to severe degenerative disc disease and spondylosis from C2-C6 identified contributing to moderate central spinal and bony foraminal narrowing. No focal bony lesions are identified. The soft tissue structures and lung apices are unremarkable. IMPRESSION: No evidence of acute intracranial abnormality. Atrophy, chronic small-vessel white matter ischemic changes and small remote bilateral cerebellar infarcts. No static evidence of acute injury to the cervical spine. Moderate to severe degenerative changes from C2-C6 contributing to moderate central spinal and foraminal narrowing. Electronically Signed   By: Margarette Canada M.D.   On: 10/30/2014 19:52   Ct Cervical Spine Wo Contrast  10/30/2014  CLINICAL DATA:  81 year old male  with altered mental status, found down. EXAM: CT HEAD WITHOUT CONTRAST CT CERVICAL SPINE WITHOUT CONTRAST TECHNIQUE: Multidetector CT imaging of the head and cervical spine was performed following the standard protocol without intravenous contrast. Multiplanar CT image reconstructions of the cervical spine were also generated. COMPARISON:  None. FINDINGS: CT HEAD FINDINGS Atrophy, chronic small-vessel white matter ischemic changes and small remote bilateral cerebellar infarcts noted. No acute intracranial abnormalities are identified, including mass lesion or mass effect, hydrocephalus, extra-axial fluid collection, midline shift, hemorrhage, or acute infarction. The visualized bony calvarium is unremarkable. CT CERVICAL SPINE FINDINGS Normal alignment noted. There is no evidence  of acute fracture, subluxation or prevertebral soft tissue swelling. Moderate to severe degenerative disc disease and spondylosis from C2-C6 identified contributing to moderate central spinal and bony foraminal narrowing. No focal bony lesions are identified. The soft tissue structures and lung apices are unremarkable. IMPRESSION: No evidence of acute intracranial abnormality. Atrophy, chronic small-vessel white matter ischemic changes and small remote bilateral cerebellar infarcts. No static evidence of acute injury to the cervical spine. Moderate to severe degenerative changes from C2-C6 contributing to moderate central spinal and foraminal narrowing. Electronically Signed   By: Jeffrey  Hu M.D.   On: 10/30/2014 19:52   Mr Brain Wo Contrast  10/30/2014  CLINICAL DATA:  Found down on kitchen floor, last seen normal at noon today. Aphasic, LEFT-sided gaze. History of hypertension, diabetes, prostate cancer. EXAM: MRI HEAD WITHOUT CONTRAST TECHNIQUE: Multiplanar, multiecho pulse sequences of the brain and surrounding structures were obtained without intravenous contrast. COMPARISON:  CT head October 30, 2014 at 1928 hours FINDINGS: No reduced diffusion to suggest acute ischemia. Punctate foci of susceptibility artifact LEFT cerebellum, RIGHT frontal lobe are nonspecific. O bilateral small cerebellar infarcts. Old RIGHT basal ganglia lacunar infarct. Ventricles are normal for age, mild sulcal effacement at the convexities. Patchy supratentorial white matter T2 hyperintensities without midline shift, mass effect or mass lesions. No abnormal extra-axial fluid collections. Normal major intracranial vascular flow voids seen at the skull base. Status post bilateral ocular lens implants. Small LEFT maxillary mucosal retention cyst without paranasal sinus air-fluid levels. Old LEFT medial orbital blowout fracture. Trace LEFT mastoid effusion. No abnormal sellar expansion. No cerebellar tonsillar ectopia. Patient is  edentulous. IMPRESSION: No acute intracranial process. Involutional changes, superimposed component of suspected normal pressure hydrocephalus. Moderate white matter changes can be seen with chronic small vessel ischemic disease. 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 Electronically Signed   By: Courtnay  Bloomer M.D.   On: 10/30/2014 22:32    ROS: General:no colds or fevers, no weight changes Skin:no rashes or ulcers HEENT:no blurred vision, no congestion CV:see HPI PUL:see HPI GI:no diarrhea constipation or melena, no indigestion GU:no hematuria, no dysuria MS:no joint pain, no claudication Neuro:no syncope, no lightheadedness Endo:no diabetes, no thyroid disease   Blood pressure 157/97, pulse 90, temperature 98.7 F (37.1 C), temperature source Oral, resp. rate 20, height 5' 10.87" (1.8 m), weight 207 lb 7.3 oz (94.1 kg), SpO2 97 %.  Wt Readings from Last 3 Encounters:  10/31/14 207 lb 7.3 oz (94.1 kg)  02/19/14 207 lb 8 oz (94.121 kg)  10/17/13 207 lb 9.6 oz (94.167 kg)    PE: General:Pleasant affect though flat, NAD follows some commands, has difficulty answering questions Skin:Warm and dry, brisk capillary refill HEENT:normocephalic, sclera clear, mucus membranes moist Neck:supple, no JVD, no bruits  Heart:irreg irreg  without murmur, gallup, rub or click Lungs:clear, ant.  without rales, rhonchi, or wheezes Abd:soft,   non tender, + BS, do not palpate liver spleen or masses Ext:1+ lower ext edema, 2+ pedal pulses, 2+ radial pulses Neuro:alert and oriented to his children's names, MAE, follows commands but slowly has to stop and think, moves Lt side faster than Rt.  + facial symmetry Tele:  A fib rate of 98 now   Assessment/Plan Principal Problem:   Acute encephalopathy Active Problems:   Chronic kidney disease, stage 3   Gout, chronic   Encephalopathy acute   Hypertensive urgency   DM (diabetes mellitus), type 2 with renal  complications (HCC)   Secondary hypertension, unspecified   Elevated troponin   PAF (paroxysmal atrial fibrillation) (HCC)  PAF  Initial rhythm atrial tach vs. STach, then with PAF - he rec'd labetalol  during the night also stress of acute encephalopathy -on IV heparin, HR slowly increasing - add iv dilt drip and increase BB CHA2DS2VASc score 5 at least.   Elevated troponin - does have ischemic changes but with neuro involvement difficult to know demand ischemia vs. ACS.  Echo is pending if LV normal would do nuc at some point, Outpt unless change in symptoms  CAD with hx CABG per record and old MI. Have called daughter HCPOA to find more info who is cardiologist   HTN crisis now improved control on IV hydralazine 10 mg every 4 hours, amlodipine 10 mg daily, catapres patch 0.3 mg every 7 days lopressor 25 BID. With adding dilt and increase BB and decrease hydralazine if BP decreases.  Check carotid dopplers    INGOLD,LAURA R  Nurse Practitioner Certified [facility] Pager [phone] or after 5pm or weekends call [phone] 11/01/2014, 1:27 PM   Agree with note written by Laura Ingold RNP  Pt admitted with AMS, focal neuro deficit and HTN. Remote H/O CAD with H/O CABG at [facility] 1995. His cardiologist was Dr. Hank Smith. CT and MRI neg. Trop low grade +. 2D looks nl to me. BP is resistant and labile. Went into Afib with RVR at 5:00 am. . Currently on dilt drip for rate control. His CHA2DSVASC2 score is 5 and he should ultimately be on NOAC. Will increase Metop, get Carotid dopplers and will follow with you.  Berry, Jonathan 11/01/2014 2:28 PM   

## 2014-11-01 NOTE — Progress Notes (Signed)
Bladder scan still showing >200c in bladder, pt still with no UOP since early this am.  IVF were increased this afternoon to 50cc/hr and pt taking POs.  Dr Tana Coast gave verbal order for foley if pt unable to void on his own.  Appears to be having retention issues.  Foley placed under sterile technique.  2 RN's present.  Pericare provided prior to insertion.  Insertion went well with no incedent, urine returned immediately upon insertion.  Pt initial output was 450cc.    Carol Ada, RN

## 2014-11-01 NOTE — Progress Notes (Signed)
Initial Nutrition Assessment  DOCUMENTATION CODES:   Not applicable  INTERVENTION:   Ensure Enlive po daily, each supplement provides 350 kcal and 20 grams of protein  NUTRITION DIAGNOSIS:   Inadequate oral intake related to lethargy/confusion as evidenced by meal completion < 50%.  GOAL:   Patient will meet greater than or equal to 90% of their needs  MONITOR:   PO intake, Supplement acceptance, Diet advancement, Labs, Weight trends, Skin, I & O's  REASON FOR ASSESSMENT:   Malnutrition Screening Tool, Low Braden    ASSESSMENT:   John Romero is a 81 y.o. male with history of CAD status post CABG, diabetes mellitus type 2, hypertension, chronic kidney disease, hyperlipidemia and gout who was doing fine until last known was found to be on the floor when patient's daughter checked on him at his house last evening around 6 PM. As per patient's daughter patient has had dinner with his friend night before and also had breakfast and was seen normal by the neighbors until 1 PM yesterday. Patient was found to be confused not moving his right side and also having left-sided preferential gaze. Patient was brought to the ER CT head and C-spine was unremarkable and MRI of the brain was done which did not show any stroke. Neurologist was consulted and patient had lumbar puncture and patient was empirically started on acyclovir and Keppra at this time. On exam patient is still encephalopathic and does not follow commands. Patient is febrile and tachycardic. As per patient's daughter patient did not have any recent travel or sick contacts and only medication changes were fluid pills. Patient's blood pressures also found to be elevated. As per family patient did not have any recent complaints including chest pain headache shortness of breath nausea vomiting diarrhea abdominal pain.   Pt admitted acute encephalopathy.   Hx obtained mainly by pt sister at bedside, as pt was unable to provide much hx  due to confusion. PTA pt was consuming 3 meals per day (Breakfast: cereal or oatmeal, Lunch: sandwiches, Dinner: meat, starch, and vegetable). Pt sister describes appetite as fair, but denies any changes in appetite. Denies any weight loss, which is consistent with wt hx.   SLP evaluated pt this morning and pt was advanced to a dysphagia 2 diet with thin liquids. Pt was consuming clear liquid diet at time of visit; he ate only jello off his tray, as he stated he was full.   Nutrition-Focused physical exam completed. Findings are no fat depletion, no muscle depletion, and no edema. Pt was very independent PTA.  Case discussed with RN. Suspect intake will improve once pt's mentation improves.  Labs reviewed: K: 3.0.  Diet Order:  DIET DYS 2 Room service appropriate?: Yes; Fluid consistency:: Thin  Skin:  Reviewed, no issues (stage II partial thickness wound sacrum)  Last BM:  PTA  Height:   Ht Readings from Last 1 Encounters:  10/31/14 5' 10.87" (1.8 m)    Weight:   Wt Readings from Last 1 Encounters:  10/31/14 207 lb 7.3 oz (94.1 kg)    Ideal Body Weight:  78.2 kg  BMI:  Body mass index is 29.04 kg/(m^2).  Estimated Nutritional Needs:   Kcal:  1700-1900  Protein:  80-95 grams  Fluid:  1.7-1.9 L  EDUCATION NEEDS:   No education needs identified at this time  Miranda Frese A. Jimmye Norman, RD, LDN, CDE Pager: [phone] After hours Pager: [phone]

## 2014-11-01 NOTE — Progress Notes (Signed)
Pt had no UOP since I/O cath on previous shift.  Pt only receiving ivf at 20cc/hr.  Bladder scan shows >200, but pt denies any urge or pressure in lower abdomin.  Will wait a little longer to rescan for possible spontanious void.   Carol Ada, RN

## 2014-11-01 NOTE — Progress Notes (Addendum)
[facility]                                                                              Patient Demographics  John Romero, is a 81 y.o. male, DOB - [birth year], GYK:[id number]  Admit date - 10/30/2014   Admitting Physician Rise Patience, MD  Outpatient Primary MD for the patient is [name], DO  LOS - 1   Chief Complaint  Patient presents with  . Altered Mental Status  . Fall  . stroke symptoms        Brief HPI   Per Dr. Moise Boring admit note on 10/31/14 John Romero is a 81 y.o. male with history of CAD status post CABG, diabetes mellitus type 2, hypertension, chronic kidney disease, hyperlipidemia and gout who was doing fine until last known was found to be on the floor when patient's daughter checked on him at his house last evening around 6 PM. As per patient's daughter patient has had dinner with his friend night before and also had breakfast and was seen normal by the neighbors until 1 PM yesterday. Patient was found to be confused not moving his right side and also having left-sided preferential gaze. Patient was brought to the ER CT head and C-spine was unremarkable and MRI of the brain was done which did not show any stroke. Neurologist was consulted and patient had lumbar puncture and patient was empirically started on acyclovir and Keppra at this time. On exam patient is still encephalopathic and does not follow commands. Patient is febrile and tachycardic. As per patient's daughter patient did not have any recent travel or sick contacts and only medication changes were fluid pills. Patient's blood pressures also found to be elevated. As per family patient did not have any recent complaints including chest pain headache shortness of breath nausea vomiting diarrhea abdominal pain.    Assessment & Plan    Principal Problem:   Acute encephalopathy unclear etiology,- mental status significantly improving today, alert and oriented x2, although still  somewhat confused. Possible malignant hypertensive encephalopathy vs PRES.   -  influenza panel negative, chest x-ray negative, UA negative for UTI  - Patient underwent lumbar puncture and was placed on empiric antibiotics for meningitis, however spinal tap negative for meningitis.  - EEG showed no seizure or seizure predisposition. ammonia level is normal - MRI of the brain showed no acute intracranial process, involutional changes superimposed component of suspected normal pressure hydrocephalus - ID following, droplet precautions discontinued, all antibiotics and antivirals discontinued  - Neurology also following, no further neurology workup recommended, signed off   Active Problems: New-onset atrial fibrillation with RVR - Per patient's daughter, no prior history of atrial fibrillation (although not sure), heart rate improved after Cardizem. Placed on oral metoprolol, heparin drip. Discussed with patient's daughter on the phone, patient is otherwise functionally active, drives, so far had no risk of falls. - Cardiology consult placed, follow 2-D echo  Rhabdomyolysis -Follow CKs, continue  gentle hydration  Mild elevated troponins - No complaints of any chest pain, obtain 2-D echocardiogram for further workup     mild acute on Chronic kidney disease,  stage 3Likely due to hypertensive urgency - improving Continue gentle hydration  Malignant hypertension 280/150 at the time of admission -  much more alert today, transitioned to oral antihypertensives today.     DM (diabetes mellitus), type 2 with renal complications (HCC)Uncontrolled -  increase Lantus, sliding scale insulin, meal coverage   Code Status: Full CODE STATUS   Family Communication: Discussed in detail with the patient, all imaging results, lab results explained to the patient's daughter on phone   Disposition Plan: Continue to monitor and stepdown unit today   Time Spent in minutes   25 minutes  Procedures  MRI  brain   spinal tap  EEG  Consults   Neurology   infectious disease  DVT Prophylaxis   SCD's  Medications  Scheduled Meds: . amLODipine  10 mg Oral Daily  . antiseptic oral rinse  7 mL Mouth Rinse BID  . aspirin EC  325 mg Oral Daily  . chlorhexidine  15 mL Mouth Rinse BID  . cloNIDine  0.3 mg Transdermal Weekly  . insulin aspart  0-9 Units Subcutaneous Q4H  . insulin detemir  25 Units Subcutaneous Daily  . metoprolol tartrate  25 mg Oral BID   Continuous Infusions: . sodium chloride    . heparin 900 Units/hr (11/01/14 1153)   PRN Meds:.sodium chloride, acetaminophen, hydrALAZINE, labetalol, metoprolol   Antibiotics   Anti-infectives    Start     Dose/Rate Route Frequency Ordered Stop   10/31/14 1200  vancomycin (VANCOCIN) IVPB 750 mg/150 ml premix  Status:  Discontinued     750 mg 150 mL/hr over 60 Minutes Intravenous Every 12 hours 10/31/14 0123 11/01/14 0758   10/31/14 0730  acyclovir (ZOVIRAX) 800 mg in dextrose 5 % 150 mL IVPB  Status:  Discontinued     800 mg 166 mL/hr over 60 Minutes Intravenous 3 times per day 10/31/14 0123 11/01/14 0758   10/31/14 0130  vancomycin (VANCOCIN) IVPB 1000 mg/200 mL premix     1,000 mg 200 mL/hr over 60 Minutes Intravenous STAT 10/31/14 0123 10/31/14 0429   10/31/14 0130  cefTRIAXone (ROCEPHIN) 2 g in dextrose 5 % 50 mL IVPB  Status:  Discontinued     2 g 100 mL/hr over 30 Minutes Intravenous Every 12 hours 10/31/14 0123 11/01/14 0758   10/31/14 0115  ampicillin (OMNIPEN) 2 g in sodium chloride 0.9 % 50 mL IVPB  Status:  Discontinued     2 g 150 mL/hr over 20 Minutes Intravenous 6 times per day 10/31/14 0114 11/01/14 0758   10/30/14 2345  acyclovir (ZOVIRAX) 800 mg in dextrose 5 % 150 mL IVPB     800 mg 166 mL/hr over 60 Minutes Intravenous  Once 10/30/14 2343 10/31/14 0207        Subjective:   Lequan Dobratz was seen and examined today.  much more alert and awake today, appropriately answers to some cautions. Overnight went  into atrial fibrillation with RVR, was given Cardizem 10 mg 1. No fevers or chills, no nausea, vomiting. Denies any chest pain or shortness of breath.   Objective:   Blood pressure 157/97, pulse 90, temperature 98.7 F (37.1 C), temperature source Oral, resp. rate 20, height 5' 10.87" (1.8 m), weight 94.1 kg (207 lb 7.3 oz), SpO2 97 %.  Wt Readings from Last 3 Encounters:  10/31/14 94.1 kg (207 lb 7.3 oz)  02/19/14 94.121 kg (207 lb 8 oz)  10/17/13 94.167 kg (207 lb 9.6 oz)     Intake/Output Summary (Last  24 hours) at 11/01/14 1244 Last data filed at 11/01/14 0701  Gross per 24 hour  Intake   2882 ml  Output   1025 ml  Net   1857 ml    Exam  General:Alert and oriented 2, NAD  HEENT:  PERRLA, EOMI, Anicteric Sclera, mucous membranes moist.   Neck: Supple, no JVD, no masses  CVS:  irregularly irregular  Respiratory : Clear to auscultation bilaterally, no wheezing, rales or rhonchi  Abdomen: Soft, nontender, nondistended, + bowel sounds  Ext: no cyanosis clubbing or edema  Neuro: cranial nerves II-12 intact, strength 5/5 in upper and lower extremities bilaterally   Skin: No rashes  Psych:  alert and oriented 2, normal affect   Data Review   Micro Results Recent Results (from the past 240 hour(s))  Fungus Culture with Smear     Status: None (Preliminary result)   Collection Time: 10/30/14 11:50 PM  Result Value Ref Range Status   Specimen Description CSF  Final   Special Requests NONE  Final   Fungal Smear   Final    NO YEAST OR FUNGAL ELEMENTS SEEN Performed at [facility]    Culture   Final    CULTURE IN [place] Performed at [facility]    Report Status PENDING  Incomplete  CSF culture     Status: None (Preliminary result)   Collection Time: 10/30/14 11:55 PM  Result Value Ref Range Status   Specimen Description CSF  Final   Special Requests NONE  Final   Gram Stain   Final    CYTOSPIN SMEAR WBC PRESENT,BOTH PMN  AND MONONUCLEAR NO ORGANISMS SEEN    Culture NO GROWTH 1 DAY  Final   Report Status PENDING  Incomplete  MRSA PCR Screening     Status: None   Collection Time: 10/31/14  8:05 PM  Result Value Ref Range Status   MRSA by PCR NEGATIVE NEGATIVE Final    Comment:        The GeneXpert MRSA Assay (FDA approved for NASAL specimens only), is one component of a comprehensive MRSA colonization surveillance program. It is not intended to diagnose MRSA infection nor to guide or monitor treatment for MRSA infections.     Radiology Reports Dg Chest 2 View  10/30/2014  CLINICAL DATA:  Altered mental status EXAM: CHEST  2 VIEW COMPARISON:  February 03, 2006 FINDINGS: There is no edema or consolidation. Heart is upper normal in size with pulmonary vascularity within normal limits. Patient is status post coronary artery bypass grafting. There is atherosclerotic change in the aorta. No adenopathy. No pneumothorax. No bone lesions. IMPRESSION: No edema or consolidation. Electronically Signed   By: Lowella Grip III M.D.   On: 10/30/2014 19:50   Ct Head Wo Contrast  10/30/2014  CLINICAL DATA:  81 year old male with altered mental status, found down. EXAM: CT HEAD WITHOUT CONTRAST CT CERVICAL SPINE WITHOUT CONTRAST TECHNIQUE: Multidetector CT imaging of the head and cervical spine was performed following the standard protocol without intravenous contrast. Multiplanar CT image reconstructions of the cervical spine were also generated. COMPARISON:  None. FINDINGS: CT HEAD FINDINGS Atrophy, chronic small-vessel white matter ischemic changes and small remote bilateral cerebellar infarcts noted. No acute intracranial abnormalities are identified, including mass lesion or mass effect, hydrocephalus, extra-axial fluid collection, midline shift, hemorrhage, or acute infarction. The visualized bony calvarium is unremarkable. CT CERVICAL SPINE FINDINGS Normal alignment noted. There is no evidence of acute fracture,  subluxation or prevertebral  soft tissue swelling. Moderate to severe degenerative disc disease and spondylosis from C2-C6 identified contributing to moderate central spinal and bony foraminal narrowing. No focal bony lesions are identified. The soft tissue structures and lung apices are unremarkable. IMPRESSION: No evidence of acute intracranial abnormality. Atrophy, chronic small-vessel white matter ischemic changes and small remote bilateral cerebellar infarcts. No static evidence of acute injury to the cervical spine. Moderate to severe degenerative changes from C2-C6 contributing to moderate central spinal and foraminal narrowing. Electronically Signed   By: Margarette Canada M.D.   On: 10/30/2014 19:52   Ct Cervical Spine Wo Contrast  10/30/2014  CLINICAL DATA:  81 year old male with altered mental status, found down. EXAM: CT HEAD WITHOUT CONTRAST CT CERVICAL SPINE WITHOUT CONTRAST TECHNIQUE: Multidetector CT imaging of the head and cervical spine was performed following the standard protocol without intravenous contrast. Multiplanar CT image reconstructions of the cervical spine were also generated. COMPARISON:  None. FINDINGS: CT HEAD FINDINGS Atrophy, chronic small-vessel white matter ischemic changes and small remote bilateral cerebellar infarcts noted. No acute intracranial abnormalities are identified, including mass lesion or mass effect, hydrocephalus, extra-axial fluid collection, midline shift, hemorrhage, or acute infarction. The visualized bony calvarium is unremarkable. CT CERVICAL SPINE FINDINGS Normal alignment noted. There is no evidence of acute fracture, subluxation or prevertebral soft tissue swelling. Moderate to severe degenerative disc disease and spondylosis from C2-C6 identified contributing to moderate central spinal and bony foraminal narrowing. No focal bony lesions are identified. The soft tissue structures and lung apices are unremarkable. IMPRESSION: No evidence of acute  intracranial abnormality. Atrophy, chronic small-vessel white matter ischemic changes and small remote bilateral cerebellar infarcts. No static evidence of acute injury to the cervical spine. Moderate to severe degenerative changes from C2-C6 contributing to moderate central spinal and foraminal narrowing. Electronically Signed   By: Margarette Canada M.D.   On: 10/30/2014 19:52   Mr Brain Wo Contrast  10/30/2014  CLINICAL DATA:  Found down on kitchen floor, last seen normal at noon today. Aphasic, LEFT-sided gaze. History of hypertension, diabetes, prostate cancer. EXAM: MRI HEAD WITHOUT CONTRAST TECHNIQUE: Multiplanar, multiecho pulse sequences of the brain and surrounding structures were obtained without intravenous contrast. COMPARISON:  CT head October 30, 2014 at 1928 hours FINDINGS: No reduced diffusion to suggest acute ischemia. Punctate foci of susceptibility artifact LEFT cerebellum, RIGHT frontal lobe are nonspecific. O bilateral small cerebellar infarcts. Old RIGHT basal ganglia lacunar infarct. Ventricles are normal for age, mild sulcal effacement at the convexities. Patchy supratentorial white matter T2 hyperintensities without midline shift, mass effect or mass lesions. No abnormal extra-axial fluid collections. Normal major intracranial vascular flow voids seen at the skull base. Status post bilateral ocular lens implants. Small LEFT maxillary mucosal retention cyst without paranasal sinus air-fluid levels. Old LEFT medial orbital blowout fracture. Trace LEFT mastoid effusion. No abnormal sellar expansion. No cerebellar tonsillar ectopia. Patient is edentulous. IMPRESSION: No acute intracranial process. Involutional changes, superimposed component of suspected normal pressure hydrocephalus. Moderate white matter changes can be seen with chronic small vessel ischemic disease. 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 Electronically Signed   By: Elon Alas M.D.    On: 10/30/2014 22:32    CBC  Recent Labs Lab 10/30/14 1950 10/31/14 0429 11/01/14 0311  WBC 12.6* 11.4* 15.4*  HGB 15.7 15.8 14.8  HCT 46.2 46.6 44.2  PLT 208 207 185  MCV 90.9 91.9 92.7  MCH 30.9 31.2 31.0  MCHC 34.0 33.9 33.5  RDW 13.0 13.4  13.9  LYMPHSABS 0.8 1.0  --   MONOABS 0.3 0.3  --   EOSABS 0.0 0.0  --   BASOSABS 0.0 0.0  --     Chemistries   Recent Labs Lab 10/30/14 1950 10/31/14 0429 11/01/14 0311 11/01/14 0800  NA 138 135 142  --   K 3.2* 3.2* 3.0*  --   CL 98* 95* 105  --   CO2 24 22 25   --   GLUCOSE 254* 364* 199*  --   BUN 15 13 18   --   CREATININE 1.31* 1.45* 1.23  --   CALCIUM 9.3 9.0 9.0  --   MG  --   --   --  2.0  AST 42* 58* 54*  --   ALT 28 31 26   --   ALKPHOS 68 62 46  --   BILITOT 1.3* 1.8* 1.1  --    ------------------------------------------------------------------------------------------------------------------ estimated creatinine clearance is 54.1 mL/min (by C-G formula based on Cr of 1.23). ------------------------------------------------------------------------------------------------------------------ No results for input(s): HGBA1C in the last 72 hours. ------------------------------------------------------------------------------------------------------------------ No results for input(s): CHOL, HDL, LDLCALC, TRIG, CHOLHDL, LDLDIRECT in the last 72 hours. ------------------------------------------------------------------------------------------------------------------  Recent Labs  10/31/14 1226  TSH 1.004   ------------------------------------------------------------------------------------------------------------------  Recent Labs  10/31/14 1226  VITAMINB12 418    Coagulation profile  Recent Labs Lab 10/30/14 1950 10/31/14 0429  INR 1.04 1.09    No results for input(s): DDIMER in the last 72 hours.  Cardiac Enzymes  Recent Labs Lab 10/31/14 0753 10/31/14 1226 10/31/14 1830  TROPONINI 0.32*  0.39* 0.48*   ------------------------------------------------------------------------------------------------------------------ Invalid input(s): POCBNP   Recent Labs  10/31/14 1709 10/31/14 2048 10/31/14 2329 11/01/14 0406 11/01/14 0823 11/01/14 1140  GLUCAP 272* 154* 159* 188* 177* 188*     RAI,RIPUDEEP M.D. [facility] 11/01/2014, 12:44 PM  Pager: [phone] Between 7am to 7pm - call Pager - [phone]  After 7pm go to [email or web address] - password [email or web address]  Call night coverage person covering after 7pm

## 2014-11-01 NOTE — Progress Notes (Signed)
Patient noted to be in a-fib RVR at 0440 HR in 120s. EKG to confirm rhythm change done.  hospitalist on call notified, order for 10mg  IV cardizem push.  Patient also has not had any UOP since shift change, bladder scanner showing amount >300, order for in/out cath. Obtained.  Will continue to monitor closely.

## 2014-11-02 ENCOUNTER — Other Ambulatory Visit (HOSPITAL_COMMUNITY): Payer: Medicare Other

## 2014-11-02 ENCOUNTER — Inpatient Hospital Stay (HOSPITAL_COMMUNITY): Payer: Medicare Other

## 2014-11-02 DIAGNOSIS — R4182 Altered mental status, unspecified: Secondary | ICD-10-CM

## 2014-11-02 LAB — BASIC METABOLIC PANEL
ANION GAP: 8 (ref 5–15)
BUN: 20 mg/dL (ref 6–20)
CO2: 25 mmol/L (ref 22–32)
Calcium: 8.8 mg/dL — ABNORMAL LOW (ref 8.9–10.3)
Chloride: 107 mmol/L (ref 101–111)
Creatinine, Ser: 1.16 mg/dL (ref 0.61–1.24)
GFR, EST NON AFRICAN AMERICAN: 57 mL/min — AB (ref >60)
GLUCOSE: 230 mg/dL — AB (ref 65–99)
Potassium: 3.1 mmol/L — ABNORMAL LOW (ref 3.5–5.1)
SODIUM: 140 mmol/L (ref 135–145)

## 2014-11-02 LAB — GLUCOSE, CAPILLARY
Glucose-Capillary: 184 mg/dL — ABNORMAL HIGH (ref 65–99)
Glucose-Capillary: 261 mg/dL — ABNORMAL HIGH (ref 65–99)
Glucose-Capillary: 206 mg/dL — ABNORMAL HIGH (ref 65–99)
Glucose-Capillary: 134 mg/dL — ABNORMAL HIGH (ref 65–99)

## 2014-11-02 LAB — CBC
HCT: 42.6 % (ref 39.0–52.0)
Hemoglobin: 14.1 g/dL (ref 13.0–17.0)
MCH: 30.9 pg (ref 26.0–34.0)
MCHC: 33.1 g/dL (ref 30.0–36.0)
MCV: 93.4 fL (ref 78.0–100.0)
Platelets: 150 10*3/uL (ref 150–400)
RBC: 4.56 MIL/uL (ref 4.22–5.81)
RDW: 13.8 % (ref 11.5–15.5)
WBC: 10.6 10*3/uL — ABNORMAL HIGH (ref 4.0–10.5)

## 2014-11-02 LAB — HEMOGLOBIN A1C
Hgb A1c MFr Bld: 9.7 % — ABNORMAL HIGH (ref 4.8–5.6)
MEAN PLASMA GLUCOSE: 232 mg/dL

## 2014-11-02 LAB — HEPARIN LEVEL (UNFRACTIONATED)
HEPARIN UNFRACTIONATED: 0.23 [IU]/mL — AB (ref 0.30–0.70)
HEPARIN UNFRACTIONATED: 0.41 [IU]/mL (ref 0.30–0.70)

## 2014-11-02 LAB — ENTEROVIRUS PCR: Enterovirus PCR: NEGATIVE

## 2014-11-02 LAB — PROCALCITONIN: Procalcitonin: <0.1 ng/mL

## 2014-11-02 LAB — VARICELLA ZOSTER ANTIBODY, IGG: Varicella IgG: 581 index (ref >165)

## 2014-11-02 MED ORDER — METOPROLOL TARTRATE 50 MG PO TABS
75.0000 mg | ORAL_TABLET | Freq: Two times a day (BID) | ORAL | Status: DC
  Administered 2014-11-02 – 2014-11-05 (×6): 75 mg via ORAL
  Filled 2014-11-02 (×12): qty 1

## 2014-11-02 MED ORDER — INSULIN ASPART 100 UNIT/ML ~~LOC~~ SOLN
5.0000 [IU] | Freq: Three times a day (TID) | SUBCUTANEOUS | Status: DC
  Administered 2014-11-02 – 2014-11-05 (×10): 5 [IU] via SUBCUTANEOUS

## 2014-11-02 MED ORDER — INSULIN DETEMIR 100 UNIT/ML ~~LOC~~ SOLN
35.0000 [IU] | Freq: Every day | SUBCUTANEOUS | Status: DC
  Administered 2014-11-02 – 2014-11-05 (×4): 35 [IU] via SUBCUTANEOUS
  Filled 2014-11-02 (×4): qty 0.35

## 2014-11-02 MED ORDER — POTASSIUM CHLORIDE 10 MEQ/100ML IV SOLN
10.0000 meq | INTRAVENOUS | Status: AC
  Administered 2014-11-02 (×2): 10 meq via INTRAVENOUS
  Filled 2014-11-02 (×2): qty 100

## 2014-11-02 NOTE — Progress Notes (Signed)
ANTICOAGULATION CONSULT NOTE - Follow Up Consult  Pharmacy Consult for Heparin  Indication: atrial fibrillation  No Known Allergies  Vital Signs: Temp: 98.7 F (37.1 C) (10/15 0340) Temp Source: Oral (10/15 0340) BP: 141/75 mmHg (10/15 0340) Pulse Rate: 77 (10/15 0340)  Labs:  Recent Labs  10/30/14 1950  10/31/14 0344 10/31/14 0429 10/31/14 0753 10/31/14 1226 10/31/14 1227 10/31/14 1830 11/01/14 0311 11/01/14 1837 11/02/14 0352  HGB 15.7  --   --  15.8  --   --   --   --  14.8  --  14.1  HCT 46.2  --   --  46.6  --   --   --   --  44.2  --  42.6  PLT 208  --   --  207  --   --   --   --  185  --  150  APTT 26  --   --  28  --   --   --   --   --   --   --   LABPROT 13.8  --   --  14.3  --   --   --   --   --   --   --   INR 1.04  --   --  1.09  --   --   --   --   --   --   --   HEPARINUNFRC  --   --   --   --   --   --   --   --   --  0.10* 0.23*  CREATININE 1.31*  --   --  1.45*  --   --   --   --  1.23  --  1.16  CKTOTAL 531*  --  1806*  --   --   --  1647*  --  1626*  --   --   TROPONINI  --   < > 0.32*  --  0.32* 0.39*  --  0.48*  --   --   --   < > = values in this interval not displayed.  Estimated Creatinine Clearance: 57.4 mL/min (by C-G formula based on Cr of 1.16).   Assessment: Sub-therapeutic heparin level despite rate increase, no issues per RN.   Goal of Therapy:  Heparin level 0.3-0.7 units/ml Monitor platelets by anticoagulation protocol: Yes   Plan:  -Increase heparin to 1300 units/hr -1300 HL  Lillyauna Jenkinson, [place] 11/02/2014,4:41 AM

## 2014-11-02 NOTE — Progress Notes (Signed)
CRITICAL VALUE ALERT  Critical value received:  Potassium 3.1  Date of notification:  No notification received.  MD notified: Paged T. Callahan at [phone] to inform of morning Potassium value.  New orders received.  Vicie Mutters, RN

## 2014-11-02 NOTE — Progress Notes (Signed)
ANTICOAGULATION CONSULT NOTE   Pharmacy Consult for Heparin Indication: atrial fibrillation  No Known Allergies  Patient Measurements: Height: 5' 10.87" (180 cm) Weight: 207 lb 7.3 oz (94.1 kg) IBW/kg (Calculated) : 74.99   Vital Signs: Temp: 98.1 F (36.7 C) (10/15 1217) Temp Source: Oral (10/15 1217) BP: 166/79 mmHg (10/15 1217) Pulse Rate: 83 (10/15 1217)  Labs:  Recent Labs  10/30/14 1950  10/31/14 0344 10/31/14 0429 10/31/14 0753 10/31/14 1226 10/31/14 1227 10/31/14 1830 11/01/14 0311 11/01/14 1837 11/02/14 0352 11/02/14 1257  HGB 15.7  --   --  15.8  --   --   --   --  14.8  --  14.1  --   HCT 46.2  --   --  46.6  --   --   --   --  44.2  --  42.6  --   PLT 208  --   --  207  --   --   --   --  185  --  150  --   APTT 26  --   --  28  --   --   --   --   --   --   --   --   LABPROT 13.8  --   --  14.3  --   --   --   --   --   --   --   --   INR 1.04  --   --  1.09  --   --   --   --   --   --   --   --   HEPARINUNFRC  --   --   --   --   --   --   --   --   --  0.10* 0.23* 0.41  CREATININE 1.31*  --   --  1.45*  --   --   --   --  1.23  --  1.16  --   CKTOTAL 531*  --  1806*  --   --   --  1647*  --  1626*  --   --   --   TROPONINI  --   < > 0.32*  --  0.32* 0.39*  --  0.48*  --   --   --   --   < > = values in this interval not displayed.  Estimated Creatinine Clearance: 57.4 mL/min (by C-G formula based on Cr of 1.16).   Medical History: Past Medical History  Diagnosis Date  . Type II or unspecified type diabetes mellitus without mention of complication, uncontrolled   . Other and unspecified hyperlipidemia   . Gout, unspecified   . Unspecified essential hypertension   . Type II or unspecified type diabetes mellitus with renal manifestations, uncontrolled   . 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 Gout, unspecified   . Type II or unspecified type diabetes mellitus without mention of complication, uncontrolled   . First  degree atrioventricular block   . Malignant neoplasm of prostate ([place])   . Malignant neoplasm of prostate ([place])   . Type II or unspecified type diabetes mellitus without mention of complication, not stated as uncontrolled   . Other and unspecified hyperlipidemia   . Unspecified tinnitus   . Unspecified essential hypertension   . Old myocardial infarction   . Coronary atherosclerosis of unspecified type of vessel, native or graft   . Edema   .  Undiagnosed cardiac murmurs   . Undiagnosed cardiac murmurs     Assessment: 84yom admitted with MS changes improved today and MRI neg. New Afib CVR on metoprolol. Started on heparin drip 1300 uts/hr HL 0.41 at goal.   No bleeding issues noted. CBC stable  If brige to warfarin may want to consider dec ASA to 81mg .  Goal of Therapy:  Heparin level 0.3-0.7 units/ml Monitor platelets by anticoagulation protocol: Yes   Plan:  Continue heparin 1300 uts/hr HL, CBC daily  Bonnita Nasuti Pharm.D. CPP, BCPS Clinical Pharmacist [phone] 11/02/2014 2:09 PM

## 2014-11-02 NOTE — Progress Notes (Signed)
Subjective: No CP  NO SOB Objective: Filed Vitals:   11/02/14 0530 11/02/14 0600 11/02/14 0630 11/02/14 0752  BP: 151/81 146/81 164/81 159/76  Pulse: 72 75 73 83  Temp:    98.1 F (36.7 C)  TempSrc:    Oral  Resp: 16 18 20 16   Height:      Weight:      SpO2: 94% 94% 93% 93%   Weight change:   Intake/Output Summary (Last 24 hours) at 11/02/14 0807 Last data filed at 11/02/14 9798  Gross per 24 hour  Intake 2103.06 ml  Output    760 ml  Net 1343.06 ml    General: Alert, awake, oriented x3, in no acute distress Neck:  JVP is normal Heart: Regular rate and rhythm, without murmurs, rubs, gallops.  Lungs: Clear to auscultation.  No rales or wheezes. Exemities:  No edema.   Neuro: Grossly intact, nonfocal.  Tele  SR   Lab Results: Results for orders placed or performed during the hospital encounter of 10/30/14 (from the past 24 hour(s))  Glucose, capillary     Status: Abnormal   Collection Time: 11/01/14  8:23 AM  Result Value Ref Range   Glucose-Capillary 177 (H) 65 - 99 mg/dL   Comment 1 Venous Specimen   Glucose, capillary     Status: Abnormal   Collection Time: 11/01/14 11:40 AM  Result Value Ref Range   Glucose-Capillary 188 (H) 65 - 99 mg/dL   Comment 1 Venous Specimen   Glucose, capillary     Status: Abnormal   Collection Time: 11/01/14  4:47 PM  Result Value Ref Range   Glucose-Capillary 244 (H) 65 - 99 mg/dL   Comment 1 Notify RN   Hemoglobin A1c     Status: Abnormal   Collection Time: 11/01/14  5:14 PM  Result Value Ref Range   Hgb A1c MFr Bld 9.7 (H) 4.8 - 5.6 %   Mean Plasma Glucose 232 mg/dL  Heparin level (unfractionated)     Status: Abnormal   Collection Time: 11/01/14  6:37 PM  Result Value Ref Range   Heparin Unfractionated 0.10 (L) 0.30 - 0.70 IU/mL  Glucose, capillary     Status: Abnormal   Collection Time: 11/01/14  9:06 PM  Result Value Ref Range   Glucose-Capillary 172 (H) 65 - 99 mg/dL   Comment 1 Capillary Specimen   CBC      Status: Abnormal   Collection Time: 11/02/14  3:52 AM  Result Value Ref Range   WBC 10.6 (H) 4.0 - 10.5 K/uL   RBC 4.56 4.22 - 5.81 MIL/uL   Hemoglobin 14.1 13.0 - 17.0 g/dL   HCT 42.6 39.0 - 52.0 %   MCV 93.4 78.0 - 100.0 fL   MCH 30.9 26.0 - 34.0 pg   MCHC 33.1 30.0 - 36.0 g/dL   RDW 13.8 11.5 - 15.5 %   Platelets 150 150 - 400 K/uL  Procalcitonin     Status: None   Collection Time: 11/02/14  3:52 AM  Result Value Ref Range   Procalcitonin <0.10 ng/mL  Basic metabolic panel     Status: Abnormal   Collection Time: 11/02/14  3:52 AM  Result Value Ref Range   Sodium 140 135 - 145 mmol/L   Potassium 3.1 (L) 3.5 - 5.1 mmol/L   Chloride 107 101 - 111 mmol/L   CO2 25 22 - 32 mmol/L   Glucose, Bld 230 (H) 65 - 99 mg/dL   BUN 20 6 -  20 mg/dL   Creatinine, Ser 1.16 0.61 - 1.24 mg/dL   Calcium 8.8 (L) 8.9 - 10.3 mg/dL   GFR calc non Af Amer 57 (L) >60 mL/min   GFR calc Af Amer >60 >60 mL/min   Anion gap 8 5 - 15  Heparin level (unfractionated)     Status: Abnormal   Collection Time: 11/02/14  3:52 AM  Result Value Ref Range   Heparin Unfractionated 0.23 (L) 0.30 - 0.70 IU/mL    Studies/Results: No results found.  Medications: Reviewed   @PROBHOSP @  1  Atrial fib  CHADSVASC2 5    Pt is back in SR  Pt on amlodipine and metoprolol  I would increase metoprolol and stop dilt  Follow    2.  CAD  Hx of CABG  I am not convinced of active ischemia.  Follow    3.  HTN    BP is up  Follow on amlodipine and lopressor  Increase lopressor.      LOS: 2 days   Dorris Carnes 11/02/2014, 8:07 AM

## 2014-11-02 NOTE — Progress Notes (Signed)
VASCULAR LAB PRELIMINARY  PRELIMINARY  PRELIMINARY  PRELIMINARY  Carotid duplex  completed.    Preliminary report:  Right:  1-39% ICA stenosis in the high end of the range.  Left:  1-39% ICA stenosis.  Bilateral:  Vertebral artery flow is antegrade.    Gordie Belvin, RVT 11/02/2014, 10:50 AM

## 2014-11-02 NOTE — Progress Notes (Signed)
[facility]                                                                              Patient Demographics  John Romero, is a 81 y.o. male, DOB - [birth year], ION:[id number]  Admit date - 10/30/2014   Admitting Physician Rise Patience, MD  Outpatient Primary MD for the patient is [name], DO  LOS - 2   Chief Complaint  Patient presents with  . Altered Mental Status  . Fall  . stroke symptoms        Brief HPI   Per Dr. Moise Boring admit note on 10/31/14 John Romero is a 81 y.o. male with history of CAD status post CABG, diabetes mellitus type 2, hypertension, chronic kidney disease, hyperlipidemia and gout who was doing fine until last known was found to be on the floor when patient's daughter checked on him at his house last evening around 6 PM. As per patient's daughter patient has had dinner with his friend night before and also had breakfast and was seen normal by the neighbors until 1 PM yesterday. Patient was found to be confused not moving his right side and also having left-sided preferential gaze. Patient was brought to the ER CT head and C-spine was unremarkable and MRI of the brain was done which did not show any stroke. Neurologist was consulted and patient had lumbar puncture and patient was empirically started on acyclovir and Keppra at this time. On exam patient is still encephalopathic and does not follow commands. Patient is febrile and tachycardic. As per patient's daughter patient did not have any recent travel or sick contacts and only medication changes were fluid pills. Patient's blood pressures also found to be elevated. As per family patient did not have any recent complaints including chest pain headache shortness of breath nausea vomiting diarrhea abdominal pain.    Assessment & Plan    Principal Problem:   Acute encephalopathy unclear etiology,- mental status close to baseline today, alert and oriented x3. Possible malignant  hypertensive encephalopathy vs PRES.   -  influenza panel negative, chest x-ray negative, UA negative for UTI  - Patient underwent lumbar puncture and was placed on empiric antibiotics for meningitis, however spinal tap negative for meningitis.  - EEG showed no seizure or seizure predisposition. ammonia level is normal - MRI of the brain showed no acute intracranial process, involutional changes superimposed component of suspected normal pressure hydrocephalus - ID following, droplet precautions discontinued, all antibiotics and antivirals were discontinued  - Neurology also following, no further neurology workup recommended, signed off   Active Problems: New-onset atrial fibrillation with RVR - Now in normal sinus rhythm, heart rate controlled on Cardizem drip. Cardiology following, plan to DC Cardizem drip today, increase metoprolol - 2-D echo showed EF of 55-60% - on heparin gtt, cardiology recommending NOAC  Rhabdomyolysis -Follow CKs, continue  gentle hydration  Mild elevated troponins - No complaints of any chest pain, 2-D echo with preserved EF 55-60%     mild acute on Chronic kidney disease, stage 3, Likely due to hypertensive urgency -  resolved with IV fluid hydration  Malignant hypertension  280/150 at the time of admission -Alert and oriented today, BP is improving, cardiology increased metoprolol today     DM (diabetes mellitus), type 2 with renal complications (HCC)Uncontrolled -  increase Lantus, sliding scale insulin, inc meal coverage   Code Status: Full CODE STATUS   Family Communication: Discussed in detail with the patient, all imaging results, lab results explained to the patient. Also called patient's daughter on phone   Disposition Plan: Transfer to telemetry floor after Cardizem drip is off  Time Spent in minutes   25 minutes  Procedures  MRI brain   spinal tap  EEG  Consults   Neurology   infectious disease  DVT Prophylaxis    SCD's  Medications  Scheduled Meds: . amLODipine  10 mg Oral Daily  . antiseptic oral rinse  7 mL Mouth Rinse BID  . aspirin EC  325 mg Oral Daily  . cloNIDine  0.3 mg Transdermal Weekly  . feeding supplement (ENSURE ENLIVE)  237 mL Oral Q24H  . insulin aspart  0-15 Units Subcutaneous TID WC  . insulin aspart  0-5 Units Subcutaneous QHS  . insulin aspart  3 Units Subcutaneous TID WC  . insulin detemir  35 Units Subcutaneous Daily  . metoprolol tartrate  75 mg Oral BID   Continuous Infusions: . sodium chloride Stopped (11/01/14 1300)  . heparin 1,300 Units/hr (11/02/14 0818)   PRN Meds:.sodium chloride, acetaminophen, hydrALAZINE, labetalol, metoprolol   Antibiotics   Anti-infectives    Start     Dose/Rate Route Frequency Ordered Stop   10/31/14 1200  vancomycin (VANCOCIN) IVPB 750 mg/150 ml premix  Status:  Discontinued     750 mg 150 mL/hr over 60 Minutes Intravenous Every 12 hours 10/31/14 0123 11/01/14 0758   10/31/14 0730  acyclovir (ZOVIRAX) 800 mg in dextrose 5 % 150 mL IVPB  Status:  Discontinued     800 mg 166 mL/hr over 60 Minutes Intravenous 3 times per day 10/31/14 0123 11/01/14 0758   10/31/14 0130  vancomycin (VANCOCIN) IVPB 1000 mg/200 mL premix     1,000 mg 200 mL/hr over 60 Minutes Intravenous STAT 10/31/14 0123 10/31/14 0429   10/31/14 0130  cefTRIAXone (ROCEPHIN) 2 g in dextrose 5 % 50 mL IVPB  Status:  Discontinued     2 g 100 mL/hr over 30 Minutes Intravenous Every 12 hours 10/31/14 0123 11/01/14 0758   10/31/14 0115  ampicillin (OMNIPEN) 2 g in sodium chloride 0.9 % 50 mL IVPB  Status:  Discontinued     2 g 150 mL/hr over 20 Minutes Intravenous 6 times per day 10/31/14 0114 11/01/14 0758   10/30/14 2345  acyclovir (ZOVIRAX) 800 mg in dextrose 5 % 150 mL IVPB     800 mg 166 mL/hr over 60 Minutes Intravenous  Once 10/30/14 2343 10/31/14 0207        Subjective:   [name] was seen and examined today. Alert and oriented, denies any specific  complaints, still does not remember what brought him to the hospital. No fevers or chills, no nausea, vomiting, abdominal pain. Denies any chest pain or shortness of breath.   Objective:   Blood pressure 155/74, pulse 79, temperature 98.1 F (36.7 C), temperature source Oral, resp. rate 22, height 5' 10.87" (1.8 m), weight 94.1 kg (207 lb 7.3 oz), SpO2 93 %.  Wt Readings from Last 3 Encounters:  10/31/14 94.1 kg (207 lb 7.3 oz)  02/19/14 94.121 kg (207 lb 8 oz)  10/17/13 94.167 kg (207 lb 9.6  oz)     Intake/Output Summary (Last 24 hours) at 11/02/14 1125 Last data filed at 11/02/14 0800  Gross per 24 hour  Intake 2139.06 ml  Output    760 ml  Net 1379.06 ml    Exam  General:Alert and oriented 3, NAD  HEENT:  PERRLA, EOMI,   Neck: Supple, no JVD, no masses  CVS:  S1S2 clear, RRR  Respiratory : CTAB  Abdomen: Soft, nontender, nondistended, + bowel sounds  Ext: no cyanosis clubbing or edema  Neuro: cranial nerves II-12 intact, strength 5/5 in upper and lower extremities bilaterally   Skin: No rashes  Psych:  alert and oriented 3, normal affect   Data Review   Micro Results Recent Results (from the past 240 hour(s))  Fungus Culture with Smear     Status: None (Preliminary result)   Collection Time: 10/30/14 11:50 PM  Result Value Ref Range Status   Specimen Description CSF  Final   Special Requests NONE  Final   Fungal Smear   Final    NO YEAST OR FUNGAL ELEMENTS SEEN Performed at [facility]    Culture   Final    CULTURE IN PROGRESS FOR FOUR WEEKS Performed at [facility]    Report Status PENDING  Incomplete  CSF culture     Status: None (Preliminary result)   Collection Time: 10/30/14 11:55 PM  Result Value Ref Range Status   Specimen Description CSF  Final   Special Requests NONE  Final   Gram Stain   Final    CYTOSPIN SMEAR WBC PRESENT,BOTH PMN AND MONONUCLEAR NO ORGANISMS SEEN    Culture NO GROWTH 2 DAYS  Final   Report  Status PENDING  Incomplete  Culture, blood (routine x 2)     Status: None (Preliminary result)   Collection Time: 10/31/14  3:00 PM  Result Value Ref Range Status   Specimen Description BLOOD LEFT HAND  Final   Special Requests BOTTLES DRAWN AEROBIC ONLY 3CC  Final   Culture NO GROWTH 1 DAY  Final   Report Status PENDING  Incomplete  Culture, blood (routine x 2)     Status: None (Preliminary result)   Collection Time: 10/31/14  3:07 PM  Result Value Ref Range Status   Specimen Description BLOOD RIGHT HAND  Final   Special Requests IN PEDIATRIC BOTTLE 3CC  Final   Culture NO GROWTH 1 DAY  Final   Report Status PENDING  Incomplete  MRSA PCR Screening     Status: None   Collection Time: 10/31/14  8:05 PM  Result Value Ref Range Status   MRSA by PCR NEGATIVE NEGATIVE Final    Comment:        The GeneXpert MRSA Assay (FDA approved for NASAL specimens only), is one component of a comprehensive MRSA colonization surveillance program. It is not intended to diagnose MRSA infection nor to guide or monitor treatment for MRSA infections.     Radiology Reports Dg Chest 2 View  10/30/2014  CLINICAL DATA:  Altered mental status EXAM: CHEST  2 VIEW COMPARISON:  February 03, 2006 FINDINGS: There is no edema or consolidation. Heart is upper normal in size with pulmonary vascularity within normal limits. Patient is status post coronary artery bypass grafting. There is atherosclerotic change in the aorta. No adenopathy. No pneumothorax. No bone lesions. IMPRESSION: No edema or consolidation. Electronically Signed   By: Lowella Grip III M.D.   On: 10/30/2014 19:50   Ct Head Wo Contrast  10/30/2014  CLINICAL DATA:  81 year old male with altered mental status, found down. EXAM: CT HEAD WITHOUT CONTRAST CT CERVICAL SPINE WITHOUT CONTRAST TECHNIQUE: Multidetector CT imaging of the head and cervical spine was performed following the standard protocol without intravenous contrast. Multiplanar CT  image reconstructions of the cervical spine were also generated. COMPARISON:  None. FINDINGS: CT HEAD FINDINGS Atrophy, chronic small-vessel white matter ischemic changes and small remote bilateral cerebellar infarcts noted. No acute intracranial abnormalities are identified, including mass lesion or mass effect, hydrocephalus, extra-axial fluid collection, midline shift, hemorrhage, or acute infarction. The visualized bony calvarium is unremarkable. CT CERVICAL SPINE FINDINGS Normal alignment noted. There is no evidence of acute fracture, subluxation or prevertebral soft tissue swelling. Moderate to severe degenerative disc disease and spondylosis from C2-C6 identified contributing to moderate central spinal and bony foraminal narrowing. No focal bony lesions are identified. The soft tissue structures and lung apices are unremarkable. IMPRESSION: No evidence of acute intracranial abnormality. Atrophy, chronic small-vessel white matter ischemic changes and small remote bilateral cerebellar infarcts. No static evidence of acute injury to the cervical spine. Moderate to severe degenerative changes from C2-C6 contributing to moderate central spinal and foraminal narrowing. Electronically Signed   By: Margarette Canada M.D.   On: 10/30/2014 19:52   Ct Cervical Spine Wo Contrast  10/30/2014  CLINICAL DATA:  81 year old male with altered mental status, found down. EXAM: CT HEAD WITHOUT CONTRAST CT CERVICAL SPINE WITHOUT CONTRAST TECHNIQUE: Multidetector CT imaging of the head and cervical spine was performed following the standard protocol without intravenous contrast. Multiplanar CT image reconstructions of the cervical spine were also generated. COMPARISON:  None. FINDINGS: CT HEAD FINDINGS Atrophy, chronic small-vessel white matter ischemic changes and small remote bilateral cerebellar infarcts noted. No acute intracranial abnormalities are identified, including mass lesion or mass effect, hydrocephalus, extra-axial  fluid collection, midline shift, hemorrhage, or acute infarction. The visualized bony calvarium is unremarkable. CT CERVICAL SPINE FINDINGS Normal alignment noted. There is no evidence of acute fracture, subluxation or prevertebral soft tissue swelling. Moderate to severe degenerative disc disease and spondylosis from C2-C6 identified contributing to moderate central spinal and bony foraminal narrowing. No focal bony lesions are identified. The soft tissue structures and lung apices are unremarkable. IMPRESSION: No evidence of acute intracranial abnormality. Atrophy, chronic small-vessel white matter ischemic changes and small remote bilateral cerebellar infarcts. No static evidence of acute injury to the cervical spine. Moderate to severe degenerative changes from C2-C6 contributing to moderate central spinal and foraminal narrowing. Electronically Signed   By: Margarette Canada M.D.   On: 10/30/2014 19:52   Mr Brain Wo Contrast  10/30/2014  CLINICAL DATA:  Found down on kitchen floor, last seen normal at noon today. Aphasic, LEFT-sided gaze. History of hypertension, diabetes, prostate cancer. EXAM: MRI HEAD WITHOUT CONTRAST TECHNIQUE: Multiplanar, multiecho pulse sequences of the brain and surrounding structures were obtained without intravenous contrast. COMPARISON:  CT head October 30, 2014 at 1928 hours FINDINGS: No reduced diffusion to suggest acute ischemia. Punctate foci of susceptibility artifact LEFT cerebellum, RIGHT frontal lobe are nonspecific. O bilateral small cerebellar infarcts. Old RIGHT basal ganglia lacunar infarct. Ventricles are normal for age, mild sulcal effacement at the convexities. Patchy supratentorial white matter T2 hyperintensities without midline shift, mass effect or mass lesions. No abnormal extra-axial fluid collections. Normal major intracranial vascular flow voids seen at the skull base. Status post bilateral ocular lens implants. Small LEFT maxillary mucosal retention cyst without  paranasal sinus air-fluid levels. Old LEFT medial orbital blowout  fracture. Trace LEFT mastoid effusion. No abnormal sellar expansion. No cerebellar tonsillar ectopia. Patient is edentulous. IMPRESSION: No acute intracranial process. Involutional changes, superimposed component of suspected normal pressure hydrocephalus. Moderate white matter changes can be seen with chronic small vessel ischemic disease. 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 Electronically Signed   By: Elon Alas M.D.   On: 10/30/2014 22:32    CBC  Recent Labs Lab 10/30/14 1950 10/31/14 0429 11/01/14 0311 11/02/14 0352  WBC 12.6* 11.4* 15.4* 10.6*  HGB 15.7 15.8 14.8 14.1  HCT 46.2 46.6 44.2 42.6  PLT 208 207 185 150  MCV 90.9 91.9 92.7 93.4  MCH 30.9 31.2 31.0 30.9  MCHC 34.0 33.9 33.5 33.1  RDW 13.0 13.4 13.9 13.8  LYMPHSABS 0.8 1.0  --   --   MONOABS 0.3 0.3  --   --   EOSABS 0.0 0.0  --   --   BASOSABS 0.0 0.0  --   --     Chemistries   Recent Labs Lab 10/30/14 1950 10/31/14 0429 11/01/14 0311 11/01/14 0800 11/02/14 0352  NA 138 135 142  --  140  K 3.2* 3.2* 3.0*  --  3.1*  CL 98* 95* 105  --  107  CO2 24 22 25   --  25  GLUCOSE 254* 364* 199*  --  230*  BUN 15 13 18   --  20  CREATININE 1.31* 1.45* 1.23  --  1.16  CALCIUM 9.3 9.0 9.0  --  8.8*  MG  --   --   --  2.0  --   AST 42* 58* 54*  --   --   ALT 28 31 26   --   --   ALKPHOS 68 62 46  --   --   BILITOT 1.3* 1.8* 1.1  --   --    ------------------------------------------------------------------------------------------------------------------ estimated creatinine clearance is 57.4 mL/min (by C-G formula based on Cr of 1.16). ------------------------------------------------------------------------------------------------------------------  Recent Labs  11/01/14 1714  HGBA1C 9.7*    ------------------------------------------------------------------------------------------------------------------ No results for input(s): CHOL, HDL, LDLCALC, TRIG, CHOLHDL, LDLDIRECT in the last 72 hours. ------------------------------------------------------------------------------------------------------------------  Recent Labs  10/31/14 1226  TSH 1.004   ------------------------------------------------------------------------------------------------------------------  Recent Labs  10/31/14 1226  VITAMINB12 418    Coagulation profile  Recent Labs Lab 10/30/14 1950 10/31/14 0429  INR 1.04 1.09    No results for input(s): DDIMER in the last 72 hours.  Cardiac Enzymes  Recent Labs Lab 10/31/14 0753 10/31/14 1226 10/31/14 1830  TROPONINI 0.32* 0.39* 0.48*   ------------------------------------------------------------------------------------------------------------------ Invalid input(s): POCBNP   Recent Labs  11/01/14 0406 11/01/14 0823 11/01/14 1140 11/01/14 1647 11/01/14 2106 11/02/14 0751  GLUCAP 188* 177* 188* 244* 172* 184*     Allyanna Appleman M.D. [facility] 11/02/2014, 11:25 AM  Pager: [phone] Between 7am to 7pm - call Pager - [phone]  After 7pm go to [email or web address] - password [email or web address]  Call night coverage person covering after 7pm

## 2014-11-03 ENCOUNTER — Inpatient Hospital Stay (HOSPITAL_COMMUNITY): Payer: Medicare Other

## 2014-11-03 LAB — GLUCOSE, CAPILLARY
GLUCOSE-CAPILLARY: 117 mg/dL — AB (ref 65–99)
GLUCOSE-CAPILLARY: 207 mg/dL — AB (ref 65–99)
Glucose-Capillary: 337 mg/dL — ABNORMAL HIGH (ref 65–99)
Glucose-Capillary: 235 mg/dL — ABNORMAL HIGH (ref 65–99)

## 2014-11-03 LAB — CBC
HEMATOCRIT: 43.1 % (ref 39.0–52.0)
Hemoglobin: 14.3 g/dL (ref 13.0–17.0)
MCH: 30.5 pg (ref 26.0–34.0)
MCHC: 33.2 g/dL (ref 30.0–36.0)
MCV: 91.9 fL (ref 78.0–100.0)
PLATELETS: 164 10*3/uL (ref 150–400)
RBC: 4.69 MIL/uL (ref 4.22–5.81)
RDW: 13.7 % (ref 11.5–15.5)
WBC: 8 10*3/uL (ref 4.0–10.5)

## 2014-11-03 LAB — BASIC METABOLIC PANEL
Anion gap: 8 (ref 5–15)
BUN: 18 mg/dL (ref 6–20)
CALCIUM: 8.3 mg/dL — AB (ref 8.9–10.3)
CO2: 26 mmol/L (ref 22–32)
CREATININE: 1.1 mg/dL (ref 0.61–1.24)
Chloride: 103 mmol/L (ref 101–111)
GFR calc Af Amer: >60 mL/min (ref >60)
GFR calc non Af Amer: >60 mL/min (ref >60)
GLUCOSE: 114 mg/dL — AB (ref 65–99)
Potassium: 3.1 mmol/L — ABNORMAL LOW (ref 3.5–5.1)
Sodium: 137 mmol/L (ref 135–145)

## 2014-11-03 LAB — CSF CULTURE W GRAM STAIN: Culture: NO GROWTH

## 2014-11-03 LAB — CK: CK TOTAL: 300 U/L (ref 49–397)

## 2014-11-03 LAB — CSF CULTURE

## 2014-11-03 LAB — HEPARIN LEVEL (UNFRACTIONATED): HEPARIN UNFRACTIONATED: 0.35 [IU]/mL (ref 0.30–0.70)

## 2014-11-03 MED ORDER — PREDNISONE 20 MG PO TABS
40.0000 mg | ORAL_TABLET | Freq: Every day | ORAL | Status: AC
  Administered 2014-11-03 – 2014-11-05 (×3): 40 mg via ORAL
  Filled 2014-11-03 (×3): qty 2

## 2014-11-03 MED ORDER — POTASSIUM CHLORIDE CRYS ER 20 MEQ PO TBCR
40.0000 meq | EXTENDED_RELEASE_TABLET | Freq: Once | ORAL | Status: AC
  Administered 2014-11-03: 40 meq via ORAL
  Filled 2014-11-03: qty 2

## 2014-11-03 MED ORDER — COLCHICINE 0.6 MG PO TABS
0.6000 mg | ORAL_TABLET | Freq: Every day | ORAL | Status: DC
  Administered 2014-11-03 – 2014-11-05 (×3): 0.6 mg via ORAL
  Filled 2014-11-03 (×3): qty 1

## 2014-11-03 MED ORDER — ALLOPURINOL 300 MG PO TABS
150.0000 mg | ORAL_TABLET | Freq: Every day | ORAL | Status: DC
  Administered 2014-11-03 – 2014-11-05 (×3): 150 mg via ORAL
  Filled 2014-11-03 (×3): qty 1

## 2014-11-03 NOTE — Progress Notes (Signed)
[facility]                                                                              Patient Demographics  John Romero, is a 81 y.o. male, DOB - July 14, 1930, VZS:[id number]  Admit date - 10/30/2014   Admitting Physician Rise Patience, MD  Outpatient Primary MD for the patient is [name], DO  LOS - 3   Chief Complaint  Patient presents with  . Altered Mental Status  . Fall  . stroke symptoms        Brief HPI   Per Dr. Moise Boring admit note on 10/31/14 John Romero is a 81 y.o. male with history of CAD status post CABG, diabetes mellitus type 2, hypertension, chronic kidney disease, hyperlipidemia and gout who was doing fine until last known was found to be on the floor when patient's daughter checked on him at his house last evening around 6 PM. As per patient's daughter patient has had dinner with his friend night before and also had breakfast and was seen normal by the neighbors until 1 PM yesterday. Patient was found to be confused not moving his right side and also having left-sided preferential gaze. Patient was brought to the ER CT head and C-spine was unremarkable and MRI of the brain was done which did not show any stroke. Neurologist was consulted and patient had lumbar puncture and patient was empirically started on acyclovir and Keppra at this time. On exam patient is still encephalopathic and does not follow commands. Patient is febrile and tachycardic. As per patient's daughter patient did not have any recent travel or sick contacts and only medication changes were fluid pills. Patient's blood pressures also found to be elevated. As per family patient did not have any recent complaints including chest pain headache shortness of breath nausea vomiting diarrhea abdominal pain.    Assessment & Plan    Principal Problem:   Acute encephalopathy unclear etiology- mental status close to baseline, alert and oriented x3. Possible malignant  hypertensive encephalopathy vs PRES.   -  influenza panel negative, chest x-ray negative, UA negative for UTI  - Patient underwent lumbar puncture and was placed on empiric antibiotics for meningitis, however spinal tap negative for meningitis.  - EEG showed no seizure or seizure predisposition. ammonia level is normal - MRI of the brain showed no acute intracranial process, involutional changes superimposed component of suspected normal pressure hydrocephalus - Carotid Doppler showed 1-39% ICA stenosis right on the high end of the range, left 1-39% ICA stenosis - ID following, droplet precautions discontinued, all antibiotics and antivirals were discontinued  - Neurology also following, no further neurology workup recommended, signed off   Active Problems: New-onset atrial fibrillation with RVR - Now in normal sinus rhythm, heart rate controlled on Cardizem drip. Cardiology following, plan to DC Cardizem drip today, increase metoprolol - 2-D echo showed EF of 55-60%,  on heparin gtt, cardiology recommending NOAC  Rhabdomyolysis -Follow CKs, continue  gentle hydration  Mild elevated troponins - No complaints of any chest pain, 2-D echo with preserved EF 55-60%   mild acute on Chronic kidney disease, stage  3, Likely due to hypertensive urgency -  resolved with IV fluid hydration  Malignant hypertension 280/150 at the time of admission - BP has been improving    DM (diabetes mellitus), type 2 with renal complications (HCC)Uncontrolled -  increase Lantus, sliding scale insulin, inc meal coverage   Gout flare - Patient complaining of pain typical of his acute gout flare in his left great toe. Placed on allopurinol, colchicine and prednisone. No NSAIDs due to acute renal insufficiency  Code Status: Full CODE STATUS   Family Communication: Discussed in detail with the patient, all imaging results, lab results explained to the patient.    Disposition Plan: PT evaluation pending,  hopefully DC in 24-48 hours  Time Spent in minutes   25 minutes  Procedures  MRI brain   spinal tap  EEG  Consults   Neurology   infectious disease Cardiology  DVT Prophylaxis   SCD's  Medications  Scheduled Meds: . allopurinol  150 mg Oral Daily  . amLODipine  10 mg Oral Daily  . antiseptic oral rinse  7 mL Mouth Rinse BID  . aspirin EC  325 mg Oral Daily  . cloNIDine  0.3 mg Transdermal Weekly  . colchicine  0.6 mg Oral Daily  . feeding supplement (ENSURE ENLIVE)  237 mL Oral Q24H  . insulin aspart  0-15 Units Subcutaneous TID WC  . insulin aspart  0-5 Units Subcutaneous QHS  . insulin aspart  5 Units Subcutaneous TID WC  . insulin detemir  35 Units Subcutaneous Daily  . metoprolol tartrate  75 mg Oral BID  . predniSONE  40 mg Oral Q breakfast   Continuous Infusions: . sodium chloride Stopped (11/01/14 1300)  . heparin 1,300 Units/hr (11/03/14 0343)   PRN Meds:.sodium chloride, acetaminophen, hydrALAZINE, labetalol, metoprolol   Antibiotics   Anti-infectives    Start     Dose/Rate Route Frequency Ordered Stop   10/31/14 1200  vancomycin (VANCOCIN) IVPB 750 mg/150 ml premix  Status:  Discontinued     750 mg 150 mL/hr over 60 Minutes Intravenous Every 12 hours 10/31/14 0123 11/01/14 0758   10/31/14 0730  acyclovir (ZOVIRAX) 800 mg in dextrose 5 % 150 mL IVPB  Status:  Discontinued     800 mg 166 mL/hr over 60 Minutes Intravenous 3 times per day 10/31/14 0123 11/01/14 0758   10/31/14 0130  vancomycin (VANCOCIN) IVPB 1000 mg/200 mL premix     1,000 mg 200 mL/hr over 60 Minutes Intravenous STAT 10/31/14 0123 10/31/14 0429   10/31/14 0130  cefTRIAXone (ROCEPHIN) 2 g in dextrose 5 % 50 mL IVPB  Status:  Discontinued     2 g 100 mL/hr over 30 Minutes Intravenous Every 12 hours 10/31/14 0123 11/01/14 0758   10/31/14 0115  ampicillin (OMNIPEN) 2 g in sodium chloride 0.9 % 50 mL IVPB  Status:  Discontinued     2 g 150 mL/hr over 20 Minutes Intravenous 6 times per day  10/31/14 0114 11/01/14 0758   10/30/14 2345  acyclovir (ZOVIRAX) 800 mg in dextrose 5 % 150 mL IVPB     800 mg 166 mL/hr over 60 Minutes Intravenous  Once 10/30/14 2343 10/31/14 0207        Subjective:   John Romero was seen and examined today. Alert and oriented, denies any specific complaints today. Heart rate controlled, normal sinus rhythm. No fevers or chills, no nausea, vomiting, abdominal pain. Denies any chest pain or shortness of breath. States left great toe is painful today  Objective:   Blood pressure 146/77, pulse 77, temperature 98.7 F (37.1 C), temperature source Oral, resp. rate 18, height 5' 10.87" (1.8 m), weight 94.1 kg (207 lb 7.3 oz), SpO2 96 %.  Wt Readings from Last 3 Encounters:  10/31/14 94.1 kg (207 lb 7.3 oz)  02/19/14 94.121 kg (207 lb 8 oz)  10/17/13 94.167 kg (207 lb 9.6 oz)     Intake/Output Summary (Last 24 hours) at 11/03/14 1233 Last data filed at 11/03/14 0900  Gross per 24 hour  Intake    600 ml  Output   1100 ml  Net   -500 ml    Exam  General:Alert and oriented 3, NAD  HEENT:  PERRLA, EOMI,   Neck: Supple, no JVD, no masses  CVS:  S1S2 clear, RRR  Respiratory : CTAB  Abdomen: Soft, nontender, nondistended, + bowel sounds  Ext: no cyanosis clubbing or edema  Neuro: no new deficits  Skin: No rashes  Psych:  alert and oriented 3, normal affect   Data Review   Micro Results Recent Results (from the past 240 hour(s))  Fungus Culture with Smear     Status: None (Preliminary result)   Collection Time: 10/30/14 11:50 PM  Result Value Ref Range Status   Specimen Description CSF  Final   Special Requests NONE  Final   Fungal Smear   Final    NO YEAST OR FUNGAL ELEMENTS SEEN Performed at [facility]    Culture   Final    CULTURE IN PROGRESS FOR FOUR WEEKS Performed at [facility]    Report Status PENDING  Incomplete  CSF culture     Status: None   Collection Time: 10/30/14 11:55 PM  Result Value  Ref Range Status   Specimen Description CSF  Final   Special Requests NONE  Final   Gram Stain   Final    CYTOSPIN SMEAR WBC PRESENT,BOTH PMN AND MONONUCLEAR NO ORGANISMS SEEN    Culture NO GROWTH 3 DAYS  Final   Report Status 11/03/2014 FINAL  Final  Culture, blood (routine x 2)     Status: None (Preliminary result)   Collection Time: 10/31/14  3:00 PM  Result Value Ref Range Status   Specimen Description BLOOD LEFT HAND  Final   Special Requests BOTTLES DRAWN AEROBIC ONLY 3CC  Final   Culture NO GROWTH 2 DAYS  Final   Report Status PENDING  Incomplete  Culture, blood (routine x 2)     Status: None (Preliminary result)   Collection Time: 10/31/14  3:07 PM  Result Value Ref Range Status   Specimen Description BLOOD RIGHT HAND  Final   Special Requests IN PEDIATRIC BOTTLE 3CC  Final   Culture NO GROWTH 2 DAYS  Final   Report Status PENDING  Incomplete  MRSA PCR Screening     Status: None   Collection Time: 10/31/14  8:05 PM  Result Value Ref Range Status   MRSA by PCR NEGATIVE NEGATIVE Final    Comment:        The GeneXpert MRSA Assay (FDA approved for NASAL specimens only), is one component of a comprehensive MRSA colonization surveillance program. It is not intended to diagnose MRSA infection nor to guide or monitor treatment for MRSA infections.     Radiology Reports Dg Chest 2 View  10/30/2014  CLINICAL DATA:  Altered mental status EXAM: CHEST  2 VIEW COMPARISON:  February 03, 2006 FINDINGS: There is no edema or consolidation. Heart is upper normal  in size with pulmonary vascularity within normal limits. Patient is status post coronary artery bypass grafting. There is atherosclerotic change in the aorta. No adenopathy. No pneumothorax. No bone lesions. IMPRESSION: No edema or consolidation. Electronically Signed   By: Lowella Grip III M.D.   On: 10/30/2014 19:50   Ct Head Wo Contrast  10/30/2014  CLINICAL DATA:  81 year old male with altered mental status, found  down. EXAM: CT HEAD WITHOUT CONTRAST CT CERVICAL SPINE WITHOUT CONTRAST TECHNIQUE: Multidetector CT imaging of the head and cervical spine was performed following the standard protocol without intravenous contrast. Multiplanar CT image reconstructions of the cervical spine were also generated. COMPARISON:  None. FINDINGS: CT HEAD FINDINGS Atrophy, chronic small-vessel white matter ischemic changes and small remote bilateral cerebellar infarcts noted. No acute intracranial abnormalities are identified, including mass lesion or mass effect, hydrocephalus, extra-axial fluid collection, midline shift, hemorrhage, or acute infarction. The visualized bony calvarium is unremarkable. CT CERVICAL SPINE FINDINGS Normal alignment noted. There is no evidence of acute fracture, subluxation or prevertebral soft tissue swelling. Moderate to severe degenerative disc disease and spondylosis from C2-C6 identified contributing to moderate central spinal and bony foraminal narrowing. No focal bony lesions are identified. The soft tissue structures and lung apices are unremarkable. IMPRESSION: No evidence of acute intracranial abnormality. Atrophy, chronic small-vessel white matter ischemic changes and small remote bilateral cerebellar infarcts. No static evidence of acute injury to the cervical spine. Moderate to severe degenerative changes from C2-C6 contributing to moderate central spinal and foraminal narrowing. Electronically Signed   By: Margarette Canada M.D.   On: 10/30/2014 19:52   Ct Cervical Spine Wo Contrast  10/30/2014  CLINICAL DATA:  82 year old male with altered mental status, found down. EXAM: CT HEAD WITHOUT CONTRAST CT CERVICAL SPINE WITHOUT CONTRAST TECHNIQUE: Multidetector CT imaging of the head and cervical spine was performed following the standard protocol without intravenous contrast. Multiplanar CT image reconstructions of the cervical spine were also generated. COMPARISON:  None. FINDINGS: CT HEAD FINDINGS  Atrophy, chronic small-vessel white matter ischemic changes and small remote bilateral cerebellar infarcts noted. No acute intracranial abnormalities are identified, including mass lesion or mass effect, hydrocephalus, extra-axial fluid collection, midline shift, hemorrhage, or acute infarction. The visualized bony calvarium is unremarkable. CT CERVICAL SPINE FINDINGS Normal alignment noted. There is no evidence of acute fracture, subluxation or prevertebral soft tissue swelling. Moderate to severe degenerative disc disease and spondylosis from C2-C6 identified contributing to moderate central spinal and bony foraminal narrowing. No focal bony lesions are identified. The soft tissue structures and lung apices are unremarkable. IMPRESSION: No evidence of acute intracranial abnormality. Atrophy, chronic small-vessel white matter ischemic changes and small remote bilateral cerebellar infarcts. No static evidence of acute injury to the cervical spine. Moderate to severe degenerative changes from C2-C6 contributing to moderate central spinal and foraminal narrowing. Electronically Signed   By: Margarette Canada M.D.   On: 10/30/2014 19:52   Mr Brain Wo Contrast  10/30/2014  CLINICAL DATA:  Found down on kitchen floor, last seen normal at noon today. Aphasic, LEFT-sided gaze. History of hypertension, diabetes, prostate cancer. EXAM: MRI HEAD WITHOUT CONTRAST TECHNIQUE: Multiplanar, multiecho pulse sequences of the brain and surrounding structures were obtained without intravenous contrast. COMPARISON:  CT head October 30, 2014 at 1928 hours FINDINGS: No reduced diffusion to suggest acute ischemia. Punctate foci of susceptibility artifact LEFT cerebellum, RIGHT frontal lobe are nonspecific. O bilateral small cerebellar infarcts. Old RIGHT basal ganglia lacunar infarct. Ventricles are normal for age, mild sulcal  effacement at the convexities. Patchy supratentorial white matter T2 hyperintensities without midline shift, mass  effect or mass lesions. No abnormal extra-axial fluid collections. Normal major intracranial vascular flow voids seen at the skull base. Status post bilateral ocular lens implants. Small LEFT maxillary mucosal retention cyst without paranasal sinus air-fluid levels. Old LEFT medial orbital blowout fracture. Trace LEFT mastoid effusion. No abnormal sellar expansion. No cerebellar tonsillar ectopia. Patient is edentulous. IMPRESSION: No acute intracranial process. Involutional changes, superimposed component of suspected normal pressure hydrocephalus. Moderate white matter changes can be seen with chronic small vessel ischemic disease. 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 Electronically Signed   By: Elon Alas M.D.   On: 10/30/2014 22:32    CBC  Recent Labs Lab 10/30/14 1950 10/31/14 0429 11/01/14 0311 11/02/14 0352 11/03/14 0625  WBC 12.6* 11.4* 15.4* 10.6* 8.0  HGB 15.7 15.8 14.8 14.1 14.3  HCT 46.2 46.6 44.2 42.6 43.1  PLT 208 207 185 150 164  MCV 90.9 91.9 92.7 93.4 91.9  MCH 30.9 31.2 31.0 30.9 30.5  MCHC 34.0 33.9 33.5 33.1 33.2  RDW 13.0 13.4 13.9 13.8 13.7  LYMPHSABS 0.8 1.0  --   --   --   MONOABS 0.3 0.3  --   --   --   EOSABS 0.0 0.0  --   --   --   BASOSABS 0.0 0.0  --   --   --     Chemistries   Recent Labs Lab 10/30/14 1950 10/31/14 0429 11/01/14 0311 11/01/14 0800 11/02/14 0352 11/03/14 0625  NA 138 135 142  --  140 137  K 3.2* 3.2* 3.0*  --  3.1* 3.1*  CL 98* 95* 105  --  107 103  CO2 24 22 25   --  25 26  GLUCOSE 254* 364* 199*  --  230* 114*  BUN 15 13 18   --  20 18  CREATININE 1.31* 1.45* 1.23  --  1.16 1.10  CALCIUM 9.3 9.0 9.0  --  8.8* 8.3*  MG  --   --   --  2.0  --   --   AST 42* 58* 54*  --   --   --   ALT 28 31 26   --   --   --   ALKPHOS 68 62 46  --   --   --   BILITOT 1.3* 1.8* 1.1  --   --   --     ------------------------------------------------------------------------------------------------------------------ estimated creatinine clearance is 60.5 mL/min (by C-G formula based on Cr of 1.1). ------------------------------------------------------------------------------------------------------------------  Recent Labs  11/01/14 1714  HGBA1C 9.7*   ------------------------------------------------------------------------------------------------------------------ No results for input(s): CHOL, HDL, LDLCALC, TRIG, CHOLHDL, LDLDIRECT in the last 72 hours. ------------------------------------------------------------------------------------------------------------------ No results for input(s): TSH, T4TOTAL, T3FREE, THYROIDAB in the last 72 hours.  Invalid input(s): FREET3 ------------------------------------------------------------------------------------------------------------------ No results for input(s): VITAMINB12, FOLATE, FERRITIN, TIBC, IRON, RETICCTPCT in the last 72 hours.  Coagulation profile  Recent Labs Lab 10/30/14 1950 10/31/14 0429  INR 1.04 1.09    No results for input(s): DDIMER in the last 72 hours.  Cardiac Enzymes  Recent Labs Lab 10/31/14 0753 10/31/14 1226 10/31/14 1830  TROPONINI 0.32* 0.39* 0.48*   ------------------------------------------------------------------------------------------------------------------ Invalid input(s): POCBNP   Recent Labs  11/02/14 0751 11/02/14 1219 11/02/14 1933 11/02/14 2149 11/03/14 0757 11/03/14 [place]* 206* 134* 117* 207*     Teruko Joswick M.D. [facility] 11/03/2014, 12:33 PM  Pager: [phone] Between 7am to 7pm -  call Pager - [phone]  After 7pm go to [email or web address] - password [email or web address]  Call night coverage person covering after 7pm

## 2014-11-03 NOTE — Progress Notes (Signed)
ANTICOAGULATION CONSULT NOTE - Follow Up Consult  Pharmacy Consult for heparin Indication: atrial fibrillation  No Known Allergies  Patient Measurements: Height: 5' 10.87" (180 cm) Weight: 207 lb 7.3 oz (94.1 kg) IBW/kg (Calculated) : 74.99  Vital Signs: Temp: 98.7 F (37.1 C) (10/16 0648) Temp Source: Oral (10/16 0648) BP: 146/77 mmHg (10/16 0648) Pulse Rate: 77 (10/16 0648)  Labs:  Recent Labs  10/31/14 1226 10/31/14 1227 10/31/14 1830  11/01/14 0311  11/02/14 0352 11/02/14 1257 11/03/14 0625  HGB  --   --   --   < > 14.8  --  14.1  --  14.3  HCT  --   --   --   --  44.2  --  42.6  --  43.1  PLT  --   --   --   --  185  --  150  --  164  HEPARINUNFRC  --   --   --   --   --   < > 0.23* 0.41 0.35  CREATININE  --   --   --   --  1.23  --  1.16  --  1.10  CKTOTAL  --  1647*  --   --  1626*  --   --   --  300  TROPONINI 0.39*  --  0.48*  --   --   --   --   --   --   < > = values in this interval not displayed.  Estimated Creatinine Clearance: 60.5 mL/min (by C-G formula based on Cr of 1.1).   Assessment: 84yoM admitted 10/30/2014 w/ AMS, fall, and stroke sx, CT and MRI negative, initial concern for infectious process vs seizure. Pharmacy consulted for heparin due to new onset Afib.   HL 0.35, CBC stable wnl. No s/sx of bleeding noted. If starting PO anticoag, decrease ASA to 81mg   Goal of Therapy:  Heparin level 0.3-0.7 units/ml Monitor platelets by anticoagulation protocol: Yes   Plan:  - Contine heparin at 1300 units/hr  - If bridge to warfarin/NOAC, consider decrease ASA to 81 mg - Monitor daily HL, CBC, s/sx of bleeding  Dimitri Ped, PharmD. Clinical Pharmacist Resident Pager: [phone] 11/03/2014,8:22 AM

## 2014-11-03 NOTE — Evaluation (Signed)
Physical Therapy Evaluation Patient Details Name: John Romero MRN: [id number] DOB: 03-11-1930 Today's Date: 11/03/2014   History of Present Illness  81 yo male with onset of acute encephalopathy from Hypertensive urgency, leading to elevated troponin and new a-fib, rhabomyolysis  Clinical Impression  Pt was able to mobilize for only a short trip given his compromised tolerance with afib and will need close monitoring and follow up therapy due to level of assistance and his medical monitoring for care.    Follow Up Recommendations SNF    Equipment Recommendations  None recommended by PT    Recommendations for Other Services Rehab consult     Precautions / Restrictions Precautions Precautions: Fall (telemetry) Restrictions Weight Bearing Restrictions: No      Mobility  Bed Mobility Overal bed mobility: Needs Assistance Bed Mobility: Supine to Sit     Supine to sit: Min guard     General bed mobility comments: using bedrail and HOB elevated  Transfers Overall transfer level: Modified independent Equipment used: Rolling walker (2 wheeled);1 person hand held assist             General transfer comment: mod assist to stand with hand placement cued  Ambulation/Gait Ambulation/Gait assistance: Min assist Ambulation Distance (Feet): 7 Feet Assistive device: Rolling walker (2 wheeled);1 person hand held assist Gait Pattern/deviations: Step-through pattern;Wide base of support;Shuffle;Decreased stride length Gait velocity: reduced Gait velocity interpretation: Below normal speed for age/gender General Gait Details: pulse elevated by the effort from 96 to 125  Stairs            Wheelchair Mobility    Modified Rankin (Stroke Patients Only)       Balance Overall balance assessment: Needs assistance Sitting-balance support: Feet supported Sitting balance-Leahy Scale: Fair   Postural control: Posterior lean Standing balance support: Bilateral upper  extremity supported Standing balance-Leahy Scale: Poor                               Pertinent Vitals/Pain Pain Assessment: 0-10 Pain Score: 5  Pain Location: L great toe Pain Descriptors / Indicators: Aching Pain Intervention(s): Limited activity within patient's tolerance;Repositioned;Premedicated before session;Monitored during session    Home Living Family/patient expects to be discharged to:: Private residence Living Arrangements: Alone Available Help at Discharge: Family;Available PRN/intermittently Type of Home: House Home Access: Level entry     Home Layout: One level Home Equipment: Walker - 4 wheels;Cane - single point Additional Comments: minimal equipment as pt has been independent     Prior Function Level of Independence: Independent with assistive device(s)         Comments: recently has started using cane per daughter     Hand Dominance        Extremity/Trunk Assessment   Upper Extremity Assessment: Overall WFL for tasks assessed           Lower Extremity Assessment: Generalized weakness      Cervical / Trunk Assessment: Normal  Communication   Communication: HOH  Cognition Arousal/Alertness: Awake/alert Behavior During Therapy: WFL for tasks assessed/performed Overall Cognitive Status: Within Functional Limits for tasks assessed                      General Comments General comments (skin integrity, edema, etc.): Pt was able to walk with some sudden fatigue at 7 feet with elevated pulse, sat on chair with mod assist to control sitting    Exercises  Assessment/Plan    PT Assessment Patient needs continued PT services  PT Diagnosis Difficulty walking   PT Problem List Decreased strength;Decreased range of motion;Decreased activity tolerance;Decreased balance;Decreased mobility;Decreased coordination;Decreased cognition;Decreased knowledge of use of DME;Decreased safety awareness;Decreased knowledge of  precautions;Cardiopulmonary status limiting activity;Obesity;Pain  PT Treatment Interventions Gait training;DME instruction;Functional mobility training;Therapeutic activities;Therapeutic exercise;Balance training;Neuromuscular re-education;Patient/family education   PT Goals (Current goals can be found in the Care Plan section) Acute Rehab PT Goals Patient Stated Goal: to get stronger PT Goal Formulation: With patient/family Time For Goal Achievement: 11/17/14 Potential to Achieve Goals: Good    Frequency Min 2X/week   Barriers to discharge Decreased caregiver support;Inaccessible home environment      Co-evaluation               End of Session   Activity Tolerance: Patient tolerated treatment well;Patient limited by fatigue Patient left: in chair;with call bell/phone within reach;with chair alarm set;with nursing/sitter in room Nurse Communication: Mobility status         Time: 8502-7741 PT Time Calculation (min) (ACUTE ONLY): 26 min   Charges:   PT Evaluation $Initial PT Evaluation Tier I: 1 Procedure PT Treatments $Gait Training: 8-22 mins   PT G CodesRamond Dial 2014-11-12, 1:52 PM   Mee Hives, PT MS Acute Rehab Dept. Number: [phone] [id number] and [place] [phone]

## 2014-11-03 NOTE — Progress Notes (Signed)
Subjective: No CP  NO SOB Objective: Filed Vitals:   11/02/14 1217 11/02/14 1620 11/02/14 2151 11/03/14 0648  BP: 166/79 155/81 136/83 146/77  Pulse: 83 86 95 77  Temp: 98.1 F (36.7 C) 98.3 F (36.8 C) 98.6 F (37 C) 98.7 F (37.1 C)  TempSrc: Oral Oral Oral Oral  Resp: 18 18 18 18   Height:      Weight:      SpO2: 97% 96% 96% 96%   Weight change:   Intake/Output Summary (Last 24 hours) at 11/03/14 1238 Last data filed at 11/03/14 0900  Gross per 24 hour  Intake    600 ml  Output   1100 ml  Net   -500 ml    General: Alert, awake, oriented x3, in no acute distress Neck:  JVP is normal Heart: Regular rate and rhythm, without murmurs, rubs, gallops.  Lungs: Clear to auscultation.  No rales or wheezes. Exemities:  No edema.   Neuro: Grossly intact, nonfocal.  Tele  SR   Lab Results: Results for orders placed or performed during the hospital encounter of 10/30/14 (from the past 24 hour(s))  Heparin level (unfractionated)     Status: None   Collection Time: 11/02/14 12:57 PM  Result Value Ref Range   Heparin Unfractionated 0.41 0.30 - 0.70 IU/mL  Glucose, capillary     Status: Abnormal   Collection Time: 11/02/14  7:33 PM  Result Value Ref Range   Glucose-Capillary 206 (H) 65 - 99 mg/dL  Glucose, capillary     Status: Abnormal   Collection Time: 11/02/14  9:49 PM  Result Value Ref Range   Glucose-Capillary 134 (H) 65 - 99 mg/dL   Comment 1 Notify RN    Comment 2 Document in Chart   CBC     Status: None   Collection Time: 11/03/14  6:25 AM  Result Value Ref Range   WBC 8.0 4.0 - 10.5 K/uL   RBC 4.69 4.22 - 5.81 MIL/uL   Hemoglobin 14.3 13.0 - 17.0 g/dL   HCT 43.1 39.0 - 52.0 %   MCV 91.9 78.0 - 100.0 fL   MCH 30.5 26.0 - 34.0 pg   MCHC 33.2 30.0 - 36.0 g/dL   RDW 13.7 11.5 - 15.5 %   Platelets 164 150 - 400 K/uL  Basic metabolic panel     Status: Abnormal   Collection Time: 11/03/14  6:25 AM  Result Value Ref Range   Sodium 137 135 - 145 mmol/L   Potassium 3.1 (L) 3.5 - 5.1 mmol/L   Chloride 103 101 - 111 mmol/L   CO2 26 22 - 32 mmol/L   Glucose, Bld 114 (H) 65 - 99 mg/dL   BUN 18 6 - 20 mg/dL   Creatinine, Ser 1.10 0.61 - 1.24 mg/dL   Calcium 8.3 (L) 8.9 - 10.3 mg/dL   GFR calc non Af Amer >60 >60 mL/min   GFR calc Af Amer >60 >60 mL/min   Anion gap 8 5 - 15  Heparin level (unfractionated)     Status: None   Collection Time: 11/03/14  6:25 AM  Result Value Ref Range   Heparin Unfractionated 0.35 0.30 - 0.70 IU/mL  CK     Status: None   Collection Time: 11/03/14  6:25 AM  Result Value Ref Range   Total CK 300 49 - 397 U/L  Glucose, capillary     Status: Abnormal   Collection Time: 11/03/14  7:57 AM  Result Value Ref Range  Glucose-Capillary 117 (H) 65 - 99 mg/dL  Glucose, capillary     Status: Abnormal   Collection Time: 11/03/14 11:35 AM  Result Value Ref Range   Glucose-Capillary 207 (H) 65 - 99 mg/dL    Studies/Results: No results found.  Medications: Reviewed  A/P 1  Atrial fib  CHADSVASC2 5    Pt is back in AF  Pt on amlodipine and metoprolol.  Doing well off of dilt ggt.  Would plan on anticoagulation when neurologic status improves.  NOAC would be appropriate.  Sorin Frimpong increase metoprolol for improved rate control.  Should follow up with cardiology for further treatment of AF if he does not convert to SR.  2.  CAD  Hx of CABG  I am not convinced of active ischemia.  Follow    3.  HTN    BP is up  Follow on amlodipine and lopressor  Increase lopressor.      LOS: 3 days   Jaleyah Longhi Meredith Leeds 11/03/2014, 12:38 PM

## 2014-11-04 DIAGNOSIS — Z951 Presence of aortocoronary bypass graft: Secondary | ICD-10-CM

## 2014-11-04 LAB — BASIC METABOLIC PANEL
Anion gap: 11 (ref 5–15)
BUN: 24 mg/dL — AB (ref 6–20)
CALCIUM: 8.6 mg/dL — AB (ref 8.9–10.3)
CHLORIDE: 100 mmol/L — AB (ref 101–111)
CO2: 23 mmol/L (ref 22–32)
CREATININE: 1.29 mg/dL — AB (ref 0.61–1.24)
GFR calc Af Amer: 58 mL/min — ABNORMAL LOW (ref >60)
GFR calc non Af Amer: 50 mL/min — ABNORMAL LOW (ref >60)
Glucose, Bld: 193 mg/dL — ABNORMAL HIGH (ref 65–99)
Potassium: 3.6 mmol/L (ref 3.5–5.1)
SODIUM: 134 mmol/L — AB (ref 135–145)

## 2014-11-04 LAB — GLUCOSE, CAPILLARY
Glucose-Capillary: 153 mg/dL — ABNORMAL HIGH (ref 65–99)
Glucose-Capillary: 166 mg/dL — ABNORMAL HIGH (ref 65–99)
Glucose-Capillary: 183 mg/dL — ABNORMAL HIGH (ref 65–99)
Glucose-Capillary: 255 mg/dL — ABNORMAL HIGH (ref 65–99)

## 2014-11-04 LAB — WEST NILE AB, IGG AND IGM, CSF: West Nile Ab, IgM, CSF: <0.9 index (ref <0.90)

## 2014-11-04 LAB — CBC
HEMATOCRIT: 41.9 % (ref 39.0–52.0)
Hemoglobin: 13.9 g/dL (ref 13.0–17.0)
MCH: 30.8 pg (ref 26.0–34.0)
MCHC: 33.2 g/dL (ref 30.0–36.0)
MCV: 92.9 fL (ref 78.0–100.0)
Platelets: 172 10*3/uL (ref 150–400)
RBC: 4.51 MIL/uL (ref 4.22–5.81)
RDW: 13.7 % (ref 11.5–15.5)
WBC: 8.6 10*3/uL (ref 4.0–10.5)

## 2014-11-04 LAB — HEPARIN LEVEL (UNFRACTIONATED): HEPARIN UNFRACTIONATED: 0.54 [IU]/mL (ref 0.30–0.70)

## 2014-11-04 MED ORDER — ASPIRIN EC 81 MG PO TBEC
81.0000 mg | DELAYED_RELEASE_TABLET | Freq: Every day | ORAL | Status: DC
  Administered 2014-11-05: 81 mg via ORAL
  Filled 2014-11-04: qty 1

## 2014-11-04 NOTE — Progress Notes (Signed)
ANTICOAGULATION CONSULT NOTE - Follow Up Consult  Pharmacy Consult for Heparin Indication: atrial fibrillation  No Known Allergies  Patient Measurements: Height: 5' 10.87" (180 cm) Weight: 207 lb 7.3 oz (94.1 kg) IBW/kg (Calculated) : 74.99 Heparin Dosing Weight: 94 kg  Vital Signs: Temp: 98.8 F (37.1 C) (10/17 0524) Temp Source: Oral (10/17 0524) BP: 146/78 mmHg (10/17 0524) Pulse Rate: 66 (10/17 0524)  Labs:  Recent Labs  11/02/14 0352 11/02/14 1257 11/03/14 0625 11/04/14 0505  HGB 14.1  --  14.3 13.9  HCT 42.6  --  43.1 41.9  PLT 150  --  164 172  HEPARINUNFRC 0.23* 0.41 0.35 0.54  CREATININE 1.16  --  1.10 1.29*  CKTOTAL  --   --  300  --     Estimated Creatinine Clearance: 51.6 mL/min (by C-G formula based on Cr of 1.29).  Assessment:   Heparin level remains therapeutic (0.54) on 1300 units/hr.   Aspirin decreased from 325 to 81 mg daily.   Oral anticoagulation decision pending.  Goal of Therapy:  Heparin level 0.3-0.7 units/ml Monitor platelets by anticoagulation protocol: Yes   Plan:   Continue heparin drip at 1300 units/hr.  Daily heparin level and CBC.  Follow up oral anticoagulation plans.  Arty Baumgartner, [place] Pager: [phone] 11/04/2014,11:45 AM

## 2014-11-04 NOTE — Progress Notes (Signed)
Report given to Cassie, RN

## 2014-11-04 NOTE — Progress Notes (Signed)
SUBJECTIVE:  He is comfortable in bed at this time. His responses are slow.   Filed Vitals:   11/03/14 0648 11/03/14 1400 11/03/14 2221 11/04/14 0524  BP: 146/77 136/70 154/75 146/78  Pulse: 77 94 63 66  Temp: 98.7 F (37.1 C) 97.6 F (36.4 C) 98.5 F (36.9 C) 98.8 F (37.1 C)  TempSrc: Oral Oral Oral Oral  Resp: 18 20 18 18   Height:      Weight:      SpO2: 96% 94% 96% 95%     Intake/Output Summary (Last 24 hours) at 11/04/14 1031 Last data filed at 11/04/14 1010  Gross per 24 hour  Intake    960 ml  Output    950 ml  Net     10 ml    LABS: Basic Metabolic Panel:  Recent Labs  11/03/14 0625 11/04/14 0505  NA 137 134*  K 3.1* 3.6  CL 103 100*  CO2 26 23  GLUCOSE 114* 193*  BUN 18 24*  CREATININE 1.10 1.29*  CALCIUM 8.3* 8.6*   Liver Function Tests: No results for input(s): AST, ALT, ALKPHOS, BILITOT, PROT, ALBUMIN in the last 72 hours. No results for input(s): LIPASE, AMYLASE in the last 72 hours. CBC:  Recent Labs  11/03/14 0625 11/04/14 0505  WBC 8.0 8.6  HGB 14.3 13.9  HCT 43.1 41.9  MCV 91.9 92.9  PLT 164 172   Cardiac Enzymes:  Recent Labs  11/03/14 0625  CKTOTAL 300   BNP: Invalid input(s): POCBNP D-Dimer: No results for input(s): DDIMER in the last 72 hours. Hemoglobin A1C:  Recent Labs  11/01/14 1714  HGBA1C 9.7*   Fasting Lipid Panel: No results for input(s): CHOL, HDL, LDLCALC, TRIG, CHOLHDL, LDLDIRECT in the last 72 hours. Thyroid Function Tests: No results for input(s): TSH, T4TOTAL, T3FREE, THYROIDAB in the last 72 hours.  Invalid input(s): FREET3  RADIOLOGY: Dg Chest 2 View  10/30/2014  CLINICAL DATA:  Altered mental status EXAM: CHEST  2 VIEW COMPARISON:  February 03, 2006 FINDINGS: There is no edema or consolidation. Heart is upper normal in size with pulmonary vascularity within normal limits. Patient is status post coronary artery bypass grafting. There is atherosclerotic change in the aorta. No adenopathy. No  pneumothorax. No bone lesions. IMPRESSION: No edema or consolidation. Electronically Signed   By: Lowella Grip III M.D.   On: 10/30/2014 19:50   Ct Head Wo Contrast  10/30/2014  CLINICAL DATA:  81 year old male with altered mental status, found down. EXAM: CT HEAD WITHOUT CONTRAST CT CERVICAL SPINE WITHOUT CONTRAST TECHNIQUE: Multidetector CT imaging of the head and cervical spine was performed following the standard protocol without intravenous contrast. Multiplanar CT image reconstructions of the cervical spine were also generated. COMPARISON:  None. FINDINGS: CT HEAD FINDINGS Atrophy, chronic small-vessel white matter ischemic changes and small remote bilateral cerebellar infarcts noted. No acute intracranial abnormalities are identified, including mass lesion or mass effect, hydrocephalus, extra-axial fluid collection, midline shift, hemorrhage, or acute infarction. The visualized bony calvarium is unremarkable. CT CERVICAL SPINE FINDINGS Normal alignment noted. There is no evidence of acute fracture, subluxation or prevertebral soft tissue swelling. Moderate to severe degenerative disc disease and spondylosis from C2-C6 identified contributing to moderate central spinal and bony foraminal narrowing. No focal bony lesions are identified. The soft tissue structures and lung apices are unremarkable. IMPRESSION: No evidence of acute intracranial abnormality. Atrophy, chronic small-vessel white matter ischemic changes and small remote bilateral cerebellar infarcts. No static evidence of acute injury  to the cervical spine. Moderate to severe degenerative changes from C2-C6 contributing to moderate central spinal and foraminal narrowing. Electronically Signed   By: Margarette Canada M.D.   On: 10/30/2014 19:52   Ct Cervical Spine Wo Contrast  10/30/2014  CLINICAL DATA:  81 year old male with altered mental status, found down. EXAM: CT HEAD WITHOUT CONTRAST CT CERVICAL SPINE WITHOUT CONTRAST TECHNIQUE:  Multidetector CT imaging of the head and cervical spine was performed following the standard protocol without intravenous contrast. Multiplanar CT image reconstructions of the cervical spine were also generated. COMPARISON:  None. FINDINGS: CT HEAD FINDINGS Atrophy, chronic small-vessel white matter ischemic changes and small remote bilateral cerebellar infarcts noted. No acute intracranial abnormalities are identified, including mass lesion or mass effect, hydrocephalus, extra-axial fluid collection, midline shift, hemorrhage, or acute infarction. The visualized bony calvarium is unremarkable. CT CERVICAL SPINE FINDINGS Normal alignment noted. There is no evidence of acute fracture, subluxation or prevertebral soft tissue swelling. Moderate to severe degenerative disc disease and spondylosis from C2-C6 identified contributing to moderate central spinal and bony foraminal narrowing. No focal bony lesions are identified. The soft tissue structures and lung apices are unremarkable. IMPRESSION: No evidence of acute intracranial abnormality. Atrophy, chronic small-vessel white matter ischemic changes and small remote bilateral cerebellar infarcts. No static evidence of acute injury to the cervical spine. Moderate to severe degenerative changes from C2-C6 contributing to moderate central spinal and foraminal narrowing. Electronically Signed   By: Margarette Canada M.D.   On: 10/30/2014 19:52   Mr Brain Wo Contrast  10/30/2014  CLINICAL DATA:  Found down on kitchen floor, last seen normal at noon today. Aphasic, LEFT-sided gaze. History of hypertension, diabetes, prostate cancer. EXAM: MRI HEAD WITHOUT CONTRAST TECHNIQUE: Multiplanar, multiecho pulse sequences of the brain and surrounding structures were obtained without intravenous contrast. COMPARISON:  CT head October 30, 2014 at 1928 hours FINDINGS: No reduced diffusion to suggest acute ischemia. Punctate foci of susceptibility artifact LEFT cerebellum, RIGHT frontal  lobe are nonspecific. O bilateral small cerebellar infarcts. Old RIGHT basal ganglia lacunar infarct. Ventricles are normal for age, mild sulcal effacement at the convexities. Patchy supratentorial white matter T2 hyperintensities without midline shift, mass effect or mass lesions. No abnormal extra-axial fluid collections. Normal major intracranial vascular flow voids seen at the skull base. Status post bilateral ocular lens implants. Small LEFT maxillary mucosal retention cyst without paranasal sinus air-fluid levels. Old LEFT medial orbital blowout fracture. Trace LEFT mastoid effusion. No abnormal sellar expansion. No cerebellar tonsillar ectopia. Patient is edentulous. IMPRESSION: No acute intracranial process. Involutional changes, superimposed component of suspected normal pressure hydrocephalus. Moderate white matter changes can be seen with chronic small vessel ischemic disease. 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 Electronically Signed   By: Elon Alas M.D.   On: 10/30/2014 22:32    PHYSICAL EXAM  family members in the room. Cardiac exam reveals an S1 and S2.    TELEMETRY: I have reviewed telemetry today. There is atrial fibrillation with a controlled rate.   ASSESSMENT AND PLAN:     Acute encephalopathy   Chronic kidney disease, stage 3   Gout, chronic   Hypertensive urgency   DM (diabetes mellitus), type 2 with renal complications (HCC)   Secondary hypertension, unspecified    PAF (paroxysmal atrial fibrillation) ([facility])     He remains in atrial fibrillation today. Heparin had been added. He is on aspirin also. I have lowered his aspirin to 81 mg daily. If we convert him  to an oral anticoagulant, we will stop aspirin. He has not had an acute coronary syndrome. However mental status remains abnormal. I will not change him to an oral anticoagulant at this time.    CAD (coronary artery disease) of artery bypass graft      Coronary disease  is stable at this time.   Dola Argyle 11/04/2014 10:31 AM

## 2014-11-04 NOTE — Progress Notes (Signed)
[facility]                                                                              Patient Demographics  John Romero, is a 81 y.o. male, DOB - [birth year], MLY:[id number]  Admit date - 10/30/2014   Admitting Physician Rise Patience, MD  Outpatient Primary MD for the patient is [name], DO  LOS - 4   Chief Complaint  Patient presents with  . Altered Mental Status  . Fall  . stroke symptoms        Brief HPI   Per Dr. Moise Boring admit note on 10/31/14 John Romero is a 81 y.o. male with history of CAD status post CABG, diabetes mellitus type 2, hypertension, chronic kidney disease, hyperlipidemia and gout who was doing fine until last known was found to be on the floor when patient's daughter checked on him at his house last evening around 6 PM. As per patient's daughter, patient has had dinner with his friend night before and also had breakfast and was seen normal by the neighbors until 1 PM yesterday. Patient was found to be confused not moving his right side and also having left-sided preferential gaze. Patient was brought to the ER CT head and C-spine was unremarkable and MRI of the brain was done which did not show any stroke. Neurologist was consulted and patient had lumbar puncture and patient was empirically started on acyclovir and Keppra at this time. On exam patient is still encephalopathic and does not follow commands. Patient is febrile and tachycardic. As per patient's daughter patient did not have any recent travel or sick contacts and only medication changes were fluid pills. Patient's blood pressures also found to be elevated. As per family patient did not have any recent complaints including chest pain headache shortness of breath nausea vomiting diarrhea abdominal pain.    Assessment & Plan    Principal Problem:   Acute encephalopathy unclear etiology-significantly improved, mental status close to baseline, alert and oriented x3. 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   -  influenza panel negative, chest x-ray negative, UA negative for UTI  - Patient underwent lumbar puncture and was placed on empiric antibiotics for meningitis, however spinal tap negative for meningitis.  - EEG showed no seizure or seizure predisposition. ammonia level is normal - MRI of the brain showed no acute intracranial process, involutional changes superimposed component of suspected normal pressure hydrocephalus - Carotid Doppler showed 1-39% ICA stenosis right on the high end of the range, left 1-39% ICA stenosis - ID following, droplet precautions discontinued, all antibiotics and antivirals were discontinued  - Neurology also following, no further neurology workup recommended, signed off   Active Problems: New-onset atrial fibrillation with RVR: Heart rate controlled - Off Cardizem drip, on metoprolol - 2-D echo showed EF of 55-60%,  on heparin gtt, cardiology recommending NOAC versus aspirin  Rhabdomyolysis -CK is improving  Mild elevated troponins - No complaints of any chest pain, 2-D echo with preserved EF 55-60%   mild acute on Chronic kidney disease, stage 3, Likely due to hypertensive urgency -  resolved with IV  fluid hydration  Malignant hypertension 280/150 at the time of admission - BP has been improving    DM (diabetes mellitus), type 2 with renal complications (HCC)Uncontrolled -  increase Lantus, sliding scale insulin, inc meal coverage   Gout flare symptoms improving:  - Patient complaining of pain typical of his acute gout flare in his left great toe. Placed on allopurinol, colchicine and prednisone. No NSAIDs due to acute renal insufficiency  Code Status: Full CODE STATUS   Family Communication: Discussed in detail with the patient, all imaging results, lab results explained to the patient And daughter at the bedside.    Disposition Plan: Hopefully DC tomorrow   Time Spent in minutes   25  minutes  Procedures  MRI brain   spinal tap  EEG  Consults   Neurology   infectious disease Cardiology  DVT Prophylaxis   SCD's  Medications  Scheduled Meds: . allopurinol  150 mg Oral Daily  . amLODipine  10 mg Oral Daily  . antiseptic oral rinse  7 mL Mouth Rinse BID  . [START ON 11/05/2014] aspirin EC  81 mg Oral Daily  . cloNIDine  0.3 mg Transdermal Weekly  . colchicine  0.6 mg Oral Daily  . feeding supplement (ENSURE ENLIVE)  237 mL Oral Q24H  . insulin aspart  0-15 Units Subcutaneous TID WC  . insulin aspart  0-5 Units Subcutaneous QHS  . insulin aspart  5 Units Subcutaneous TID WC  . insulin detemir  35 Units Subcutaneous Daily  . metoprolol tartrate  75 mg Oral BID  . predniSONE  40 mg Oral Q breakfast   Continuous Infusions: . sodium chloride Stopped (11/01/14 1300)  . heparin 1,300 Units/hr (11/04/14 0027)   PRN Meds:.sodium chloride, acetaminophen, hydrALAZINE, labetalol, metoprolol   Antibiotics   Anti-infectives    Start     Dose/Rate Route Frequency Ordered Stop   10/31/14 1200  vancomycin (VANCOCIN) IVPB 750 mg/150 ml premix  Status:  Discontinued     750 mg 150 mL/hr over 60 Minutes Intravenous Every 12 hours 10/31/14 0123 11/01/14 0758   10/31/14 0730  acyclovir (ZOVIRAX) 800 mg in dextrose 5 % 150 mL IVPB  Status:  Discontinued     800 mg 166 mL/hr over 60 Minutes Intravenous 3 times per day 10/31/14 0123 11/01/14 0758   10/31/14 0130  vancomycin (VANCOCIN) IVPB 1000 mg/200 mL premix     1,000 mg 200 mL/hr over 60 Minutes Intravenous STAT 10/31/14 0123 10/31/14 0429   10/31/14 0130  cefTRIAXone (ROCEPHIN) 2 g in dextrose 5 % 50 mL IVPB  Status:  Discontinued     2 g 100 mL/hr over 30 Minutes Intravenous Every 12 hours 10/31/14 0123 11/01/14 0758   10/31/14 0115  ampicillin (OMNIPEN) 2 g in sodium chloride 0.9 % 50 mL IVPB  Status:  Discontinued     2 g 150 mL/hr over 20 Minutes Intravenous 6 times per day 10/31/14 0114 11/01/14 0758    10/30/14 2345  acyclovir (ZOVIRAX) 800 mg in dextrose 5 % 150 mL IVPB     800 mg 166 mL/hr over 60 Minutes Intravenous  Once 10/30/14 2343 10/31/14 0207        Subjective:   Decoda Van was seen and examined today. alert and oriented, mental status close to baseline as confirmed with the daughter at the bedside. Denies any nausea, vomiting, abdominal pain. No fevers or chills.  Denies any chest pain or shortness of breath. States left great toe is painful today  Objective:   Blood pressure 141/79, pulse 75, temperature 98.8 F (37.1 C), temperature source Oral, resp. rate 18, height 5' 10.87" (1.8 m), weight 94.1 kg (207 lb 7.3 oz), SpO2 93 %.  Wt Readings from Last 3 Encounters:  10/31/14 94.1 kg (207 lb 7.3 oz)  02/19/14 94.121 kg (207 lb 8 oz)  10/17/13 94.167 kg (207 lb 9.6 oz)     Intake/Output Summary (Last 24 hours) at 11/04/14 1527 Last data filed at 11/04/14 1347  Gross per 24 hour  Intake    840 ml  Output    600 ml  Net    240 ml    Exam  General:Alert and oriented 3, NAD  HEENT:  PERRLA, EOMI,   Neck: Supple, no JVD  CVS:  S1S2 clear, RRR  Respiratory : CTAB  Abdomen: Soft, nontender, nondistended, + bowel sounds  Ext: no cyanosis clubbing or edema  Neuro: no new deficits  Skin: No rashes  Psych:  alert and oriented 3, normal affect   Data Review   Micro Results Recent Results (from the past 240 hour(s))  Fungus Culture with Smear     Status: None (Preliminary result)   Collection Time: 10/30/14 11:50 PM  Result Value Ref Range Status   Specimen Description CSF  Final   Special Requests NONE  Final   Fungal Smear   Final    NO YEAST OR FUNGAL ELEMENTS SEEN Performed at [facility]    Culture   Final    CULTURE IN PROGRESS FOR FOUR WEEKS Performed at [facility]    Report Status PENDING  Incomplete  CSF culture     Status: None   Collection Time: 10/30/14 11:55 PM  Result Value Ref Range Status   Specimen  Description CSF  Final   Special Requests NONE  Final   Gram Stain   Final    CYTOSPIN SMEAR WBC PRESENT,BOTH PMN AND MONONUCLEAR NO ORGANISMS SEEN    Culture NO GROWTH 3 DAYS  Final   Report Status 11/03/2014 FINAL  Final  Culture, blood (routine x 2)     Status: None (Preliminary result)   Collection Time: 10/31/14  3:00 PM  Result Value Ref Range Status   Specimen Description BLOOD LEFT HAND  Final   Special Requests BOTTLES DRAWN AEROBIC ONLY 3CC  Final   Culture NO GROWTH 4 DAYS  Final   Report Status PENDING  Incomplete  Culture, blood (routine x 2)     Status: None (Preliminary result)   Collection Time: 10/31/14  3:07 PM  Result Value Ref Range Status   Specimen Description BLOOD RIGHT HAND  Final   Special Requests IN PEDIATRIC BOTTLE 3CC  Final   Culture NO GROWTH 4 DAYS  Final   Report Status PENDING  Incomplete  MRSA PCR Screening     Status: None   Collection Time: 10/31/14  8:05 PM  Result Value Ref Range Status   MRSA by PCR NEGATIVE NEGATIVE Final    Comment:        The GeneXpert MRSA Assay (FDA approved for NASAL specimens only), is one component of a comprehensive MRSA colonization surveillance program. It is not intended to diagnose MRSA infection nor to guide or monitor treatment for MRSA infections.     Radiology Reports Dg Chest 2 View  10/30/2014  CLINICAL DATA:  Altered mental status EXAM: CHEST  2 VIEW COMPARISON:  February 03, 2006 FINDINGS: There is no edema or consolidation. Heart is upper normal  in size with pulmonary vascularity within normal limits. Patient is status post coronary artery bypass grafting. There is atherosclerotic change in the aorta. No adenopathy. No pneumothorax. No bone lesions. IMPRESSION: No edema or consolidation. Electronically Signed   By: Lowella Grip III M.D.   On: 10/30/2014 19:50   Ct Head Wo Contrast  10/30/2014  CLINICAL DATA:  81 year old male with altered mental status, found down. EXAM: CT HEAD WITHOUT  CONTRAST CT CERVICAL SPINE WITHOUT CONTRAST TECHNIQUE: Multidetector CT imaging of the head and cervical spine was performed following the standard protocol without intravenous contrast. Multiplanar CT image reconstructions of the cervical spine were also generated. COMPARISON:  None. FINDINGS: CT HEAD FINDINGS Atrophy, chronic small-vessel white matter ischemic changes and small remote bilateral cerebellar infarcts noted. No acute intracranial abnormalities are identified, including mass lesion or mass effect, hydrocephalus, extra-axial fluid collection, midline shift, hemorrhage, or acute infarction. The visualized bony calvarium is unremarkable. CT CERVICAL SPINE FINDINGS Normal alignment noted. There is no evidence of acute fracture, subluxation or prevertebral soft tissue swelling. Moderate to severe degenerative disc disease and spondylosis from C2-C6 identified contributing to moderate central spinal and bony foraminal narrowing. No focal bony lesions are identified. The soft tissue structures and lung apices are unremarkable. IMPRESSION: No evidence of acute intracranial abnormality. Atrophy, chronic small-vessel white matter ischemic changes and small remote bilateral cerebellar infarcts. No static evidence of acute injury to the cervical spine. Moderate to severe degenerative changes from C2-C6 contributing to moderate central spinal and foraminal narrowing. Electronically Signed   By: Margarette Canada M.D.   On: 10/30/2014 19:52   Ct Cervical Spine Wo Contrast  10/30/2014  CLINICAL DATA:  81 year old male with altered mental status, found down. EXAM: CT HEAD WITHOUT CONTRAST CT CERVICAL SPINE WITHOUT CONTRAST TECHNIQUE: Multidetector CT imaging of the head and cervical spine was performed following the standard protocol without intravenous contrast. Multiplanar CT image reconstructions of the cervical spine were also generated. COMPARISON:  None. FINDINGS: CT HEAD FINDINGS Atrophy, chronic small-vessel  white matter ischemic changes and small remote bilateral cerebellar infarcts noted. No acute intracranial abnormalities are identified, including mass lesion or mass effect, hydrocephalus, extra-axial fluid collection, midline shift, hemorrhage, or acute infarction. The visualized bony calvarium is unremarkable. CT CERVICAL SPINE FINDINGS Normal alignment noted. There is no evidence of acute fracture, subluxation or prevertebral soft tissue swelling. Moderate to severe degenerative disc disease and spondylosis from C2-C6 identified contributing to moderate central spinal and bony foraminal narrowing. No focal bony lesions are identified. The soft tissue structures and lung apices are unremarkable. IMPRESSION: No evidence of acute intracranial abnormality. Atrophy, chronic small-vessel white matter ischemic changes and small remote bilateral cerebellar infarcts. No static evidence of acute injury to the cervical spine. Moderate to severe degenerative changes from C2-C6 contributing to moderate central spinal and foraminal narrowing. Electronically Signed   By: Margarette Canada M.D.   On: 10/30/2014 19:52   Mr Brain Wo Contrast  10/30/2014  CLINICAL DATA:  Found down on kitchen floor, last seen normal at noon today. Aphasic, LEFT-sided gaze. History of hypertension, diabetes, prostate cancer. EXAM: MRI HEAD WITHOUT CONTRAST TECHNIQUE: Multiplanar, multiecho pulse sequences of the brain and surrounding structures were obtained without intravenous contrast. COMPARISON:  CT head October 30, 2014 at 1928 hours FINDINGS: No reduced diffusion to suggest acute ischemia. Punctate foci of susceptibility artifact LEFT cerebellum, RIGHT frontal lobe are nonspecific. O bilateral small cerebellar infarcts. Old RIGHT basal ganglia lacunar infarct. Ventricles are normal for age, mild sulcal  effacement at the convexities. Patchy supratentorial white matter T2 hyperintensities without midline shift, mass effect or mass lesions. No  abnormal extra-axial fluid collections. Normal major intracranial vascular flow voids seen at the skull base. Status post bilateral ocular lens implants. Small LEFT maxillary mucosal retention cyst without paranasal sinus air-fluid levels. Old LEFT medial orbital blowout fracture. Trace LEFT mastoid effusion. No abnormal sellar expansion. No cerebellar tonsillar ectopia. Patient is edentulous. IMPRESSION: No acute intracranial process. Involutional changes, superimposed component of suspected normal pressure hydrocephalus. Moderate white matter changes can be seen with chronic small vessel ischemic disease. 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 Electronically Signed   By: Elon Alas M.D.   On: 10/30/2014 22:32    CBC  Recent Labs Lab 10/30/14 1950 10/31/14 0429 11/01/14 0311 11/02/14 0352 11/03/14 0625 11/04/14 0505  WBC 12.6* 11.4* 15.4* 10.6* 8.0 8.6  HGB 15.7 15.8 14.8 14.1 14.3 13.9  HCT 46.2 46.6 44.2 42.6 43.1 41.9  PLT 208 207 185 150 164 172  MCV 90.9 91.9 92.7 93.4 91.9 92.9  MCH 30.9 31.2 31.0 30.9 30.5 30.8  MCHC 34.0 33.9 33.5 33.1 33.2 33.2  RDW 13.0 13.4 13.9 13.8 13.7 13.7  LYMPHSABS 0.8 1.0  --   --   --   --   MONOABS 0.3 0.3  --   --   --   --   EOSABS 0.0 0.0  --   --   --   --   BASOSABS 0.0 0.0  --   --   --   --     Chemistries   Recent Labs Lab 10/30/14 1950 10/31/14 0429 11/01/14 0311 11/01/14 0800 11/02/14 0352 11/03/14 0625 11/04/14 0505  NA 138 135 142  --  140 137 134*  K 3.2* 3.2* 3.0*  --  3.1* 3.1* 3.6  CL 98* 95* 105  --  107 103 100*  CO2 24 22 25   --  25 26 23   GLUCOSE 254* 364* 199*  --  230* 114* 193*  BUN 15 13 18   --  20 18 24*  CREATININE 1.31* 1.45* 1.23  --  1.16 1.10 1.29*  CALCIUM 9.3 9.0 9.0  --  8.8* 8.3* 8.6*  MG  --   --   --  2.0  --   --   --   AST 42* 58* 54*  --   --   --   --   ALT 28 31 26   --   --   --   --   ALKPHOS 68 62 46  --   --   --   --   BILITOT 1.3* 1.8* 1.1   --   --   --   --    ------------------------------------------------------------------------------------------------------------------ estimated creatinine clearance is 51.6 mL/min (by C-G formula based on Cr of 1.29). ------------------------------------------------------------------------------------------------------------------  Recent Labs  11/01/14 1714  HGBA1C 9.7*   ------------------------------------------------------------------------------------------------------------------ No results for input(s): CHOL, HDL, LDLCALC, TRIG, CHOLHDL, LDLDIRECT in the last 72 hours. ------------------------------------------------------------------------------------------------------------------ No results for input(s): TSH, T4TOTAL, T3FREE, THYROIDAB in the last 72 hours.  Invalid input(s): FREET3 ------------------------------------------------------------------------------------------------------------------ No results for input(s): VITAMINB12, FOLATE, FERRITIN, TIBC, IRON, RETICCTPCT in the last 72 hours.  Coagulation profile  Recent Labs Lab 10/30/14 1950 10/31/14 0429  INR 1.04 1.09    No results for input(s): DDIMER in the last 72 hours.  Cardiac Enzymes  Recent Labs Lab 10/31/14 0753 10/31/14 1226 10/31/14 1830  TROPONINI 0.32* 0.39* 0.48*   ------------------------------------------------------------------------------------------------------------------  Invalid input(s): [place]  11/03/14 0757 11/03/14 1135 11/03/14 1645 11/03/14 2220 11/04/14 0738 11/04/14 1155  GLUCAP 117* 207* 337* 235* 153* 166*     RAI,RIPUDEEP M.D. [facility] 11/04/2014, 3:27 PM  Pager: [phone] Between 7am to 7pm - call Pager - [phone]  After 7pm go to [email or web address] - password [email or web address]  Call night coverage person covering after 7pm

## 2014-11-04 NOTE — Care Management Important Message (Signed)
Important Message  Patient Details  Name: John Romero MRN: [id number] Date of Birth: 03-08-1930   Medicare Important Message Given:  Yes-second notification given    [name] May 11/04/2014, 12:08 PM

## 2014-11-04 NOTE — Care Management Note (Signed)
Case Management Note  Patient Details  Name: John Romero MRN: [id number] Date of Birth: 12-06-1930  Subjective/Objective:       Date: 11/04/14 Spoke with patient at the bedside along with daughter,[name] hansen [phone]. Introduced self as Tourist information centre manager and explained role in discharge planning and how to be reached. Verified patient lives in town, alone , has DME cane and a rolling walker.. Expressed no potential need for no other DME. Verified patient anticipates to go  SNF at time of discharge and will have  part-time supervision by family at this time to best of their knowledge. Patient  denied needing help with their medication. Patient drives  to MD appointments. Verified patient has PCP [facility]. NCM spoke with patient and daughter, patient has decided to go to snf at dc.  CSW is aware and will give patient bed offiers.  NCM gave daughter home health agency list and private duty [facility] also.  Plan: CM will continue to follow for discharge planning and [facility] resources.              Action/Plan:   Expected Discharge Date:                  Expected Discharge Plan:  Skilled Nursing Facility  In-House Referral:  Clinical Social Work  Discharge planning Services  CM Consult  Post Acute Care Choice:    Choice offered to:     DME Arranged:    DME Agency:     HH Arranged:    [place] Agency:     Status of Service:  In process, will continue to follow  Medicare Important Message Given:  Yes-second notification given Date Medicare IM Given:    Medicare IM give by:    Date Additional Medicare IM Given:    Additional Medicare Important Message give by:     If discussed at [place] of Stay Meetings, dates discussed:    Additional Comments:  Zenon Mayo, RN 11/04/2014, 4:45 PM

## 2014-11-04 NOTE — Evaluation (Addendum)
Occupational Therapy Evaluation Patient Details Name: John Romero MRN: [id number] DOB: 08/06/1930 Today's Date: 11/04/2014    History of Present Illness 81 y.o. male with history of CAD status post CABG, diabetes mellitus type 2, hypertension, chronic kidney disease, hyperlipidemia and gout. Pt admitted with acute encephalopathy from Hypertensive urgency, leading to elevated troponin and new a-fib. MRI negative for acute infarct.   Clinical Impression   Pt admitted with above. Pt independent with ADLs, PTA. Feel pt will benefit from acute OT to increase independence and activity tolerance prior to d/c. Recommending HHOT upon d/c.    Follow Up Recommendations  Home health OT;Supervision/Assistance - 24 hour    Equipment Recommendations  3 in 1 bedside comode;Tub/shower bench    Recommendations for Other Services       Precautions / Restrictions Precautions Precautions: Fall Restrictions Weight Bearing Restrictions: No      Mobility Bed Mobility Overal bed mobility: Needs Assistance Bed Mobility: Supine to Sit;Sit to Supine     Supine to sit: Supervision Sit to supine: Supervision (however assist to scoot HOB)      Transfers Overall transfer level: Needs assistance Equipment used: Rolling walker (2 wheeled) Transfers: Sit to/from Stand Sit to Stand: Min guard              Balance  LOB when standing and pulling up LB clothing. History of falls.                                           ADL Overall ADL's : Needs assistance/impaired                     Lower Body Dressing: Minimal assistance;Sit to/from stand   Toilet Transfer: Minimal assistance;Ambulation;RW (sit to stand from bed)   Toileting- Clothing Manipulation and Hygiene: Minimal assistance;Sit to/from stand       Functional mobility during ADLs: Minimal assistance;Rolling walker General ADL Comments: Educated on deep breathing technique and energy conservation.  Explained tub transfer technique and options for shower chair.     Vision     Perception     Praxis      Pertinent Vitals/Pain Pain Assessment: No/denies pain; HR up to 126 in session (AFIB)     Hand Dominance     Extremity/Trunk Assessment Upper Extremity Assessment Upper Extremity Assessment: Overall WFL for tasks assessed   Lower Extremity Assessment Lower Extremity Assessment: Defer to PT evaluation       Communication Communication Communication: HOH   Cognition Arousal/Alertness: Awake/alert Behavior During Therapy: WFL for tasks assessed/performed Overall Cognitive Status: Seemed to have slow processing                      General Comments       Exercises       Shoulder Instructions      Home Living Family/patient expects to be discharged to:: Private residence Living Arrangements: Alone Available Help at Discharge: Family (daughter trying to work out to stay with pt) Type of Home: House Home Access: Level entry     Home Layout: One level     Bathroom Shower/Tub: Tub/shower unit;Door   [employer] Toilet: Standard     Home Equipment: Environmental consultant - 4 wheels;Cane - single point          Prior Functioning/Environment Level of Independence: Independent with assistive device(s)  Comments: recently has started using cane per daughter    OT Diagnosis: Generalized weakness   OT Problem List: Decreased strength;Decreased cognition;Decreased knowledge of use of DME or AE;Decreased knowledge of precautions;Decreased activity tolerance;Impaired balance (sitting and/or standing)   OT Treatment/Interventions: Self-care/ADL training;Therapeutic exercise;Energy conservation;DME and/or AE instruction;Therapeutic activities;Cognitive remediation/compensation;Patient/family education;Balance training    OT Goals(Current goals can be found in the care plan section) Acute Rehab OT Goals Patient Stated Goal: not stated OT Goal Formulation: With  patient Time For Goal Achievement: 11/11/14 Potential to Achieve Goals: Good ADL Goals Pt Will Perform Lower Body Bathing: with set-up;with supervision;sit to/from stand Pt Will Perform Lower Body Dressing: with set-up;with supervision;sit to/from stand Pt Will Transfer to Toilet: with supervision;ambulating (3 in 1 over commode) Pt Will Perform Toileting - Clothing Manipulation and hygiene: with supervision;sit to/from stand  OT Frequency: Min 2X/week   Barriers to D/C:            Co-evaluation              End of Session Equipment Utilized During Treatment: Gait belt;Rolling walker Nurse Communication: Other (comment) (HR up to 126)  Activity Tolerance: Patient tolerated treatment well Patient left: in bed;with call bell/phone within reach;with bed alarm set;with family/visitor present   Time: 4174-0814 OT Time Calculation (min): 24 min Charges:  OT General Charges $OT Visit: 1 Procedure OT Evaluation $Initial OT Evaluation Tier I: 1 Procedure G-CodesBenito Mccreedy OTR/L [id number] 11/04/2014, 11:28 AM

## 2014-11-05 DIAGNOSIS — E784 Other hyperlipidemia: Secondary | ICD-10-CM | POA: Diagnosis not present

## 2014-11-05 DIAGNOSIS — R600 Localized edema: Secondary | ICD-10-CM | POA: Diagnosis not present

## 2014-11-05 DIAGNOSIS — I480 Paroxysmal atrial fibrillation: Secondary | ICD-10-CM | POA: Diagnosis not present

## 2014-11-05 DIAGNOSIS — M153 Secondary multiple arthritis: Secondary | ICD-10-CM | POA: Diagnosis not present

## 2014-11-05 DIAGNOSIS — Z8546 Personal history of malignant neoplasm of prostate: Secondary | ICD-10-CM | POA: Diagnosis not present

## 2014-11-05 DIAGNOSIS — E1129 Type 2 diabetes mellitus with other diabetic kidney complication: Secondary | ICD-10-CM | POA: Diagnosis not present

## 2014-11-05 DIAGNOSIS — G9340 Encephalopathy, unspecified: Secondary | ICD-10-CM | POA: Diagnosis not present

## 2014-11-05 DIAGNOSIS — E119 Type 2 diabetes mellitus without complications: Secondary | ICD-10-CM | POA: Diagnosis not present

## 2014-11-05 DIAGNOSIS — G8190 Hemiplegia, unspecified affecting unspecified side: Secondary | ICD-10-CM | POA: Diagnosis not present

## 2014-11-05 DIAGNOSIS — N189 Chronic kidney disease, unspecified: Secondary | ICD-10-CM | POA: Diagnosis not present

## 2014-11-05 DIAGNOSIS — R7989 Other specified abnormal findings of blood chemistry: Secondary | ICD-10-CM | POA: Diagnosis not present

## 2014-11-05 DIAGNOSIS — E1121 Type 2 diabetes mellitus with diabetic nephropathy: Secondary | ICD-10-CM | POA: Diagnosis not present

## 2014-11-05 DIAGNOSIS — I25810 Atherosclerosis of coronary artery bypass graft(s) without angina pectoris: Secondary | ICD-10-CM | POA: Diagnosis not present

## 2014-11-05 DIAGNOSIS — G9349 Other encephalopathy: Secondary | ICD-10-CM | POA: Diagnosis not present

## 2014-11-05 DIAGNOSIS — R2681 Unsteadiness on feet: Secondary | ICD-10-CM | POA: Diagnosis not present

## 2014-11-05 DIAGNOSIS — I10 Essential (primary) hypertension: Secondary | ICD-10-CM | POA: Diagnosis not present

## 2014-11-05 DIAGNOSIS — I2510 Atherosclerotic heart disease of native coronary artery without angina pectoris: Secondary | ICD-10-CM | POA: Diagnosis not present

## 2014-11-05 DIAGNOSIS — I4891 Unspecified atrial fibrillation: Secondary | ICD-10-CM | POA: Diagnosis not present

## 2014-11-05 DIAGNOSIS — M1A9XX0 Chronic gout, unspecified, without tophus (tophi): Secondary | ICD-10-CM | POA: Diagnosis not present

## 2014-11-05 DIAGNOSIS — N183 Chronic kidney disease, stage 3 (moderate): Secondary | ICD-10-CM | POA: Diagnosis not present

## 2014-11-05 DIAGNOSIS — M6282 Rhabdomyolysis: Secondary | ICD-10-CM | POA: Diagnosis not present

## 2014-11-05 DIAGNOSIS — M6281 Muscle weakness (generalized): Secondary | ICD-10-CM | POA: Diagnosis not present

## 2014-11-05 LAB — CULTURE, BLOOD (ROUTINE X 2)
Culture: NO GROWTH
Culture: NO GROWTH

## 2014-11-05 LAB — BASIC METABOLIC PANEL
ANION GAP: 10 (ref 5–15)
BUN: 23 mg/dL — ABNORMAL HIGH (ref 6–20)
CO2: 24 mmol/L (ref 22–32)
Calcium: 9 mg/dL (ref 8.9–10.3)
Chloride: 103 mmol/L (ref 101–111)
Creatinine, Ser: 1.21 mg/dL (ref 0.61–1.24)
GFR calc non Af Amer: 54 mL/min — ABNORMAL LOW (ref >60)
GLUCOSE: 201 mg/dL — AB (ref 65–99)
POTASSIUM: 3.7 mmol/L (ref 3.5–5.1)
Sodium: 137 mmol/L (ref 135–145)

## 2014-11-05 LAB — CBC
HCT: 42.4 % (ref 39.0–52.0)
Hemoglobin: 14.1 g/dL (ref 13.0–17.0)
MCH: 30.9 pg (ref 26.0–34.0)
MCHC: 33.3 g/dL (ref 30.0–36.0)
MCV: 93 fL (ref 78.0–100.0)
Platelets: 197 10*3/uL (ref 150–400)
RBC: 4.56 MIL/uL (ref 4.22–5.81)
RDW: 13.8 % (ref 11.5–15.5)
WBC: 9.7 10*3/uL (ref 4.0–10.5)

## 2014-11-05 LAB — GLUCOSE, CAPILLARY
GLUCOSE-CAPILLARY: 165 mg/dL — AB (ref 65–99)
GLUCOSE-CAPILLARY: 192 mg/dL — AB (ref 65–99)
Glucose-Capillary: 163 mg/dL — ABNORMAL HIGH (ref 65–99)
Glucose-Capillary: 211 mg/dL — ABNORMAL HIGH (ref 65–99)

## 2014-11-05 LAB — HEPARIN LEVEL (UNFRACTIONATED): Heparin Unfractionated: 0.61 IU/mL (ref 0.30–0.70)

## 2014-11-05 MED ORDER — COLCHICINE 0.6 MG PO TABS: 0.6000 mg | ORAL_TABLET | Freq: Every day | ORAL | Status: DC

## 2014-11-05 MED ORDER — ASPIRIN 81 MG PO TBEC: 81.0000 mg | DELAYED_RELEASE_TABLET | Freq: Every day | ORAL | Status: DC

## 2014-11-05 MED ORDER — ALLOPURINOL 300 MG PO TABS: ORAL_TABLET | ORAL | Status: DC

## 2014-11-05 MED ORDER — FUROSEMIDE 40 MG PO TABS: 20.0000 mg | ORAL_TABLET | Freq: Every day | ORAL | Status: DC

## 2014-11-05 MED ORDER — ALLOPURINOL 300 MG PO TABS: ORAL_TABLET | ORAL | Status: AC

## 2014-11-05 MED ORDER — INSULIN DETEMIR 100 UNIT/ML FLEXPEN: 35.0000 [IU] | PEN_INJECTOR | Freq: Every day | SUBCUTANEOUS | Status: DC

## 2014-11-05 MED ORDER — ENSURE ENLIVE PO LIQD: 237.0000 mL | ORAL | Status: DC

## 2014-11-05 MED ORDER — CLONIDINE HCL 0.2 MG PO TABS: 0.3000 mg | ORAL_TABLET | Freq: Two times a day (BID) | ORAL | Status: DC

## 2014-11-05 MED ORDER — INSULIN ASPART 100 UNIT/ML FLEXPEN: 5.0000 [IU] | PEN_INJECTOR | Freq: Three times a day (TID) | SUBCUTANEOUS | Status: DC

## 2014-11-05 MED ORDER — METOPROLOL TARTRATE 75 MG PO TABS: 75.0000 mg | ORAL_TABLET | Freq: Two times a day (BID) | ORAL | Status: DC

## 2014-11-05 MED ORDER — INSULIN ASPART 100 UNIT/ML ~~LOC~~ SOLN: 0.0000 [IU] | Freq: Three times a day (TID) | SUBCUTANEOUS | Status: DC

## 2014-11-05 MED ORDER — PREDNISONE 10 MG PO TABS: ORAL_TABLET | ORAL | Status: DC

## 2014-11-05 NOTE — Progress Notes (Signed)
Speech Language Pathology Treatment: Dysphagia  Patient Details Name: John Romero MRN: [id number] DOB: Nov 11, 1930 Today's Date: 11/05/2014 Time: 1510-1520 SLP Time Calculation (min) (ACUTE ONLY): 10 min  Assessment / Plan / Recommendation Clinical Impression  F/u for dysphagia: Pt much more alert since time of initial swallow evaluation.  Dysphagia has resolved - he is able to masticate regular solids well; there is a brisk swallow response; there are no s/s of aspiration.  Pt preparing for D/C today to SNF.  He will not need SLP f/u there.  D/W pt and his wife.    HPI Other Pertinent Information: 81 y.o. male with history of CAD status post CABG, diabetes mellitus type 2, MI, hypertension, chronic kidney disease, hyperlipidemia and gout admitted with confusion, not moving right side and left-sided gage preference. MRI No acute intracranial process. 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 MD suspect he had hypertensive emergency/encephalopathy. CXR no edema or consolidation.   Pertinent Vitals Pain Assessment: No/denies pain  SLP Plan  All goals met    Recommendations Diet recommendations: Regular;Thin liquid Liquids provided via: Cup;Straw Medication Administration: Whole meds with liquid Supervision: Patient able to self feed              Oral Care Recommendations: Oral care BID Follow up Recommendations: None Plan: All goals met    GO     John Romero 11/05/2014, 3:21 PM

## 2014-11-05 NOTE — Progress Notes (Signed)
Patient Name:  John Romero, DOB: [birth year], MRN: [id number] Primary Doctor: [name], DO Primary Cardiologist:   Gwenlyn Found  Date: 11/05/2014     Past Medical History  Diagnosis Date  . Type II or unspecified type diabetes mellitus without mention of complication, uncontrolled   . Other and unspecified hyperlipidemia   . Gout, unspecified   . Unspecified essential hypertension   . Type II or unspecified type diabetes mellitus with renal manifestations, uncontrolled   . 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 Gout, unspecified   . Type II or unspecified type diabetes mellitus without mention of complication, uncontrolled   . First degree atrioventricular block   . Malignant neoplasm of prostate ([place])   . Malignant neoplasm of prostate ([place])   . Type II or unspecified type diabetes mellitus without mention of complication, not stated as uncontrolled   . Other and unspecified hyperlipidemia   . Unspecified tinnitus   . Unspecified essential hypertension   . Old myocardial infarction   . Coronary atherosclerosis of unspecified type of vessel, native or graft   . Edema   . Undiagnosed cardiac murmurs   . Undiagnosed cardiac murmurs    Filed Vitals:   11/04/14 2019 11/04/14 2230 11/05/14 0002 11/05/14 0414  BP: 142/70 124/60 135/63 158/89  Pulse: 74 62 70 66  Temp: 97.6 F (36.4 C)  98.6 F (37 C) 98.2 F (36.8 C)  TempSrc: Oral  Oral Oral  Resp: 18  16 16   Height:      Weight:      SpO2: 96%  96% 94%    Intake/Output Summary (Last 24 hours) at 11/05/14 0928 Last data filed at 11/05/14 0830  Gross per 24 hour  Intake    480 ml  Output    850 ml  Net   -370 ml   Filed Weights   10/31/14 0100  Weight: 207 lb 7.3 oz (94.1 kg)     LABS: Basic Metabolic Panel:  Recent Labs  11/04/14 0505 11/05/14 0528  NA 134* 137  K 3.6 3.7  CL 100* 103  CO2 23 24  GLUCOSE 193* 201*  BUN 24* 23*  CREATININE 1.29* 1.21  CALCIUM 8.6*  9.0   Liver Function Tests: No results for input(s): AST, ALT, ALKPHOS, BILITOT, PROT, ALBUMIN in the last 72 hours. No results for input(s): LIPASE, AMYLASE in the last 72 hours. CBC:  Recent Labs  11/04/14 0505 11/05/14 0528  WBC 8.6 9.7  HGB 13.9 14.1  HCT 41.9 42.4  MCV 92.9 93.0  PLT 172 197   Cardiac Enzymes:  Recent Labs  11/03/14 0625  CKTOTAL 300   BNP: Invalid input(s): POCBNP D-Dimer: No results for input(s): DDIMER in the last 72 hours. Thyroid Function Tests: No results for input(s): TSH, T4TOTAL, T3FREE, THYROIDAB in the last 72 hours.  Invalid input(s): FREET3  RADIOLOGY: Dg Chest 2 View  10/30/2014  CLINICAL DATA:  Altered mental status EXAM: CHEST  2 VIEW COMPARISON:  February 03, 2006 FINDINGS: There is no edema or consolidation. Heart is upper normal in size with pulmonary vascularity within normal limits. Patient is status post coronary artery bypass grafting. There is atherosclerotic change in the aorta. No adenopathy. No pneumothorax. No bone lesions. IMPRESSION: No edema or consolidation. Electronically Signed   By: Lowella Grip III M.D.   On: 10/30/2014 19:50   Ct Head Wo Contrast  10/30/2014  CLINICAL DATA:  81 year old male with altered mental  status, found down. EXAM: CT HEAD WITHOUT CONTRAST CT CERVICAL SPINE WITHOUT CONTRAST TECHNIQUE: Multidetector CT imaging of the head and cervical spine was performed following the standard protocol without intravenous contrast. Multiplanar CT image reconstructions of the cervical spine were also generated. COMPARISON:  None. FINDINGS: CT HEAD FINDINGS Atrophy, chronic small-vessel white matter ischemic changes and small remote bilateral cerebellar infarcts noted. No acute intracranial abnormalities are identified, including mass lesion or mass effect, hydrocephalus, extra-axial fluid collection, midline shift, hemorrhage, or acute infarction. The visualized bony calvarium is unremarkable. CT CERVICAL SPINE  FINDINGS Normal alignment noted. There is no evidence of acute fracture, subluxation or prevertebral soft tissue swelling. Moderate to severe degenerative disc disease and spondylosis from C2-C6 identified contributing to moderate central spinal and bony foraminal narrowing. No focal bony lesions are identified. The soft tissue structures and lung apices are unremarkable. IMPRESSION: No evidence of acute intracranial abnormality. Atrophy, chronic small-vessel white matter ischemic changes and small remote bilateral cerebellar infarcts. No static evidence of acute injury to the cervical spine. Moderate to severe degenerative changes from C2-C6 contributing to moderate central spinal and foraminal narrowing. Electronically Signed   By: Margarette Canada M.D.   On: 10/30/2014 19:52   Ct Cervical Spine Wo Contrast  10/30/2014  CLINICAL DATA:  81 year old male with altered mental status, found down. EXAM: CT HEAD WITHOUT CONTRAST CT CERVICAL SPINE WITHOUT CONTRAST TECHNIQUE: Multidetector CT imaging of the head and cervical spine was performed following the standard protocol without intravenous contrast. Multiplanar CT image reconstructions of the cervical spine were also generated. COMPARISON:  None. FINDINGS: CT HEAD FINDINGS Atrophy, chronic small-vessel white matter ischemic changes and small remote bilateral cerebellar infarcts noted. No acute intracranial abnormalities are identified, including mass lesion or mass effect, hydrocephalus, extra-axial fluid collection, midline shift, hemorrhage, or acute infarction. The visualized bony calvarium is unremarkable. CT CERVICAL SPINE FINDINGS Normal alignment noted. There is no evidence of acute fracture, subluxation or prevertebral soft tissue swelling. Moderate to severe degenerative disc disease and spondylosis from C2-C6 identified contributing to moderate central spinal and bony foraminal narrowing. No focal bony lesions are identified. The soft tissue structures and  lung apices are unremarkable. IMPRESSION: No evidence of acute intracranial abnormality. Atrophy, chronic small-vessel white matter ischemic changes and small remote bilateral cerebellar infarcts. No static evidence of acute injury to the cervical spine. Moderate to severe degenerative changes from C2-C6 contributing to moderate central spinal and foraminal narrowing. Electronically Signed   By: Margarette Canada M.D.   On: 10/30/2014 19:52   Mr Brain Wo Contrast  10/30/2014  CLINICAL DATA:  Found down on kitchen floor, last seen normal at noon today. Aphasic, LEFT-sided gaze. History of hypertension, diabetes, prostate cancer. EXAM: MRI HEAD WITHOUT CONTRAST TECHNIQUE: Multiplanar, multiecho pulse sequences of the brain and surrounding structures were obtained without intravenous contrast. COMPARISON:  CT head October 30, 2014 at 1928 hours FINDINGS: No reduced diffusion to suggest acute ischemia. Punctate foci of susceptibility artifact LEFT cerebellum, RIGHT frontal lobe are nonspecific. O bilateral small cerebellar infarcts. Old RIGHT basal ganglia lacunar infarct. Ventricles are normal for age, mild sulcal effacement at the convexities. Patchy supratentorial white matter T2 hyperintensities without midline shift, mass effect or mass lesions. No abnormal extra-axial fluid collections. Normal major intracranial vascular flow voids seen at the skull base. Status post bilateral ocular lens implants. Small LEFT maxillary mucosal retention cyst without paranasal sinus air-fluid levels. Old LEFT medial orbital blowout fracture. Trace LEFT mastoid effusion. No abnormal sellar expansion. No cerebellar  tonsillar ectopia. Patient is edentulous. IMPRESSION: No acute intracranial process. Involutional changes, superimposed component of suspected normal pressure hydrocephalus. Moderate white matter changes can be seen with chronic small vessel ischemic disease. Old RIGHT basal ganglia lacunar infarct, small bilateral  cerebellar infarcts. Electronically Signed   By: Elon Alas M.D.   On: 10/30/2014 22:32     TELEMETRY:   I have reviewed telemetry today November 05, 2014. Atrial fibrillation continues with a controlled rate.   ASSESSMENT AND PLAN:    Acute encephalopathy     Mental status is improving    PAF (paroxysmal atrial fibrillation) ([place])     The patient is now on aspirin 81 mg daily. It is felt that using an anticoagulant would be appropriate when we are sure his mental status is completely stable. I am hesitant to start this today. We will make plans for the patient to be seen in the atrial fibrillation clinic after discharge from the hospital. At that time his overall status can be assessed and decision made as to whether or not anticoagulation is appropriate.    CAD (coronary artery disease) of artery bypass graft     Coronary disease is stable at this time.   Dola Argyle 11/05/2014 9:28 AM

## 2014-11-05 NOTE — Clinical Social Work Placement (Signed)
   CLINICAL SOCIAL WORK PLACEMENT  NOTE  Date:  11/05/2014  Patient Details  Name: John Romero MRN: [id number] Date of Birth: 18-Aug-1930  Clinical Social Work is seeking post-discharge placement for this patient at the [place] level of care (*CSW will initial, date and re-position this form in  chart as items are completed):  Yes   Patient/family provided with [place] Work Department's list of facilities offering this level of care within the geographic area requested by the patient (or if unable, by the patient's family).  Yes   Patient/family informed of their freedom to choose among providers that offer the needed level of care, that participate in Medicare, Medicaid or managed care program needed by the patient, have an available bed and are willing to accept the patient.  Yes   Patient/family informed of [facility]'s ownership interest in [facility] and [facility], as well as of the fact that they are under no obligation to receive care at these facilities.  PASRR submitted to EDS on 11/04/14     PASRR number received on 11/04/14     Existing PASRR number confirmed on       FL2 transmitted to all facilities in geographic area requested by pt/family on 11/04/14     FL2 transmitted to all facilities within larger geographic area on       Patient informed that his/her managed care company has contracts with or will negotiate with certain facilities, including the following:        Yes   Patient/family informed of bed offers received.  Patient chooses bed at  ([facility])     Physician recommends and patient chooses bed at      Patient to be transferred to  ([facility]) on 11/05/14.  Patient to be transferred to facility by  (Pt's dtr, would like to transport patient )     Patient family notified on 11/05/14 of transfer.  Name of family member notified:   (Pt's dtr, [name] )      [place] Please sign FL2     Additional Comment:    _______________________________________________ Glendon Axe, MSW, LCSWA [phone] 11/05/2014 12:47 PM

## 2014-11-05 NOTE — Clinical Social Work Note (Signed)
Assigned CSW has completed full psychosocial assessment.   Patient has a bed at [facility], [facility] and [facility]. FL-2 on chart for MD signature.  CSW remains available as needed.  Glendon Axe, MSW, LCSWA [phone] 11/05/2014 11:14 AM

## 2014-11-05 NOTE — Clinical Social Work Note (Signed)
Clinical Social Worker facilitated patient discharge including contacting patient family and facility to confirm patient discharge plans.  Clinical information faxed to facility and family agreeable with plan. Patient's daughter to transport patient to [facility].  RN to call report prior to discharge.  DC packet prepared and on chart for transport with number for report.   Clinical Social Worker will sign off for now as social work intervention is no longer needed. Please consult [place] again if new need arises.  Glendon Axe, MSW, LCSWA [phone] 11/05/2014 12:48 PM

## 2014-11-05 NOTE — Consult Note (Signed)
   [facility] CM Inpatient Consult   11/05/2014  John Romero May 06, 1930 [id number] Referral received. Patient evaluated for community based chronic disease management services with [facility] as a benefit of patient's [employer]. Spoke with patient, daughter, [name] [[phone], cell] and his friend, at bedside to explain [facility] services. Patient endorses that Dr. [name] as his primary care provider.  Consent form signed, folder with [facility] given.   Patient will receive post discharge follow up at the skilled nursing facility for short term rehab, currently at [facility] and will be evaluated for monthly home visits for assessments and disease process education.  Made Inpatient Case Manager aware that [facility] following. Of note, [facility] Care Management services does not replace or interfere with any services that are arranged by inpatient case management or social work.  For additional questions or referrals please contact:  Natividad Brood, RN BSN [facility] Liaison  [phone] business mobile phone

## 2014-11-05 NOTE — Clinical Social Work Note (Signed)
Clinical Social Worker notified by bedside RN that patient will need [facility] transportation.  CSW arranged ambulance transport via [facility] to [place].  RN to call report prior to discharge.  DC packet completed and on chart.   Clinical Social Worker will sign off for now as social work intervention is no longer needed. Please consult [place] again if new need arises.  Glendon Axe, MSW, LCSWA [phone] 11/05/2014 4:08 PM

## 2014-11-05 NOTE — Discharge Summary (Signed)
Physician Discharge Summary   Patient ID: John Romero MRN: [id number] DOB/AGE: 03-01-1930 81 y.o.  Admit date: 10/30/2014 Discharge date: 11/05/2014  Primary Care Physician:  [name], DO  Discharge Diagnoses:    . Acute encephalopathy . Hypertensive urgency . Atrial fibrillation with RVR  . Chronic kidney disease, stage 3 . DM (diabetes mellitus), type 2 with renal complications ([place]) . 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 Elevated troponin  Consults: Cardiology, Dr. Ron Parker   Recommendations for Outpatient Follow-up:  Outpatient follow-up in the atrial fibrillation clinic arranged   continue aspirin 325 mg daily, cardiology to decide outpatient at the follow-up appointment whether candidate for permanent anticoagulation    TESTS THAT NEED FOLLOW-UP CBC, BMET   DIET: *carb modify diet    Allergies:  No Known Allergies   Discharge Medications:   Medication List    STOP taking these medications        hydrochlorothiazide 50 MG tablet  Commonly known as:  HYDRODIURIL     potassium chloride SA 20 MEQ tablet  Commonly known as:  K-DUR,KLOR-CON     ramipril 10 MG capsule  Commonly known as:  ALTACE      TAKE these medications        allopurinol 300 MG tablet  Commonly known as:  ZYLOPRIM  TAKE 1/2 TABLET DAILY TO PREVENT GOUT ATTACK     amLODipine 10 MG tablet  Commonly known as:  NORVASC  TAKE 1 TABLET DAILY FOR BLOOD PRESSURE     aspirin 325 MG EC tablet  Take 325 mg by mouth daily.     cloNIDine 0.3 MG tablet  Commonly known as:  CATAPRES  Take one tablet by mouth twice daily for blood pressure     colchicine 0.6 MG tablet  Take 1 tablet (0.6 mg total) by mouth daily. X 2 days, then as needed to prevent gout attack     feeding supplement (ENSURE ENLIVE) Liqd  Take 237 mLs by mouth daily.     furosemide 40 MG tablet  Commonly known as:  LASIX  Take 0.5 tablets (20 mg total) by mouth daily.     insulin aspart 100 UNIT/ML injection   Commonly known as:  novoLOG  Inject 0-15 Units into the skin 3 (three) times daily with meals. Sliding scale  CBG 70 - 120: 0 units: CBG 121 - 150: 2 units; CBG 151 - 200: 3 units; CBG 201 - 250: 5 units; CBG 251 - 300: 8 units;CBG 301 - 350: 11 units; CBG 351 - 400: 15 units; CBG > 400 : 15 units and notify MD     insulin aspart 100 UNIT/ML FlexPen  Commonly known as:  NOVOLOG FLEXPEN  Inject 5 Units into the skin 3 (three) times daily with meals.     Insulin Detemir 100 UNIT/ML Pen  Commonly known as:  LEVEMIR FLEXPEN  Inject 35 Units into the skin daily at 10 pm. Inject 38 units once daily to control blood sugar *Please send 90 day supply*     Insulin Pen Needle 31G X 8 MM Misc  Commonly known as:  B-D ULTRAFINE III SHORT PEN  Use 2 pen needles daily with the administration of Insulin. DX 250.00     Metoprolol Tartrate 75 MG Tabs  Take 75 mg by mouth 2 (two) times daily.     multivitamin tablet  Take 1 tablet by mouth daily.     predniSONE 10 MG tablet  Commonly known as:  DELTASONE  Prednisone  dosing: Take  Prednisone 30mg  (3 tabs) x 2 days, then 20mg  (2 tabs) x 2days, then 10mg  (1 tab) x 2days, then OFF.     simvastatin 20 MG tablet  Commonly known as:  ZOCOR  APPOINTMENT OVERDUE, 1 by mouth daily to lower cholesterol         Brief H and P: For complete details please refer to admission H and P, but in brief  Per Dr. Moise Boring admit note on 10/31/14 John Romero is a 81 y.o. male with history of CAD status post CABG, diabetes mellitus type 2, hypertension, chronic kidney disease, hyperlipidemia and gout who was doing fine until last known was found to be on the floor when patient's daughter checked on him at his house last evening around 6 PM. As per patient's daughter, patient has had dinner with his friend night before and also had breakfast and was seen normal by the neighbors until 1 PM yesterday. Patient was found to be confused not moving his right side and also  having left-sided preferential gaze. Patient was brought to the ER CT head and C-spine was unremarkable and MRI of the brain was done which did not show any stroke. Neurologist was consulted and patient had lumbar puncture and patient was empirically started on acyclovir and Keppra at this time. On exam patient is still encephalopathic and does not follow commands. Patient is febrile and tachycardic. As per patient's daughter patient did not have any recent travel or sick contacts and only medication changes were fluid pills. Patient's blood pressures also found to be elevated. As per family patient did not have any recent complaints including chest pain headache shortness of breath nausea vomiting diarrhea abdominal pain.   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 Patient presented with BP of 280/150 at the time of admission. - Patient had presented almost obtunded at the time of admission. Influenza panel negative, chest x-ray negative, UA negative for UTI  - Patient underwent lumbar puncture and was placed on empiric antibiotics for meningitis, however spinal tap negative for meningitis.  - EEG showed no seizure or seizure predisposition. ammonia level is normal - MRI of the brain showed no acute intracranial process, involutional changes superimposed component of suspected normal pressure hydrocephalus - Carotid Doppler showed 1-39% ICA stenosis right on the high end of the range, left 1-39% ICA stenosis -Infectious disease consult was obtained and did not strongly feel after 48 hours the patient had any encephalitis or meningitis. Antibiotics and antivirals were all discontinued. - Patient continued to have progressive improvement in his mental status with improvement in his BP. No further neurology workup is recommended by  neurology service.  New-onset atrial fibrillation with RVR: Heart rate controlled The patient was initially placed on Cardizem drip, he was transitioned to oral metoprolol by cardiology service. 2-D echo showed EF of 55-60%. He was placed on IV heparin drip. Cardiology recommended to continue one aspirin daily, he will be followed up office and decision at that time will be made for anticoagulation, cardiology appointment has been arranged.  Rhabdomyolysis -CK is improving  Mild elevated troponins - No complaints of any chest pain, 2-D echo with preserved EF 55-60%  mild acute on Chronic kidney disease, stage 3, Likely due to hypertensive urgency - resolved with IV fluid hydration  Malignant hypertension 280/150 at the time of admission - BP has been improving,  continue current antihypertensives.   DM (diabetes mellitus), type 2 with renal complications (HCC)Uncontrolled - increase Lantus, sliding scale insulin, inc meal coverage   Acute on chronic Gout flare symptoms improving:  - Patient complaining of pain typical of his acute gout flare in his left great toe. Placed on allopurinol, colchicine and prednisone. No NSAIDs due to acute renal insufficiency. His symptoms have significantly improved.  Generalized debility PT evaluation recommended skilled nursing facility, bed available    Day of Discharge BP 153/75 mmHg  Pulse 63  Temp(Src) 98.8 F (37.1 C) (Oral)  Resp 20  Ht 5' 10.87" (1.8 m)  Wt 94.1 kg (207 lb 7.3 oz)  BMI 29.04 kg/m2  SpO2 94%  Physical Exam: General: Alert and awake oriented x3 not in any acute distress. HEENT: anicteric sclera, pupils reactive to light and accommodation CVS: S1-S2 clear no murmur rubs or gallops Chest: clear to auscultation bilaterally, no wheezing rales or rhonchi Abdomen: soft nontender, nondistended, normal bowel sounds Extremities: no cyanosis, clubbing or edema noted bilaterally Neuro: Cranial nerves II-XII intact, no  focal neurological deficits   The results of significant diagnostics from this hospitalization (including imaging, microbiology, ancillary and laboratory) are listed below for reference.    LAB RESULTS: Basic Metabolic Panel:  Recent Labs Lab 11/01/14 0800  11/04/14 0505 11/05/14 0528  NA  --   < > 134* 137  K  --   < > 3.6 3.7  CL  --   < > 100* 103  CO2  --   < > 23 24  GLUCOSE  --   < > 193* 201*  BUN  --   < > 24* 23*  CREATININE  --   < > 1.29* 1.21  CALCIUM  --   < > 8.6* 9.0  MG 2.0  --   --   --   < > = values in this interval not displayed. Liver Function Tests:  Recent Labs Lab 10/31/14 0429 11/01/14 0311  AST 58* 54*  ALT 31 26  ALKPHOS 62 46  BILITOT 1.8* 1.1  PROT 7.0 6.4*  ALBUMIN 4.0 3.5   No results for input(s): LIPASE, AMYLASE in the last 168 hours.  Recent Labs Lab 10/31/14 0344  AMMONIA 20   CBC:  Recent Labs Lab 10/31/14 0429  11/04/14 0505 11/05/14 0528  WBC 11.4*  < > 8.6 9.7  NEUTROABS 10.1*  --   --   --   HGB 15.8  < > 13.9 14.1  HCT 46.6  < > 41.9 42.4  MCV 91.9  < > 92.9 93.0  PLT 207  < > 172 197  < > = values in this interval not displayed. Cardiac Enzymes:  Recent Labs Lab 10/31/14 1226  10/31/14 1830 11/01/14 0311 11/03/14 0625  CKTOTAL  --   < >  --  1626* 300  TROPONINI 0.39*  --  0.48*  --   --   < > = values in this interval not displayed. BNP: Invalid input(s): POCBNP CBG:  Recent Labs Lab 11/05/14 0930 11/05/14 1149  GLUCAP 165* 192*    Significant Diagnostic Studies:  Dg Chest 2 View  10/30/2014  CLINICAL DATA:  Altered mental status EXAM: CHEST  2 VIEW COMPARISON:  February 03, 2006 FINDINGS: There is no edema or consolidation. Heart is upper normal in size with pulmonary vascularity within normal limits. Patient is status post coronary artery bypass grafting. There is atherosclerotic change in the aorta. No adenopathy. No pneumothorax. No bone  lesions. IMPRESSION: No edema or consolidation.  Electronically Signed   By: Lowella Grip III M.D.   On: 10/30/2014 19:50   Ct Head Wo Contrast  10/30/2014  CLINICAL DATA:  81 year old male with altered mental status, found down. EXAM: CT HEAD WITHOUT CONTRAST CT CERVICAL SPINE WITHOUT CONTRAST TECHNIQUE: Multidetector CT imaging of the head and cervical spine was performed following the standard protocol without intravenous contrast. Multiplanar CT image reconstructions of the cervical spine were also generated. COMPARISON:  None. FINDINGS: CT HEAD FINDINGS Atrophy, chronic small-vessel white matter ischemic changes and small remote bilateral cerebellar infarcts noted. No acute intracranial abnormalities are identified, including mass lesion or mass effect, hydrocephalus, extra-axial fluid collection, midline shift, hemorrhage, or acute infarction. The visualized bony calvarium is unremarkable. CT CERVICAL SPINE FINDINGS Normal alignment noted. There is no evidence of acute fracture, subluxation or prevertebral soft tissue swelling. Moderate to severe degenerative disc disease and spondylosis from C2-C6 identified contributing to moderate central spinal and bony foraminal narrowing. No focal bony lesions are identified. The soft tissue structures and lung apices are unremarkable. IMPRESSION: No evidence of acute intracranial abnormality. Atrophy, chronic small-vessel white matter ischemic changes and small remote bilateral cerebellar infarcts. No static evidence of acute injury to the cervical spine. Moderate to severe degenerative changes from C2-C6 contributing to moderate central spinal and foraminal narrowing. Electronically Signed   By: Margarette Canada M.D.   On: 10/30/2014 19:52   Ct Cervical Spine Wo Contrast  10/30/2014  CLINICAL DATA:  81 year old male with altered mental status, found down. EXAM: CT HEAD WITHOUT CONTRAST CT CERVICAL SPINE WITHOUT CONTRAST TECHNIQUE: Multidetector CT imaging of the head and cervical spine was performed following  the standard protocol without intravenous contrast. Multiplanar CT image reconstructions of the cervical spine were also generated. COMPARISON:  None. FINDINGS: CT HEAD FINDINGS Atrophy, chronic small-vessel white matter ischemic changes and small remote bilateral cerebellar infarcts noted. No acute intracranial abnormalities are identified, including mass lesion or mass effect, hydrocephalus, extra-axial fluid collection, midline shift, hemorrhage, or acute infarction. The visualized bony calvarium is unremarkable. CT CERVICAL SPINE FINDINGS Normal alignment noted. There is no evidence of acute fracture, subluxation or prevertebral soft tissue swelling. Moderate to severe degenerative disc disease and spondylosis from C2-C6 identified contributing to moderate central spinal and bony foraminal narrowing. No focal bony lesions are identified. The soft tissue structures and lung apices are unremarkable. IMPRESSION: No evidence of acute intracranial abnormality. Atrophy, chronic small-vessel white matter ischemic changes and small remote bilateral cerebellar infarcts. No static evidence of acute injury to the cervical spine. Moderate to severe degenerative changes from C2-C6 contributing to moderate central spinal and foraminal narrowing. Electronically Signed   By: Margarette Canada M.D.   On: 10/30/2014 19:52   Mr Brain Wo Contrast  10/30/2014  CLINICAL DATA:  Found down on kitchen floor, last seen normal at noon today. Aphasic, LEFT-sided gaze. History of hypertension, diabetes, prostate cancer. EXAM: MRI HEAD WITHOUT CONTRAST TECHNIQUE: Multiplanar, multiecho pulse sequences of the brain and surrounding structures were obtained without intravenous contrast. COMPARISON:  CT head October 30, 2014 at 1928 hours FINDINGS: No reduced diffusion to suggest acute ischemia. Punctate foci of susceptibility artifact LEFT cerebellum, RIGHT frontal lobe are nonspecific. O bilateral small cerebellar infarcts. Old RIGHT basal  ganglia lacunar infarct. Ventricles are normal for age, mild sulcal effacement at the convexities. Patchy supratentorial white matter T2 hyperintensities without midline shift, mass effect or mass lesions. No abnormal extra-axial fluid collections. Normal major intracranial vascular flow  voids seen at the skull base. Status post bilateral ocular lens implants. Small LEFT maxillary mucosal retention cyst without paranasal sinus air-fluid levels. Old LEFT medial orbital blowout fracture. Trace LEFT mastoid effusion. No abnormal sellar expansion. No cerebellar tonsillar ectopia. Patient is edentulous. IMPRESSION: No acute intracranial process. Involutional changes, superimposed component of suspected normal pressure hydrocephalus. Moderate white matter changes can be seen with chronic small vessel ischemic disease. 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 Electronically Signed   By: Elon Alas M.D.   On: 10/30/2014 22:32    2D ECHO: Study Conclusions  - Left ventricle: There is hypokinesis of the basal inferior and inferolateral walls. Overall LVEF is normal. The cavity size was normal. There was severe focal basal hypertrophy of the septum. Systolic function was normal. The estimated ejection fraction was in the range of 55% to 60%. The study was not technically sufficient to allow evaluation of LV diastolic dysfunction due to atrial fibrillation. - Aortic valve: There was mild regurgitation. - Mitral valve: Calcified annulus. Mildly thickened leaflets . - Left atrium: The atrium was moderately dilated. - Right ventricle: The cavity size was normal. Wall thickness was normal. Systolic function was normal. - Right atrium: The atrium was normal in size. - Tricuspid valve: There was no regurgitation. - Pulmonary arteries: Systolic pressure was within the normal range. - Inferior vena cava: The vessel was normal in size. - Pericardium,  extracardiac: There was no pericardial effusion.  Disposition and Follow-up: Discharge Instructions    (HEART FAILURE PATIENTS) Call MD:  Anytime you have any of the following symptoms: 1) 3 pound weight gain in 24 hours or 5 pounds in 1 week 2) shortness of breath, with or without a dry hacking cough 3) swelling in the hands, feet or stomach 4) if you have to sleep on extra pillows at night in order to breathe.    Complete by:  As directed      AMB Referral to [place] Management    Complete by:  As directed   Reason for consult:  Post rehab facility follow up; lives alone -  Diagnoses of:  Diabetes  Expected date of contact:  1-3 days (reserved for hospital discharges)  Please assign to [place] worker to follow up at rehab facility for discharge planning for his home and  nurse for transition of care calls and assess for home visits.  Can also contact his daughter [name], [phone] per consent Questions please call:  Natividad Brood, RN BSN [facility] Liaison  [phone] business mobile phone     Diet Carb Modified    Complete by:  As directed      Increase activity slowly    Complete by:  As directed             DISPOSITION: SNF   DISCHARGE FOLLOW-UP Follow-up Information    Follow up with CARROLL,DONNA, NP On 11/26/2014.   Specialties:  Nurse Practitioner, Cardiology   Why:  at 11am, for hospital follow-up, Afib clinic    Contact information:   [place] [address] [phone]       Follow up with Dola Argyle, MD. Schedule an appointment as soon as possible for a visit in 2 weeks.   Specialty:  Cardiology   Why:  for hospital follow-up   Contact information:   [address] [place] [address] [phone]       Follow up with [name]  Marcello Moores, MD. Schedule an appointment as soon as possible for a visit in 2 weeks.   Specialty:  Internal Medicine   Why:  for hospital follow-up    Contact information:   [address] [place] [address] [place] [address] [phone]        Time spent on Discharge: 35 mins   Signed:   RAI,RIPUDEEP M.D. [facility] 11/05/2014, 12:22 PM Pager: [phone]

## 2014-11-06 NOTE — Patient Outreach (Signed)
[facility]) Care Management  11/06/2014  John Romero Aug 25, 1930 [id number]   Referral from Natividad Brood, RN to assign SW and [employer], assigned Eula Fried, LCSW to follow while inpatient at [facility].  [name], RN to follow upon discharged from [facility].  Thanks, [name]. [place], [place] Assistant Phone: [phone] Fax: [phone]

## 2014-11-06 NOTE — Clinical Social Work Note (Signed)
Clinical Social Work Assessment  Patient Details  Name: John Romero MRN: [id number] Date of Birth: 01-14-1931  Date of referral:  11/04/14               Reason for consult:  Facility Placement                Permission sought to share information with:  Family Supports Permission granted to share information::  Yes, Verbal Permission Granted  Name::     [name], patient's daughter  Agency::  SNF admissions  Relationship::     Contact Information:     Housing/Transportation Living arrangements for the past 2 months:  Single Family Home Source of Information:  Patient, Adult Children Patient Interpreter Needed:  None Criminal Activity/Legal Involvement Pertinent to Current Situation/Hospitalization:  No - Comment as needed Significant Relationships:  Adult Children Lives with:  Self Do you feel safe going back to the place where you live?  Yes (Patient feels he needs some short term rehab before he can return home) Need for family participation in patient care:  Yes (Comment) (Patient requests that his daughter help make decisions)  Care giving concerns: Patient and daughter agree to receiving some short term rehab before returning back home.   Social Worker assessment / plan:  Patient is a 81 year old male who lives by himself.  Patient is alert and oriented x4, but quiet.  Patient stated he would like to return back home with home health, but will consider going to SNF for short term rehab.  Patient's daughter was at bedside and was asking questions about Medicare and SNF placement.  Patient's daughter expressed that she is planning on staying with patient until he has gotten his strength up.  Patient was explained the differences between SNF therapy and home health therapy.  Patient agreed to go to SNF for short term rehab.  Patient's daughter is in agreement to having patient go to SNF as well.  Patient and daughter did not express any other concerns or issues.  Patient and  daughter gave CSW permission to begin bed search for SNF.   Employment status:  Retired Forensic scientist:  Medicare PT Recommendations:  [place] / Referral to community resources:  [place]  Patient/Family's Response to care:  Patient and family agreeable to SNF placement for short term rehab.  Patient/Family's Understanding of and Emotional Response to Diagnosis, Current Treatment, and Prognosis:  Patient and daughter are aware of current treatment and prognosis.  Emotional Assessment Appearance:  Appears stated age Attitude/Demeanor/Rapport:    Affect (typically observed):  Pleasant, Quiet, Appropriate Orientation:  Oriented to Self, Oriented to Place, Oriented to  Time, Oriented to Situation Alcohol / Substance use:  Not Applicable Psych involvement (Current and /or in the community):  No (Comment)  Discharge Needs  Concerns to be addressed:  Lack of Support Readmission within the last 30 days:  No Current discharge risk:  Lives alone Barriers to Discharge:  No Barriers Identified   Ross Ludwig, LCSWA 11/06/2014, 7:50 AM

## 2014-11-07 ENCOUNTER — Other Ambulatory Visit: Payer: Self-pay | Admitting: Licensed Clinical Social Worker

## 2014-11-07 DIAGNOSIS — I4891 Unspecified atrial fibrillation: Secondary | ICD-10-CM | POA: Diagnosis not present

## 2014-11-07 DIAGNOSIS — I10 Essential (primary) hypertension: Secondary | ICD-10-CM | POA: Diagnosis not present

## 2014-11-07 DIAGNOSIS — M6282 Rhabdomyolysis: Secondary | ICD-10-CM | POA: Diagnosis not present

## 2014-11-07 DIAGNOSIS — E1129 Type 2 diabetes mellitus with other diabetic kidney complication: Secondary | ICD-10-CM | POA: Diagnosis not present

## 2014-11-07 DIAGNOSIS — E119 Type 2 diabetes mellitus without complications: Secondary | ICD-10-CM | POA: Diagnosis not present

## 2014-11-07 DIAGNOSIS — G9349 Other encephalopathy: Secondary | ICD-10-CM | POA: Diagnosis not present

## 2014-11-07 DIAGNOSIS — I2510 Atherosclerotic heart disease of native coronary artery without angina pectoris: Secondary | ICD-10-CM | POA: Diagnosis not present

## 2014-11-07 DIAGNOSIS — M1A9XX0 Chronic gout, unspecified, without tophus (tophi): Secondary | ICD-10-CM | POA: Diagnosis not present

## 2014-11-07 DIAGNOSIS — Z8546 Personal history of malignant neoplasm of prostate: Secondary | ICD-10-CM | POA: Diagnosis not present

## 2014-11-07 DIAGNOSIS — E784 Other hyperlipidemia: Secondary | ICD-10-CM | POA: Diagnosis not present

## 2014-11-07 LAB — HSV(HERPES SMPLX VRS)ABS-I+II(IGG)-CSF

## 2014-11-07 NOTE — Patient Outreach (Addendum)
[facility]) Care Management  11/07/2014  GERIK COBERLY 1930-02-12 [id number]   Assessment-CSW received referral from [place] for CSW to follow up on discharge planning as patient has discharged from hospital and is now at [facility] SNF. CSW contacted patient's daughter [name] and HIPPA verifications were received. CSW introduced self, reason for call and of [place]. [name] remembers discussing [facility] services with hospital liaison but desired additional information. CSW explained that service is free for patient but [name] continued to have concerns for service not being free of charge. [name] was agreed to SNF visit that she can attend on 11/10/14. 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 CSW sent involvement letter to PCP.  Marland KitchenEula Fried, BSW, MSW, [email or web address] Phone: [phone] Fax: [phone]

## 2014-11-11 ENCOUNTER — Other Ambulatory Visit: Payer: Self-pay | Admitting: Licensed Clinical Social Worker

## 2014-11-11 DIAGNOSIS — N189 Chronic kidney disease, unspecified: Secondary | ICD-10-CM | POA: Diagnosis not present

## 2014-11-11 DIAGNOSIS — E119 Type 2 diabetes mellitus without complications: Secondary | ICD-10-CM | POA: Diagnosis not present

## 2014-11-11 DIAGNOSIS — I10 Essential (primary) hypertension: Secondary | ICD-10-CM | POA: Diagnosis not present

## 2014-11-11 DIAGNOSIS — R600 Localized edema: Secondary | ICD-10-CM | POA: Diagnosis not present

## 2014-11-11 NOTE — Patient Outreach (Addendum)
[facility]) Care Management  [facility] Social Work  11/11/2014  John Romero [birth year] [id number]   Current Medications:  Current Outpatient Prescriptions  Medication Sig Dispense Refill  . allopurinol (ZYLOPRIM) 300 MG tablet TAKE 1/2 TABLET DAILY TO PREVENT GOUT ATTACK 90 tablet 1  . amLODipine (NORVASC) 10 MG tablet TAKE 1 TABLET DAILY FOR BLOOD PRESSURE (Patient not taking: Reported on 10/30/2014) 90 tablet 0  . aspirin 325 MG EC tablet Take 325 mg by mouth daily.    . cloNIDine (CATAPRES) 0.3 MG tablet Take one tablet by mouth twice daily for blood pressure 60 tablet 0  . colchicine 0.6 MG tablet Take 1 tablet (0.6 mg total) by mouth daily. X 2 days, then as needed to prevent gout attack    . feeding supplement, ENSURE ENLIVE, (ENSURE ENLIVE) LIQD Take 237 mLs by mouth daily. 237 mL 12  . furosemide (LASIX) 40 MG tablet Take 0.5 tablets (20 mg total) by mouth daily. 30 tablet   . insulin aspart (NOVOLOG FLEXPEN) 100 UNIT/ML FlexPen Inject 5 Units into the skin 3 (three) times daily with meals. 15 mL 2  . insulin aspart (NOVOLOG) 100 UNIT/ML injection Inject 0-15 Units into the skin 3 (three) times daily with meals. Sliding scale  CBG 70 - 120: 0 units: CBG 121 - 150: 2 units; CBG 151 - 200: 3 units; CBG 201 - 250: 5 units; CBG 251 - 300: 8 units;CBG 301 - 350: 11 units; CBG 351 - 400: 15 units; CBG > 400 : 15 units and notify MD 10 mL 11  . Insulin Detemir (LEVEMIR FLEXPEN) 100 UNIT/ML Pen Inject 35 Units into the skin daily at 10 pm. Inject 38 units once daily to control blood sugar *Please send 90 day supply* 10 pen 3  . Insulin Pen Needle (B-D ULTRAFINE III SHORT PEN) 31G X 8 MM MISC Use 2 pen needles daily with the administration of Insulin. DX 250.00 200 each 3  . Metoprolol Tartrate 75 MG TABS Take 75 mg by mouth 2 (two) times daily.    . Multiple Vitamin (MULTIVITAMIN) tablet Take 1 tablet by mouth daily.    . predniSONE (DELTASONE) 10 MG tablet Prednisone dosing:  Take  Prednisone 34m (3 tabs) x 2 days, then 226m(2 tabs) x 2days, then 1074m1 tab) x 2days, then OFF.    . sMarland Kitchenmvastatin (ZOCOR) 20 MG tablet APPOINTMENT OVERDUE, 1 by mouth daily to lower cholesterol 90 tablet 0   No current facility-administered medications for this visit.    Functional Status:  In your present state of health, do you have any difficulty performing the following activities: 10/31/2014  Hearing? N  Vision? N  Difficulty concentrating or making decisions? N  Walking or climbing stairs? N  Dressing or bathing? Y  Doing errands, shopping? N    Fall/Depression Screening:  PHQ 2/9 Scores 11/11/2014 02/19/2014 06/06/2013 04/26/2012  PHQ - 2 Score 0 0 0 0    Assessment: [facility] Patient's daughter ShiEnid Derryd significant other [name] there. CSW introduced self, reason for visit and of [place] was admitted into the hospital after being found in his kitchen face door on the floor. Patient does not remember incident but remembers feeling dizzy before he fell out. Patient has diabetes, gout and hypertension. Patient has CSW spent a lengthy amount of time informing family and patient of [facility]. [facility]. Patient was very independent  before his recent hospitalization, transporting himself to all medical appointments, going to bingo, going to a Tuesday and Thursday retiree meeting, completing yard work, working on car, going to all family events, going grocery shopping going to eat with friends and cooking meals (but usually instant meals.) Patient denies any financial concerns. Patient shares that he has a health care POA (daughter) and a living will. Patient denies any pain. Patient denies any feelings of depressive or hopelessness. Patient reported that he was able to walk with his walker today in physical therapy which is a major improvement. Family informed CSW that he  has "already made so many improvements since coming here" compared to how he was in the hospital. Patient reports that he is usually unsteady on feet and hopes for this to improve once he transitions back home. Patient uses a cane and walker. Patient has great support and his family is very involved in his care. [name] reports that she has already cleaned up his house and removed any obstacles that could cause a fall. [name] also shares that she has the option to work from home which will allow her to work at his residence for a month or two once he transitions back home. Patient's significant other reports that she too will be spending more time at patient's home to ensure all needs are being met. Patient's family are eager for nursing to be involved with patient as patient has little social work needs and CSW informed them that nursing will be involved once he is discharged from SNF. Patient is having a difficult time with boredom during his stay at [facility] but [name] was able to provide positive news to patient to day that she will be able to transport him to [facility] on Thursdays as they usually go together before his hospital admission. CSW provided business cards to all members and an extra welcome folder to patient's significant other [name]. CSW will contact [name] to aid in patient's discharge planning once discharge meeting has taken place at the end of this week.   Plan: CSW will remain available to patient and family for all social work needs.   Eula Fried, BSW, MSW, [email or web address] Phone: [phone] Fax: [phone]

## 2014-11-12 DIAGNOSIS — I4891 Unspecified atrial fibrillation: Secondary | ICD-10-CM | POA: Diagnosis not present

## 2014-11-12 DIAGNOSIS — E119 Type 2 diabetes mellitus without complications: Secondary | ICD-10-CM | POA: Diagnosis not present

## 2014-11-12 DIAGNOSIS — I10 Essential (primary) hypertension: Secondary | ICD-10-CM | POA: Diagnosis not present

## 2014-11-14 DIAGNOSIS — I10 Essential (primary) hypertension: Secondary | ICD-10-CM | POA: Diagnosis not present

## 2014-11-14 DIAGNOSIS — E119 Type 2 diabetes mellitus without complications: Secondary | ICD-10-CM | POA: Diagnosis not present

## 2014-11-14 DIAGNOSIS — I4891 Unspecified atrial fibrillation: Secondary | ICD-10-CM | POA: Diagnosis not present

## 2014-11-14 DIAGNOSIS — N189 Chronic kidney disease, unspecified: Secondary | ICD-10-CM | POA: Diagnosis not present

## 2014-11-18 ENCOUNTER — Encounter: Payer: Self-pay | Admitting: Physician Assistant

## 2014-11-18 ENCOUNTER — Other Ambulatory Visit: Payer: Self-pay | Admitting: Licensed Clinical Social Worker

## 2014-11-18 ENCOUNTER — Other Ambulatory Visit: Payer: Self-pay

## 2014-11-18 NOTE — Progress Notes (Signed)
Cardiology Office Note Date:  11/19/2014  Patient ID:  John Romero, DOB [birth year], MRN [id number] PCP:  Mathews Argyle, MD  Cardiologist:  New to Dr. Gwenlyn Found last admission   Chief Complaint: f/u recent hospitalization - atrial fib, CAD  History of Present Illness: John Romero is a 81 y.o. male with history of DM, HTN, HLD, prostate CA, CKD stage III, CAD s/p CABG (details unclear), hypertensive heart disease (LVH) and recently diagnosed PAF who presents for post-hospital follow-up. Per review of chart he was admitted 10/12-10/18 with acute encephalopathy/AMS felt secondary to hypertensive encephalopathy versus PRES (presenting BP 180/150). He underwent LP which was negative for meningitis. MRI brain showed no acute intracranial process but did mention involutional changes, superimposed component of suspected normal pressure hydrocephalus, old infarcts. He was also found to have new onset atrial fib with RVR - paroxysms of this + NSR during admission. In setting of encephalopathy he was placed on ASA with decision regarding anticoagulation to be made at f/u. He also had mildly elevated troponins up to 0.48 but the cardiology team was not convinced of active ischemia. Carotid duplex 11/02/14: 1-39% bilateral. 2D echo 11/01/14: hypokinesis of basal inferior and inferolateral walls, severe focal basal hypertrophy of the septum, EF 55-60%, mild AI, mod LAE.   He comes in for follow-up today overall doing well. He says he's done "a 180" since recent hospitalization - feeling 100% better. He is totally A+Ox3. He denies any chest pain, SOB, syncope. He feels a rare skip in his heartbeat every so often but in general is unaware of his afib. He plans to be d/c'd from SNF on Friday 11/4. He is working with physical therapy at rehab.   Past Medical History  Diagnosis Date  . Type II or unspecified type diabetes mellitus without mention of complication, uncontrolled   . Gout, unspecified   . Essential  hypertension   . CAD (coronary artery disease)     a. s/p CABG, details unclear.  . Gout, unspecified   . First degree atrioventricular block   . Malignant neoplasm of prostate ([place])   . Unspecified tinnitus   . Old myocardial infarction   . Edema   . Stroke (cerebrum) ([place])     a. CT head 10/2014 showed occult strokes: "Old RIGHT basal ganglia lacunar infarct, small bilateral cerebellar infarcts."  . CKD (chronic kidney disease), stage III   . Hypertensive heart disease     a. 2D echo 11/01/14: hypokinesis of basal inferior and inferolateral walls, severe focal basal hypertrophy of the septum, EF 55-60%, mild AI, mod LAE.   Marland Kitchen Hypertensive encephalopathy 10/2014  . PAF (paroxysmal atrial fibrillation) ([place])     a. Dx 10/2014.   Marland Kitchen Hyperlipidemia     Past Surgical History  Procedure Laterality Date  . Appendectomy    . Prostate surgery    . Coronary artery bypass graft    . Eye surgery  2014    Current Outpatient Prescriptions  Medication Sig Dispense Refill  . allopurinol (ZYLOPRIM) 300 MG tablet TAKE 1/2 TABLET DAILY TO PREVENT GOUT ATTACK 90 tablet 1  . amLODipine (NORVASC) 10 MG tablet TAKE 1 TABLET DAILY FOR BLOOD PRESSURE 90 tablet 0  . aspirin 325 MG EC tablet Take 325 mg by mouth daily.    . Blood Glucose Monitoring Suppl (TRUE METRIX AIR GLUCOSE METER) W/DEVICE KIT CHECK QAM ONCE A DAY  0  . cloNIDine (CATAPRES) 0.3 MG tablet Take one tablet by mouth twice daily for  blood pressure 60 tablet 0  . furosemide (LASIX) 40 MG tablet Take 0.5 tablets (20 mg total) by mouth daily. 30 tablet   . insulin aspart (NOVOLOG) 100 UNIT/ML injection Inject 0-15 Units into the skin 3 (three) times daily with meals. Sliding scale  CBG 70 - 120: 0 units: CBG 121 - 150: 2 units; CBG 151 - 200: 3 units; CBG 201 - 250: 5 units; CBG 251 - 300: 8 units;CBG 301 - 350: 11 units; CBG 351 - 400: 15 units; CBG > 400 : 15 units and notify MD 10 mL 11  . Insulin Detemir (LEVEMIR FLEXPEN) 100 UNIT/ML Pen  Inject 35 Units into the skin daily at 10 pm. Inject 38 units once daily to control blood sugar *Please send 90 day supply* 10 pen 3  . Insulin Pen Needle (B-D ULTRAFINE III SHORT PEN) 31G X 8 MM MISC Use 2 pen needles daily with the administration of Insulin. DX 250.00 200 each 3  . Metoprolol Tartrate 75 MG TABS Take 75 mg by mouth 2 (two) times daily.    . Multiple Vitamin (MULTIVITAMIN) tablet Take 1 tablet by mouth daily.    . simvastatin (ZOCOR) 20 MG tablet APPOINTMENT OVERDUE, 1 by mouth daily to lower cholesterol 90 tablet 0  . TRUE METRIX BLOOD GLUCOSE TEST test strip USE TO CHECK BLOOD SUGARS ONCE DAILY  11   No current facility-administered medications for this visit.    Allergies:   Review of patient's allergies indicates no known allergies.   Social History:  The patient  reports that he has never smoked. His smokeless tobacco use includes Chew. He reports that he does not drink alcohol or use illicit drugs.   Family History:  The patient's family history includes Cancer in his sister and sister; Diabetes in his mother; Heart disease in his father.  ROS:  Please see the history of present illness.  All other systems are reviewed and otherwise negative.   PHYSICAL EXAM:  VS:  BP 160/86 mmHg  Pulse 71  Ht 5' 11"  (1.803 m)  Wt 203 lb (92.08 kg)  BMI 28.33 kg/m2 BMI: Body mass index is 28.33 kg/(m^2). Well nourished, well developed WM, in no acute distress HEENT: normocephalic, atraumatic Neck: no JVD, carotid bruits or masses Cardiac:  normal S1, S2; irregularly irregular; no murmurs, rubs, or gallops Lungs:  clear to auscultation bilaterally, no wheezing, rhonchi or rales Abd: soft, nontender, no hepatomegaly, + BS MS: no deformity or atrophy Ext: 1+ BLE edema, similar to what is documented in cardiology notes Skin: warm and dry, no rash Neuro:  moves all extremities spontaneously, no focal abnormalities noted, follows commands Psych: euthymic mood, full  affect   EKG:  Done today shows atrial fib 71bpm, LVH, occasional PVC, nonspecific ST-T changes  Recent Labs: 10/31/2014: TSH 1.004 11/01/2014: ALT 26; Magnesium 2.0 11/05/2014: BUN 23*; Creatinine, Ser 1.21; Hemoglobin 14.1; Platelets 197; Potassium 3.7; Sodium 137  No results found for requested labs within last 365 days.   Estimated Creatinine Clearance: 54.6 mL/min (by C-G formula based on Cr of 1.21).   Wt Readings from Last 3 Encounters:  11/19/14 203 lb (92.08 kg)  10/31/14 207 lb 7.3 oz (94.1 kg)  02/19/14 207 lb 8 oz (94.121 kg)     Other studies reviewed: Additional studies/records reviewed today include: summarized above  ASSESSMENT AND PLAN:  1. CAD s/p prior CABG with recently elevated troponin - he denies recent anginal symptoms. Suspect elevated troponin was due to demand ischemia  in the setting of above stressors. Will continue to manage medically and observe for anginal symptoms at which time would consider repeat ischemic evaluation. Continue aspirin, BB, statin. 2. Paroxysmal atrial fibrillation - he remains in atrial fib. We had extensive discussion today about the indication for blood thinners. CHADSVASC is 7 putting him at very high risk for stroke. We reviewed risks versus benefits. I have advised discontinuing aspirin and switching to Eliquis. However, at this time, the patient adamantly declines any other blood thinner other than aspirin. He verbalized understanding of continued risk of stroke and understands that aspirin will not prevent a stroke from atrial fib. Given that we are not anticoagulating, we cannot pursue rhythm control at this time. Fortunately, he is minimally symptomatic from his atrial fibrillation. Continue rate control. 3. Essential HTN with hypertensive heart disease - BP running on the higher side in clinic today. He says it does not usually run this high at his facility. Have requested that his SNF log his BP at least 2x/day and call us on  Thursday 11/21/14 with the readings. If his BP remains high, I would likely increase metoprolol to 165m BID. Will follow his chronic LEE. 4. CKD stage III - should be followed by PCP. Patient has f/u after discharge from SNF with Dr. SFelipa Eth 5. Hyperlipidemia - continue statin.  Disposition: F/u with Dr. BGwenlyn Found6-8 weeks. The patient wishes to cancel his AF clinic appointment today because he is not sure they would do anything different - I agree.  Current medicines are reviewed at length with the patient today.  The patient did not have any concerns regarding medicines.  SRaechel AchePA-C 11/19/2014 10:53 AM     [facility] [address] [place] [phone](office)  [phone](fax)

## 2014-11-18 NOTE — Patient Outreach (Addendum)
This RNCM received message from [place] to advise on patients discharge from SNF on this coming Friday.  Unable to contact patient's daughter, HIPPA compliant message left for patient's daughter with this RNCM contact.  Will contact patient on Monday, November 25, 2014

## 2014-11-18 NOTE — Patient Outreach (Signed)
[facility]) Care Management  11/18/2014  John Romero 1930-03-27 [id number]   Assessment-CSW completed outreach to [place] on 11/18/14 and was able to reach patient's daughter successfully. HIPPA verifications received. John Romero stated that she feels comfortable with the discharge plan and feels "very prepared." John Romero wanted clarification on the different roles between [place] and CSW provided education. Patient has been taken off site many times with daughter to go to his residence to see his pet, go to bingo and out to eat. John Romero has been discussing discharge plan with Education officer, museum at [employer]. Patient's last day of therapy will be 11/21/14 and patient will be discharging from SNF on 11/22/14 by noon. John Romero will be working from patient's home for "a month or so" which will help with transition back home. John Romero's main concern are that she will be filling pill back and will need a lot of  medication education. John Romero is also concerned about patient's blood sugar as it is "low in the morning but after he eats it jumps up in the 300's and then they have to give him a shot. I need to know how to handle this and understand his blood sugar levels."  John Romero is interested in getting a life alert for patient and is aware that Medicare cannot pay for service. Patient was provided life alert resource information by CSW. CSW will contact patient after his discharge back home. John Romero expressed appreciation for social work assistance.   Plan-CSW will inform RNCM of patient discharge and will remain available to patient and family.  John Romero, BSW, MSW, [email or web address] Phone: [phone] Fax: [phone]

## 2014-11-19 ENCOUNTER — Encounter: Payer: Self-pay | Admitting: Physician Assistant

## 2014-11-19 ENCOUNTER — Ambulatory Visit (INDEPENDENT_AMBULATORY_CARE_PROVIDER_SITE_OTHER): Payer: Medicare Other | Admitting: Physician Assistant

## 2014-11-19 VITALS — BP 160/86 | HR 71 | Ht 71.0 in | Wt 203.0 lb

## 2014-11-19 DIAGNOSIS — I25810 Atherosclerosis of coronary artery bypass graft(s) without angina pectoris: Secondary | ICD-10-CM | POA: Diagnosis not present

## 2014-11-19 DIAGNOSIS — N183 Chronic kidney disease, stage 3 unspecified: Secondary | ICD-10-CM

## 2014-11-19 DIAGNOSIS — I10 Essential (primary) hypertension: Secondary | ICD-10-CM

## 2014-11-19 DIAGNOSIS — R7989 Other specified abnormal findings of blood chemistry: Secondary | ICD-10-CM

## 2014-11-19 DIAGNOSIS — I480 Paroxysmal atrial fibrillation: Secondary | ICD-10-CM

## 2014-11-19 DIAGNOSIS — R778 Other specified abnormalities of plasma proteins: Secondary | ICD-10-CM

## 2014-11-19 DIAGNOSIS — E785 Hyperlipidemia, unspecified: Secondary | ICD-10-CM

## 2014-11-19 DIAGNOSIS — I119 Hypertensive heart disease without heart failure: Secondary | ICD-10-CM

## 2014-11-19 NOTE — Patient Instructions (Signed)
Medication Instructions:     Labwork:   Testing/Procedures:   Follow-Up: 6-8 weeks with Dr.Berry  Any Other Special Instructions Will Be Listed Below (If Applicable). Please have nursing facility check blood pressure 2 times per day and call with readings om Thursday Nov. 3rd     If you need a refill on your cardiac medications before your next appointment, please call your pharmacy.  Thanks for choosing [facility]!!!

## 2014-11-21 DIAGNOSIS — E119 Type 2 diabetes mellitus without complications: Secondary | ICD-10-CM | POA: Diagnosis not present

## 2014-11-21 DIAGNOSIS — N189 Chronic kidney disease, unspecified: Secondary | ICD-10-CM | POA: Diagnosis not present

## 2014-11-21 DIAGNOSIS — I10 Essential (primary) hypertension: Secondary | ICD-10-CM | POA: Diagnosis not present

## 2014-11-21 DIAGNOSIS — I2510 Atherosclerotic heart disease of native coronary artery without angina pectoris: Secondary | ICD-10-CM | POA: Diagnosis not present

## 2014-11-23 DIAGNOSIS — L89312 Pressure ulcer of right buttock, stage 2: Secondary | ICD-10-CM | POA: Diagnosis not present

## 2014-11-23 DIAGNOSIS — I129 Hypertensive chronic kidney disease with stage 1 through stage 4 chronic kidney disease, or unspecified chronic kidney disease: Secondary | ICD-10-CM | POA: Diagnosis not present

## 2014-11-23 DIAGNOSIS — M6281 Muscle weakness (generalized): Secondary | ICD-10-CM | POA: Diagnosis not present

## 2014-11-23 DIAGNOSIS — L89321 Pressure ulcer of left buttock, stage 1: Secondary | ICD-10-CM | POA: Diagnosis not present

## 2014-11-24 DIAGNOSIS — L89312 Pressure ulcer of right buttock, stage 2: Secondary | ICD-10-CM | POA: Diagnosis not present

## 2014-11-24 DIAGNOSIS — L89321 Pressure ulcer of left buttock, stage 1: Secondary | ICD-10-CM | POA: Diagnosis not present

## 2014-11-25 ENCOUNTER — Other Ambulatory Visit: Payer: Self-pay

## 2014-11-25 DIAGNOSIS — Z79899 Other long term (current) drug therapy: Secondary | ICD-10-CM | POA: Diagnosis not present

## 2014-11-25 DIAGNOSIS — I872 Venous insufficiency (chronic) (peripheral): Secondary | ICD-10-CM | POA: Diagnosis not present

## 2014-11-25 DIAGNOSIS — L89321 Pressure ulcer of left buttock, stage 1: Secondary | ICD-10-CM | POA: Diagnosis not present

## 2014-11-25 DIAGNOSIS — Z794 Long term (current) use of insulin: Secondary | ICD-10-CM | POA: Diagnosis not present

## 2014-11-25 DIAGNOSIS — E1140 Type 2 diabetes mellitus with diabetic neuropathy, unspecified: Secondary | ICD-10-CM | POA: Diagnosis not present

## 2014-11-25 DIAGNOSIS — N183 Chronic kidney disease, stage 3 (moderate): Secondary | ICD-10-CM | POA: Diagnosis not present

## 2014-11-25 DIAGNOSIS — R739 Hyperglycemia, unspecified: Secondary | ICD-10-CM | POA: Diagnosis not present

## 2014-11-25 DIAGNOSIS — E1121 Type 2 diabetes mellitus with diabetic nephropathy: Secondary | ICD-10-CM | POA: Diagnosis not present

## 2014-11-25 DIAGNOSIS — I4891 Unspecified atrial fibrillation: Secondary | ICD-10-CM | POA: Diagnosis not present

## 2014-11-25 DIAGNOSIS — I129 Hypertensive chronic kidney disease with stage 1 through stage 4 chronic kidney disease, or unspecified chronic kidney disease: Secondary | ICD-10-CM | POA: Diagnosis not present

## 2014-11-25 DIAGNOSIS — L89312 Pressure ulcer of right buttock, stage 2: Secondary | ICD-10-CM | POA: Diagnosis not present

## 2014-11-26 ENCOUNTER — Ambulatory Visit (HOSPITAL_COMMUNITY): Payer: Medicare Other | Admitting: Nurse Practitioner

## 2014-11-26 ENCOUNTER — Other Ambulatory Visit: Payer: Self-pay

## 2014-11-26 DIAGNOSIS — L89312 Pressure ulcer of right buttock, stage 2: Secondary | ICD-10-CM | POA: Diagnosis not present

## 2014-11-26 DIAGNOSIS — L89321 Pressure ulcer of left buttock, stage 1: Secondary | ICD-10-CM | POA: Diagnosis not present

## 2014-11-26 NOTE — Patient Outreach (Signed)
This RNCM was successful in making contact with patient's daughter, John Romero. John Romero is listed as contact for patient, was able to identify patient by providing [place] identifiers of date of birth and address.  This RNCM stated the purpose of call, briefly described [facility] Programs and referral process. John Romero stated she and her father were bombarded with people coming in with [facility] PT/OT, Nursing.  John Romero stated further with his appointments she feels overwhelmed at this time with another service coming in.  This RNCM supported John Romero, explained to her the difference in services provided by [facility] and home care agencies.  However, John Romero stated she login this RNCM's cell number and the [id number] number for [facility] to call if services needed.  John Romero also agreed to receive a [employer].

## 2014-11-26 NOTE — Patient Outreach (Signed)
November 25, 2014  This RNCM received a voice mail from patient's primary caregiver, daughter, [name].  [name] stated she and her father would not be available for telephone contact today as he has appointments with his primary care physician and Alvis Lemmings is scheduled to come out.  This RNCM returned daughters call, returned receipt of message and asked if we could talk over the telephone on tomorrow.  [name] agreed on contact via telephone tomorrow.

## 2014-11-27 ENCOUNTER — Other Ambulatory Visit: Payer: Self-pay | Admitting: Licensed Clinical Social Worker

## 2014-11-27 NOTE — Patient Outreach (Signed)
[facility]) Care Management  11/27/2014  HASAN DOUSE 11-Oct-1930 [id number]   Notification from [name], RN to close case due to patient refused [place] Management services.  Thanks, [name]. [place], [place] Assistant Phone: [phone] Fax: [phone]

## 2014-11-27 NOTE — Patient Outreach (Signed)
John Romero) Romero Management  11/27/2014  John Romero 1930-09-02 [id number]   Assessment-CSW completed outreach to patient's daughter John Romero who was currently at patient's residence. HIPPA verifications provided. John Romero reports that "This has been a great transition back home. He is very confident in getting better and comfortable with the Romero he is getting." Patient decided against occupational therapy but is receiving nursing and physical therapy from [facility]. John Romero reports that patient respects his nurse and therefore he listens to her recommendations. John Romero shares that patient still will not "use walker like he is suppose to."  John Romero reports that friends, family, neighbors have come to visit patient. John Romero takes patient out of the house for bingo, out to eat or to visit friends. John Romero informs CSW that her sister will be visiting the next few weeks from [place] and will assist patient with his needs as John Romero plans to go back to work (John Romero has been working from patient's residence) next week. Patient's daughter communicates that sister is getting patient a life alert. John Romero reports at this time that patient is satisfied with his current level of Romero and providers and does not wish for him to be overwhelmed or get confused with having multiple services work with him. Patient reports to CSW that he is appreciative of [facility] services but is comfortable with the services he is receiving now. CSW educated patient and family on the difference between home health and [facility]. CSW provided Ray with contact numbers in case she were to have questions in the future. CSW will now discharge patient from caseload at this time. John Romero reports that she has already informed RNCM of their wishes to discontinue services at this time.   Plan-CSW will discharge patient from caseload at this time. CSW will update RNCM.  Eula Fried, BSW, MSW, [email or web address] Phone: [phone] Fax: [phone]

## 2014-11-29 DIAGNOSIS — I739 Peripheral vascular disease, unspecified: Secondary | ICD-10-CM | POA: Diagnosis not present

## 2014-11-29 DIAGNOSIS — E1051 Type 1 diabetes mellitus with diabetic peripheral angiopathy without gangrene: Secondary | ICD-10-CM | POA: Diagnosis not present

## 2014-11-29 DIAGNOSIS — L89321 Pressure ulcer of left buttock, stage 1: Secondary | ICD-10-CM | POA: Diagnosis not present

## 2014-11-29 DIAGNOSIS — M79609 Pain in unspecified limb: Secondary | ICD-10-CM | POA: Diagnosis not present

## 2014-11-29 DIAGNOSIS — L603 Nail dystrophy: Secondary | ICD-10-CM | POA: Diagnosis not present

## 2014-11-29 DIAGNOSIS — L89312 Pressure ulcer of right buttock, stage 2: Secondary | ICD-10-CM | POA: Diagnosis not present

## 2014-11-29 LAB — FUNGUS CULTURE W SMEAR: FUNGAL SMEAR: NONE SEEN

## 2014-12-02 DIAGNOSIS — L89321 Pressure ulcer of left buttock, stage 1: Secondary | ICD-10-CM | POA: Diagnosis not present

## 2014-12-02 DIAGNOSIS — L89312 Pressure ulcer of right buttock, stage 2: Secondary | ICD-10-CM | POA: Diagnosis not present

## 2014-12-03 DIAGNOSIS — L89312 Pressure ulcer of right buttock, stage 2: Secondary | ICD-10-CM | POA: Diagnosis not present

## 2014-12-03 DIAGNOSIS — L89321 Pressure ulcer of left buttock, stage 1: Secondary | ICD-10-CM | POA: Diagnosis not present

## 2014-12-05 DIAGNOSIS — L89312 Pressure ulcer of right buttock, stage 2: Secondary | ICD-10-CM | POA: Diagnosis not present

## 2014-12-05 DIAGNOSIS — L89321 Pressure ulcer of left buttock, stage 1: Secondary | ICD-10-CM | POA: Diagnosis not present

## 2014-12-10 DIAGNOSIS — L89321 Pressure ulcer of left buttock, stage 1: Secondary | ICD-10-CM | POA: Diagnosis not present

## 2014-12-10 DIAGNOSIS — L89312 Pressure ulcer of right buttock, stage 2: Secondary | ICD-10-CM | POA: Diagnosis not present

## 2014-12-13 DIAGNOSIS — L89312 Pressure ulcer of right buttock, stage 2: Secondary | ICD-10-CM | POA: Diagnosis not present

## 2014-12-13 DIAGNOSIS — L89321 Pressure ulcer of left buttock, stage 1: Secondary | ICD-10-CM | POA: Diagnosis not present

## 2014-12-17 DIAGNOSIS — L89312 Pressure ulcer of right buttock, stage 2: Secondary | ICD-10-CM | POA: Diagnosis not present

## 2014-12-17 DIAGNOSIS — L89321 Pressure ulcer of left buttock, stage 1: Secondary | ICD-10-CM | POA: Diagnosis not present

## 2014-12-19 DIAGNOSIS — L89312 Pressure ulcer of right buttock, stage 2: Secondary | ICD-10-CM | POA: Diagnosis not present

## 2014-12-19 DIAGNOSIS — L89321 Pressure ulcer of left buttock, stage 1: Secondary | ICD-10-CM | POA: Diagnosis not present

## 2014-12-20 DIAGNOSIS — L89312 Pressure ulcer of right buttock, stage 2: Secondary | ICD-10-CM | POA: Diagnosis not present

## 2014-12-20 DIAGNOSIS — L89321 Pressure ulcer of left buttock, stage 1: Secondary | ICD-10-CM | POA: Diagnosis not present

## 2014-12-23 DIAGNOSIS — L89312 Pressure ulcer of right buttock, stage 2: Secondary | ICD-10-CM | POA: Diagnosis not present

## 2014-12-23 DIAGNOSIS — L89321 Pressure ulcer of left buttock, stage 1: Secondary | ICD-10-CM | POA: Diagnosis not present

## 2014-12-24 DIAGNOSIS — L89312 Pressure ulcer of right buttock, stage 2: Secondary | ICD-10-CM | POA: Diagnosis not present

## 2014-12-24 DIAGNOSIS — L89321 Pressure ulcer of left buttock, stage 1: Secondary | ICD-10-CM | POA: Diagnosis not present

## 2014-12-27 ENCOUNTER — Telehealth: Payer: Self-pay | Admitting: Cardiovascular Disease

## 2014-12-27 DIAGNOSIS — L89321 Pressure ulcer of left buttock, stage 1: Secondary | ICD-10-CM | POA: Diagnosis not present

## 2014-12-27 DIAGNOSIS — I480 Paroxysmal atrial fibrillation: Secondary | ICD-10-CM | POA: Diagnosis not present

## 2014-12-27 DIAGNOSIS — R0609 Other forms of dyspnea: Secondary | ICD-10-CM | POA: Diagnosis not present

## 2014-12-27 DIAGNOSIS — L89312 Pressure ulcer of right buttock, stage 2: Secondary | ICD-10-CM | POA: Diagnosis not present

## 2014-12-27 NOTE — Telephone Encounter (Signed)
Per Dr Gwenlyn Found, Agree

## 2014-12-27 NOTE — Telephone Encounter (Signed)
His cardiologist is Dr. Pernell Dupre

## 2014-12-27 NOTE — Telephone Encounter (Signed)
Spoke to [employer], she states she called primary not able to see patient.  Per Jenny Reichmann, patient is short of breath walking short distance, home is not that big. Heart rate 68, wheezing,  3 lb weight gain in the last 2 days. Patient has taken morning medications  - lasix 20 mg ,along with other medications.  RN per protocol-  Instructed to have patient to take extra 20 mg for 3 three days and have BMP checked Monday 12/30/14 Appointment schedule for 12/151/16 with Cecilie Kicks NP at 10 am [place]. If needed go to hospital if symptoms get worse. Jenny Reichmann will inform patient   Will defer to Dr Gwenlyn Found to if anything else is needed?

## 2014-12-27 NOTE — Telephone Encounter (Signed)
John Romero is calling because John Romero is having shortness of breath , blood pressure is running high.150/90 and he has a 3lb weight gain within the past two days. Please Call   Thanks

## 2014-12-27 NOTE — Telephone Encounter (Signed)
Patient will be seeing  Dr Gwenlyn Found on Jan 22 2015 according  Sharrell Ku last note  Patient is Dr Gwenlyn Found Patient was formed patient to Dr Ron Parker

## 2014-12-30 DIAGNOSIS — L89321 Pressure ulcer of left buttock, stage 1: Secondary | ICD-10-CM | POA: Diagnosis not present

## 2014-12-30 DIAGNOSIS — L89312 Pressure ulcer of right buttock, stage 2: Secondary | ICD-10-CM | POA: Diagnosis not present

## 2014-12-30 DIAGNOSIS — N183 Chronic kidney disease, stage 3 (moderate): Secondary | ICD-10-CM | POA: Diagnosis not present

## 2014-12-31 DIAGNOSIS — L89321 Pressure ulcer of left buttock, stage 1: Secondary | ICD-10-CM | POA: Diagnosis not present

## 2014-12-31 DIAGNOSIS — L89312 Pressure ulcer of right buttock, stage 2: Secondary | ICD-10-CM | POA: Diagnosis not present

## 2015-01-01 ENCOUNTER — Encounter: Payer: Self-pay | Admitting: Cardiovascular Disease

## 2015-01-01 ENCOUNTER — Ambulatory Visit (INDEPENDENT_AMBULATORY_CARE_PROVIDER_SITE_OTHER): Payer: Medicare Other | Admitting: Cardiovascular Disease

## 2015-01-01 VITALS — BP 148/78 | HR 75 | Ht 71.0 in | Wt 215.8 lb

## 2015-01-01 DIAGNOSIS — I25810 Atherosclerosis of coronary artery bypass graft(s) without angina pectoris: Secondary | ICD-10-CM

## 2015-01-01 DIAGNOSIS — R0602 Shortness of breath: Secondary | ICD-10-CM | POA: Diagnosis not present

## 2015-01-01 DIAGNOSIS — R601 Generalized edema: Secondary | ICD-10-CM | POA: Diagnosis not present

## 2015-01-01 DIAGNOSIS — I10 Essential (primary) hypertension: Secondary | ICD-10-CM | POA: Diagnosis not present

## 2015-01-01 DIAGNOSIS — I481 Persistent atrial fibrillation: Secondary | ICD-10-CM | POA: Diagnosis not present

## 2015-01-01 DIAGNOSIS — I4819 Other persistent atrial fibrillation: Secondary | ICD-10-CM

## 2015-01-01 MED ORDER — FUROSEMIDE 40 MG PO TABS: 40.0000 mg | ORAL_TABLET | Freq: Two times a day (BID) | ORAL | Status: DC

## 2015-01-01 MED ORDER — POTASSIUM CHLORIDE ER 20 MEQ PO TBCR: 20.0000 meq | EXTENDED_RELEASE_TABLET | Freq: Every day | ORAL | Status: DC

## 2015-01-01 NOTE — Assessment & Plan Note (Signed)
History of hyperlipidemia on statin therapy followed by his PCP 

## 2015-01-01 NOTE — Assessment & Plan Note (Signed)
History of CAD status post coronary artery bypass grafting back in 1995 with 7 grafts placed at that time. He was followed by Dr. Tamala Julian up until 5 years ago. He was recently admitted with hypertensive crisis and encephalopathy with mildly elevated troponins and EKG changes with what appeared to be new onset A. Fib. 2-D echo was normal. Cholesterol he sees had progressive weight gain and increasing dyspnea on exertion and shortness of breath  Rest. His primary care physician initiated on ACE inhibitor and a diuretic. He has gained 12 pounds over the last 6 weeks and has 3+ pitting edema with some basilar crackles. Double his diuretic and add potassium repletion. We'll check a 2-D echocardiogram. I'll recheck electrolytes  next week and he'll be seen back in the office for follow-up.

## 2015-01-01 NOTE — Addendum Note (Signed)
Addended by: Newt Minion on: 01/01/2015 05:49 PM   Modules accepted: Orders, Medications

## 2015-01-01 NOTE — Assessment & Plan Note (Signed)
History of hypertension with blood pressure measured today at 148/78. He is on clonidine, lisinopril, metoprolol and amlodipine. Continue current meds at current dosing

## 2015-01-01 NOTE — Assessment & Plan Note (Signed)
Patient has been atrial fibrillation since his recent auscultation October. He is rate controlled. He did have a discussion with [name] PAC regarding beginning oral anticoagulation however at that time the patient declined and wished to remain on aspirin. We had another discussion about oral hydration today which he is going to entertain.

## 2015-01-01 NOTE — Patient Instructions (Addendum)
Medication Instructions:  Your physician has recommended you make the following change in your medication:  1) INCREASE Lasix 40 mg by mouth TWICE daily 2) START K-Dur 20 mg by mouth ONCE daily   Labwork: Your physician recommends that you return for lab work in: Monday Dec. 19th The lab can be found on the FIRST FLOOR of out building in [address]   Testing/Procedures: Your physician has requested that you have an echocardiogram. Echocardiography is a painless test that uses sound waves to create images of your heart. It provides your doctor with information about the size and shape of your heart and how well your heart's chambers and valves are working. This procedure takes approximately one hour. There are no restrictions for this procedure.    Follow-Up: Your physician recommends that you schedule a follow-up appointment ON Tuesday 12/20 WITH EXTENDER  Your physician recommends that you schedule a follow-up appointment in: [facility]   Any Other Special Instructions Will Be Listed Below (If Applicable).     If you need a refill on your cardiac medications before your next appointment, please call your pharmacy.

## 2015-01-01 NOTE — Progress Notes (Signed)
01/01/2015 John Romero   1930-02-19  [id number]  Primary Physician Mathews Argyle, MD Primary Cardiologist: [name] MD [name]   HPI:  Mr. Lucarelli is an 81 year old moderately overweight widowed Caucasian male father of 2, grandfather to one grandchild is retired from working at [employer]. I recently saw him in the hospital when he was admitted with hypertensive encephalopathy. He has a history of hypertension, hyperlipidemia and type 2 diabetes. He has chronic kidney disease stage III and new onset A. Fib. He had coronary artery bypass grafting back in 1995. A 2-D echo during his hospitalization showed normal left ventricular function. He saw [name] [facility] in the office for his initial posthospital follow-up on 11/19/14 and was doing well at that time. She did have a discussion with him regarding beginning oral anticoagulation  given his high CHA2DSVASC2 score (7) however the patient declined at that time wishing to remain on aspirin. Over the last several weeks she's had progressive weight gain, dyspnea or resting shortness of breath. He is recently begun on ACE inhibitor and an oral diuretic by his primary care physician.   Current Outpatient Prescriptions  Medication Sig Dispense Refill  . allopurinol (ZYLOPRIM) 300 MG tablet TAKE 1/2 TABLET DAILY TO PREVENT GOUT ATTACK 90 tablet 1  . amLODipine (NORVASC) 10 MG tablet TAKE 1 TABLET DAILY FOR BLOOD PRESSURE 90 tablet 0  . aspirin 325 MG EC tablet Take 325 mg by mouth daily.    . Blood Glucose Monitoring Suppl (TRUE METRIX AIR GLUCOSE METER) W/DEVICE KIT CHECK QAM ONCE A DAY  0  . cloNIDine (CATAPRES) 0.3 MG tablet Take one tablet by mouth twice daily for blood pressure 60 tablet 0  . furosemide (LASIX) 40 MG tablet Take 1 tablet (40 mg total) by mouth 2 (two) times daily. 90 tablet 3  . insulin aspart (NOVOLOG) 100 UNIT/ML injection Inject 0-15 Units into the skin 3 (three) times daily with meals. Sliding  scale  CBG 70 - 120: 0 units: CBG 121 - 150: 2 units; CBG 151 - 200: 3 units; CBG 201 - 250: 5 units; CBG 251 - 300: 8 units;CBG 301 - 350: 11 units; CBG 351 - 400: 15 units; CBG > 400 : 15 units and notify MD 10 mL 11  . Insulin Detemir (LEVEMIR FLEXPEN) 100 UNIT/ML Pen Inject 35 Units into the skin daily at 10 pm. Inject 38 units once daily to control blood sugar *Please send 90 day supply* 10 pen 3  . Insulin Pen Needle (B-D ULTRAFINE III SHORT PEN) 31G X 8 MM MISC Use 2 pen needles daily with the administration of Insulin. DX 250.00 200 each 3  . lisinopril (PRINIVIL,ZESTRIL) 5 MG tablet Take 1 tablet by mouth daily.    . Metoprolol Tartrate 75 MG TABS Take 75 mg by mouth 2 (two) times daily.    . Multiple Vitamin (MULTIVITAMIN) tablet Take 1 tablet by mouth daily.    . simvastatin (ZOCOR) 20 MG tablet APPOINTMENT OVERDUE, 1 by mouth daily to lower cholesterol 90 tablet 0  . TRUE METRIX BLOOD GLUCOSE TEST test strip USE TO CHECK BLOOD SUGARS ONCE DAILY  11  . potassium chloride 20 MEQ TBCR Take 20 mEq by mouth daily. 90 tablet 3   No current facility-administered medications for this visit.    No Known Allergies  Social History   Social History  . Marital Status: Married    Spouse Name: N/A  . Number of Children: N/A  .  Years of Education: N/A   Occupational History  . Not on file.   Social History Main Topics  . Smoking status: Never Smoker   . Smokeless tobacco: Current User    Types: Chew  . Alcohol Use: No  . Drug Use: No  . Sexual Activity: Not on file     Comment: unknown   Other Topics Concern  . Not on file   Social History Narrative     Review of Systems: General: negative for chills, fever, night sweats or weight changes.  Cardiovascular: negative for chest pain, dyspnea on exertion, edema, orthopnea, palpitations, paroxysmal nocturnal dyspnea or shortness of breath Dermatological: negative for rash Respiratory: negative for cough or wheezing Urologic:  negative for hematuria Abdominal: negative for nausea, vomiting, diarrhea, bright red blood per rectum, melena, or hematemesis Neurologic: negative for visual changes, syncope, or dizziness All other systems reviewed and are otherwise negative except as noted above.    Blood pressure 148/78, pulse 75, height 5' 11"  (1.803 m), weight 215 lb 12.8 oz (97.886 kg).  General appearance: alert and no distress Neck: no adenopathy, no carotid bruit, no JVD, supple, symmetrical, trachea midline and thyroid not enlarged, symmetric, no tenderness/mass/nodules Lungs: right basilar crackles Heart: irregularly irregular rhythm Extremities: 3+ pitting edema bilaterally  EKG atrial fibrillation with a ventricular response of 69 and nonspecific ST and T-wave changes. I personally reviewed this EKG  ASSESSMENT AND PLAN:   PAF (paroxysmal atrial fibrillation) ([place]) Patient has been atrial fibrillation since his recent auscultation October. He is rate controlled. He did have a discussion with [name] [facility] regarding beginning oral anticoagulation however at that time the patient declined and wished to remain on aspirin. We had another discussion about oral hydration today which he is going to entertain.  Hyperlipidemia History of hyperlipidemia on statin therapy followed by his PCP  Essential hypertension History of hypertension with blood pressure measured today at 148/78. He is on clonidine, lisinopril, metoprolol and amlodipine. Continue current meds at current dosing  CAD (coronary artery disease) of artery bypass graft History of CAD status post coronary artery bypass grafting back in 1995 with 7 grafts placed at that time. He was followed by Dr. Tamala Julian up until 5 years ago. He was recently admitted with hypertensive crisis and encephalopathy with mildly elevated troponins and EKG changes with what appeared to be new onset A. Fib. 2-D echo was normal. Cholesterol he sees had progressive weight gain and  increasing dyspnea on exertion and shortness of breath  Rest. His primary care physician initiated on ACE inhibitor and a diuretic. He has gained 12 pounds over the last 6 weeks and has 3+ pitting edema with some basilar crackles. Double his diuretic and add potassium repletion. We'll check a 2-D echocardiogram. I'll recheck electrolytes  next week and he'll be seen back in the office for follow-up.      [name] MD FACP,FACC,FAHA, [facility] 01/01/2015 4:01 PM

## 2015-01-02 ENCOUNTER — Other Ambulatory Visit: Payer: Self-pay

## 2015-01-02 ENCOUNTER — Telehealth: Payer: Self-pay | Admitting: Cardiovascular Disease

## 2015-01-02 ENCOUNTER — Ambulatory Visit (HOSPITAL_COMMUNITY)
Admission: RE | Admit: 2015-01-02 | Discharge: 2015-01-02 | Disposition: A | Discharge status: Home / Self Care | Payer: Medicare Other | Source: Ambulatory Visit | Attending: Cardiovascular Disease | Admitting: Cardiovascular Disease

## 2015-01-02 ENCOUNTER — Ambulatory Visit: Payer: Medicare Other | Admitting: Cardiology

## 2015-01-02 DIAGNOSIS — I10 Essential (primary) hypertension: Secondary | ICD-10-CM | POA: Diagnosis not present

## 2015-01-02 DIAGNOSIS — R29898 Other symptoms and signs involving the musculoskeletal system: Secondary | ICD-10-CM | POA: Insufficient documentation

## 2015-01-02 DIAGNOSIS — L89321 Pressure ulcer of left buttock, stage 1: Secondary | ICD-10-CM | POA: Diagnosis not present

## 2015-01-02 DIAGNOSIS — E119 Type 2 diabetes mellitus without complications: Secondary | ICD-10-CM | POA: Insufficient documentation

## 2015-01-02 DIAGNOSIS — I481 Persistent atrial fibrillation: Secondary | ICD-10-CM | POA: Insufficient documentation

## 2015-01-02 DIAGNOSIS — I517 Cardiomegaly: Secondary | ICD-10-CM | POA: Insufficient documentation

## 2015-01-02 DIAGNOSIS — I509 Heart failure, unspecified: Secondary | ICD-10-CM | POA: Diagnosis not present

## 2015-01-02 DIAGNOSIS — E785 Hyperlipidemia, unspecified: Secondary | ICD-10-CM | POA: Diagnosis not present

## 2015-01-02 DIAGNOSIS — L89312 Pressure ulcer of right buttock, stage 2: Secondary | ICD-10-CM | POA: Diagnosis not present

## 2015-01-02 DIAGNOSIS — I352 Nonrheumatic aortic (valve) stenosis with insufficiency: Secondary | ICD-10-CM | POA: Insufficient documentation

## 2015-01-02 DIAGNOSIS — R0602 Shortness of breath: Secondary | ICD-10-CM | POA: Diagnosis not present

## 2015-01-02 DIAGNOSIS — I4819 Other persistent atrial fibrillation: Secondary | ICD-10-CM

## 2015-01-02 NOTE — Telephone Encounter (Signed)
Spoke with pt to discuss his medications. Pt stated he already has 20 mg K tablets at home and did not pick up the new RX.  Also update RX as he has a duplicate of Lisinporil on his list, that was updated.  Pt states he has enough Lasix to continue to double up (take 40 mg BID) to last at least thought next Wed. When he returns for f/u OV.

## 2015-01-02 NOTE — Telephone Encounter (Signed)
Received a call from patient's daughter [name].She stated patient saw Dr.Berry yesterday 01/01/15 for sob.Stated lasix increased to 40 mg twice a day.Patient had a echo this morning.Stated father is still sob.Stated he cannot walk from room to room without being sob.Stated she wanted to ask Dr.Berry what to do.Advised I will speak to [facility] and call you back.

## 2015-01-02 NOTE — Telephone Encounter (Signed)
Returned call to daughter [name].I spoke to [facility] he advised patient needs to go to ER he may need IV Lasix.Trish called.

## 2015-01-02 NOTE — Progress Notes (Signed)
  Echocardiogram 2D Echocardiogram has been performed.  Jennette Dubin 01/02/2015, 11:28 AM

## 2015-01-02 NOTE — Telephone Encounter (Signed)
Please call,concerning his medicine. °

## 2015-01-03 DIAGNOSIS — L89312 Pressure ulcer of right buttock, stage 2: Secondary | ICD-10-CM | POA: Diagnosis not present

## 2015-01-03 DIAGNOSIS — L89321 Pressure ulcer of left buttock, stage 1: Secondary | ICD-10-CM | POA: Diagnosis not present

## 2015-01-06 DIAGNOSIS — L89312 Pressure ulcer of right buttock, stage 2: Secondary | ICD-10-CM | POA: Diagnosis not present

## 2015-01-06 DIAGNOSIS — I4891 Unspecified atrial fibrillation: Secondary | ICD-10-CM | POA: Diagnosis not present

## 2015-01-06 DIAGNOSIS — R0602 Shortness of breath: Secondary | ICD-10-CM | POA: Diagnosis not present

## 2015-01-06 DIAGNOSIS — I481 Persistent atrial fibrillation: Secondary | ICD-10-CM | POA: Diagnosis not present

## 2015-01-06 DIAGNOSIS — L89321 Pressure ulcer of left buttock, stage 1: Secondary | ICD-10-CM | POA: Diagnosis not present

## 2015-01-07 DIAGNOSIS — L89312 Pressure ulcer of right buttock, stage 2: Secondary | ICD-10-CM | POA: Diagnosis not present

## 2015-01-07 DIAGNOSIS — L89321 Pressure ulcer of left buttock, stage 1: Secondary | ICD-10-CM | POA: Diagnosis not present

## 2015-01-07 LAB — BASIC METABOLIC PANEL
BUN: 21 mg/dL (ref 7–25)
CO2: 29 mmol/L (ref 20–31)
Calcium: 9 mg/dL (ref 8.6–10.3)
Chloride: 102 mmol/L (ref 98–110)
Creat: 1.17 mg/dL — ABNORMAL HIGH (ref 0.70–1.11)
Glucose, Bld: 245 mg/dL — ABNORMAL HIGH (ref 65–99)
Potassium: 3.9 mmol/L (ref 3.5–5.3)
Sodium: 140 mmol/L (ref 135–146)

## 2015-01-08 ENCOUNTER — Ambulatory Visit (INDEPENDENT_AMBULATORY_CARE_PROVIDER_SITE_OTHER): Payer: Medicare Other | Admitting: Physician Assistant

## 2015-01-08 ENCOUNTER — Encounter: Payer: Self-pay | Admitting: Physician Assistant

## 2015-01-08 VITALS — BP 140/80 | HR 64 | Ht 71.0 in | Wt 208.0 lb

## 2015-01-08 DIAGNOSIS — N183 Chronic kidney disease, stage 3 unspecified: Secondary | ICD-10-CM

## 2015-01-08 DIAGNOSIS — I25700 Atherosclerosis of coronary artery bypass graft(s), unspecified, with unstable angina pectoris: Secondary | ICD-10-CM

## 2015-01-08 DIAGNOSIS — I480 Paroxysmal atrial fibrillation: Secondary | ICD-10-CM | POA: Diagnosis not present

## 2015-01-08 DIAGNOSIS — I5043 Acute on chronic combined systolic (congestive) and diastolic (congestive) heart failure: Secondary | ICD-10-CM | POA: Diagnosis not present

## 2015-01-08 MED ORDER — LISINOPRIL 10 MG PO TABS: 10.0000 mg | ORAL_TABLET | Freq: Every day | ORAL | Status: DC

## 2015-01-08 NOTE — Progress Notes (Signed)
Patient ID: John Romero, male   DOB: 09-01-1930, 81 y.o.   MRN: [id number]    Date:  01/08/2015   ID:  John Romero, DOB 79-Jun-1932, MRN [id number]  PCP:  Mathews Argyle, MD  Primary Cardiologist:  Gwenlyn Found    complaint: one-week follow-up heart failure   History of Present Illness:  John Romero is an 81 year old moderately overweight widowed Caucasian male father of 2, grandfather to one grandchild is retired from working at [employer]. Dr. Gwenlyn Found recently saw him in the hospital when he was admitted with hypertensive encephalopathy.  He has a history of hypertension, hyperlipidemia and type 2 diabetes. He has chronic kidney disease stage III and new onset A. Fib. He had coronary artery bypass grafting back in 1995. A 2-D echo during his hospitalization showed normal left ventricular function. He saw [name] [facility] in the office for his initial posthospital follow-up on 11/19/14 and was doing well at that time. She did have a discussion with him regarding beginning oral anticoagulationgiven his high CHA2DSVASC2 score (7) however, the patient declined at that time wishing to remain on aspirin.  Patient was seen again on January 01 2015.  He is recently begun on ACE inhibitor and an oral diuretic by his primary care physician.   During that office visit he gained 12 pounds has 3+ pitting edema. His diuretic was increased and we checked 2-D echoardiogram.    His ejection fraction was 40-45% with akinesis of the basal mid inferior lateral myocardium. Mild AI and mild to moderate MR. The right and left atrium are mildly dilated.   His previous  Echo just 2 months prior  Revealed an EF 55-60% with hypokinesis of the basal inferior and inferior lateral walls.   patient resents for 1 week evaluation.   He reports breathing is much better. He saw a sleeping on one pillow. His weight is coming down  Pounds. He does note decreased urinary output. We checked his kidney function 2 days ago and it was stable. He is  wearing compression socks. He reports improved lower extremity edema.    The patient currently denies nausea, vomiting, fever, chest pain, shortness of breath, orthopnea, dizziness, PND, cough, congestion, abdominal pain, hematochezia, melena, claudication.  Wt Readings from Last 3 Encounters:  01/08/15 208 lb (94.348 kg)  01/01/15 215 lb 12.8 oz (97.886 kg)  11/19/14 203 lb (92.08 kg)     Past Medical History  Diagnosis Date  . Type II or unspecified type diabetes mellitus without mention of complication, uncontrolled   . Gout, unspecified   . Essential hypertension   . CAD (coronary artery disease)     a. s/p CABG, details unclear.  . Gout, unspecified   . First degree atrioventricular block   . Malignant neoplasm of prostate ([place])   . Unspecified tinnitus   . Old myocardial infarction   . Edema   . Stroke (cerebrum) ([place])     a. CT head 10/2014 showed occult strokes: "Old RIGHT basal ganglia lacunar infarct, small bilateral cerebellar infarcts."  . CKD (chronic kidney disease), stage III   . Hypertensive heart disease     a. 2D echo 11/01/14: hypokinesis of basal inferior and inferolateral walls, severe focal basal hypertrophy of the septum, EF 55-60%, mild AI, mod LAE.   Marland Kitchen Hypertensive encephalopathy 10/2014  . PAF (paroxysmal atrial fibrillation) ([place])     a. Dx 10/2014.   Marland Kitchen Hyperlipidemia     Current Outpatient Prescriptions  Medication Sig Dispense Refill  .  allopurinol (ZYLOPRIM) 300 MG tablet TAKE 1/2 TABLET DAILY TO PREVENT GOUT ATTACK 90 tablet 1  . amLODipine (NORVASC) 10 MG tablet TAKE 1 TABLET DAILY FOR BLOOD PRESSURE 90 tablet 0  . aspirin 325 MG EC tablet Take 325 mg by mouth daily.    . Blood Glucose Monitoring Suppl (TRUE METRIX AIR GLUCOSE METER) W/DEVICE KIT CHECK QAM ONCE A DAY  0  . cloNIDine (CATAPRES) 0.3 MG tablet Take one tablet by mouth twice daily for blood pressure 60 tablet 0  . furosemide (LASIX) 40 MG tablet Take 1 tablet (40 mg total) by mouth 2  (two) times daily. 90 tablet 3  . insulin aspart (NOVOLOG) 100 UNIT/ML injection Inject 0-15 Units into the skin 3 (three) times daily with meals. Sliding scale  CBG 70 - 120: 0 units: CBG 121 - 150: 2 units; CBG 151 - 200: 3 units; CBG 201 - 250: 5 units; CBG 251 - 300: 8 units;CBG 301 - 350: 11 units; CBG 351 - 400: 15 units; CBG > 400 : 15 units and notify MD 10 mL 11  . Insulin Detemir (LEVEMIR) 100 UNIT/ML Pen Inject 38 Units into the skin daily.    . Insulin Pen Needle (B-D ULTRAFINE III SHORT PEN) 31G X 8 MM MISC Use 2 pen needles daily with the administration of Insulin. DX 250.00 200 each 3  . lisinopril (PRINIVIL,ZESTRIL) 10 MG tablet Take 1 tablet (10 mg total) by mouth daily. 30 tablet 11  . Metoprolol Tartrate 75 MG TABS Take 75 mg by mouth 2 (two) times daily.    . Multiple Vitamin (MULTIVITAMIN) tablet Take 1 tablet by mouth daily.    . potassium chloride 20 MEQ TBCR Take 20 mEq by mouth daily. 30 tablet 3  . simvastatin (ZOCOR) 20 MG tablet APPOINTMENT OVERDUE, 1 by mouth daily to lower cholesterol 90 tablet 0  . TRUE METRIX BLOOD GLUCOSE TEST test strip USE TO CHECK BLOOD SUGARS ONCE DAILY  11   No current facility-administered medications for this visit.    Allergies:   No Known Allergies  Social History:  The patient  reports that he has never smoked. His smokeless tobacco use includes Chew. He reports that he does not drink alcohol or use illicit drugs.   Family history:   Family History  Problem Relation Age of Onset  . Diabetes Mother   . Heart disease Father   . Cancer Sister     lung  . Cancer Sister     lung  . Heart attack Neg Hx   . Stroke Neg Hx   . Hypertension Mother     ROS:  Please see the history of present illness.  All other systems reviewed and negative.   PHYSICAL EXAM: VS:  BP 140/80 mmHg  Pulse 64  Ht _0  (1.803 m)  Wt 208 lb (94.348 kg)  BMI 29.02 kg/m2 Well nourished, well developed, in no acute distress HEENT: Pupils are equal  round react to light accommodation extraocular movements are intact.  Neck: no JVDNo cervical lymphadenopathy. Cardiac:  Irregular rate and rhythm without murmurs rubs or gallops Lungs:  clear to auscultation bilaterally, no wheezing, rhonchi or rales Abd: soft, nontender, positive bowel sounds all quadrants, no hepatosplenomegaly Ext:  3+ lower extremity edema.  2+ radial  pulses. Skin: warm and dry Neuro:  Grossly normal       ASSESSMENT AND PLAN:  Problem List Items Addressed This Visit    PAF (paroxysmal atrial fibrillation) ([place])  Relevant Medications   lisinopril (PRINIVIL,ZESTRIL) 10 MG tablet   Chronic kidney disease, stage 3 - Primary   CAD (coronary artery disease) of artery bypass graft   Relevant Medications   lisinopril (PRINIVIL,ZESTRIL) 10 MG tablet   Acute on chronic systolic and diastolic heart failure, NYHA class 3 ([facility])   Relevant Medications   lisinopril (PRINIVIL,ZESTRIL) 10 MG tablet     Patient reports that he is breathing much better. His weight is down 7 pounds however, he continues to have 3+ lower extremity edema.  Lungs are clear without any JVD.  His echocardiogram reveals ejection fraction is decreased from 55-60% to 40-5% and now has akinesis of the basal mid inferior lateral myocardium.   Sounds as though something happened 2 weeks prior to being seen on the 14th which is when his fluid accumulated.   I highly recommended he undergo stress test however, the patient declines. Since he is improving on current diuretic, and labs are stable, we'll continue that dose. I have increased his lisinopril to 10 mg daily.  We also discussed low sodium diet.   He denies having any chest pain at any time. Continues in A. Fib based on exam. Is still contemplating anticoagulation

## 2015-01-08 NOTE — Patient Instructions (Signed)
John Fuller, PA-C, has recommended making the following medication changes: INCREASE Lisinopril to 10 mg - take 1 tablet by mouth daily  >>You may take 2 (two) 5 mg tablets until they run out  >>A new prescription has been sent to your pharmacy electronically  Please keep your previously schedule follow-up appointment with Dr Gwenlyn Found. **PLEASE BRING ALL YOUR MEDICATIONS WITH YOU TO THIS APPOINTMENT!!!

## 2015-01-09 DIAGNOSIS — L89321 Pressure ulcer of left buttock, stage 1: Secondary | ICD-10-CM | POA: Diagnosis not present

## 2015-01-09 DIAGNOSIS — L89312 Pressure ulcer of right buttock, stage 2: Secondary | ICD-10-CM | POA: Diagnosis not present

## 2015-01-14 DIAGNOSIS — L89312 Pressure ulcer of right buttock, stage 2: Secondary | ICD-10-CM | POA: Diagnosis not present

## 2015-01-14 DIAGNOSIS — L89321 Pressure ulcer of left buttock, stage 1: Secondary | ICD-10-CM | POA: Diagnosis not present

## 2015-01-16 DIAGNOSIS — I129 Hypertensive chronic kidney disease with stage 1 through stage 4 chronic kidney disease, or unspecified chronic kidney disease: Secondary | ICD-10-CM | POA: Diagnosis not present

## 2015-01-16 DIAGNOSIS — I4891 Unspecified atrial fibrillation: Secondary | ICD-10-CM | POA: Diagnosis not present

## 2015-01-16 DIAGNOSIS — N183 Chronic kidney disease, stage 3 (moderate): Secondary | ICD-10-CM | POA: Diagnosis not present

## 2015-01-17 DIAGNOSIS — L89321 Pressure ulcer of left buttock, stage 1: Secondary | ICD-10-CM | POA: Diagnosis not present

## 2015-01-17 DIAGNOSIS — L89312 Pressure ulcer of right buttock, stage 2: Secondary | ICD-10-CM | POA: Diagnosis not present

## 2015-01-22 ENCOUNTER — Ambulatory Visit: Payer: Medicare Other | Admitting: Cardiovascular Disease

## 2015-01-22 DIAGNOSIS — E1122 Type 2 diabetes mellitus with diabetic chronic kidney disease: Secondary | ICD-10-CM | POA: Diagnosis not present

## 2015-01-22 DIAGNOSIS — I129 Hypertensive chronic kidney disease with stage 1 through stage 4 chronic kidney disease, or unspecified chronic kidney disease: Secondary | ICD-10-CM | POA: Diagnosis not present

## 2015-01-23 DIAGNOSIS — E1122 Type 2 diabetes mellitus with diabetic chronic kidney disease: Secondary | ICD-10-CM | POA: Diagnosis not present

## 2015-01-23 DIAGNOSIS — I129 Hypertensive chronic kidney disease with stage 1 through stage 4 chronic kidney disease, or unspecified chronic kidney disease: Secondary | ICD-10-CM | POA: Diagnosis not present

## 2015-01-24 ENCOUNTER — Encounter: Payer: Self-pay | Admitting: Cardiovascular Disease

## 2015-01-24 ENCOUNTER — Ambulatory Visit (INDEPENDENT_AMBULATORY_CARE_PROVIDER_SITE_OTHER): Payer: Medicare Other | Admitting: Cardiovascular Disease

## 2015-01-24 VITALS — BP 144/80 | HR 80 | Ht 71.0 in | Wt 202.5 lb

## 2015-01-24 DIAGNOSIS — R0602 Shortness of breath: Secondary | ICD-10-CM

## 2015-01-24 DIAGNOSIS — I5043 Acute on chronic combined systolic (congestive) and diastolic (congestive) heart failure: Secondary | ICD-10-CM | POA: Diagnosis not present

## 2015-01-24 DIAGNOSIS — I481 Persistent atrial fibrillation: Secondary | ICD-10-CM

## 2015-01-24 DIAGNOSIS — E1122 Type 2 diabetes mellitus with diabetic chronic kidney disease: Secondary | ICD-10-CM | POA: Diagnosis not present

## 2015-01-24 DIAGNOSIS — I4819 Other persistent atrial fibrillation: Secondary | ICD-10-CM

## 2015-01-24 DIAGNOSIS — I129 Hypertensive chronic kidney disease with stage 1 through stage 4 chronic kidney disease, or unspecified chronic kidney disease: Secondary | ICD-10-CM | POA: Diagnosis not present

## 2015-01-24 MED ORDER — FUROSEMIDE 40 MG PO TABS: 40.0000 mg | ORAL_TABLET | Freq: Two times a day (BID) | ORAL | Status: DC

## 2015-01-24 NOTE — Progress Notes (Signed)
01/24/2015 John Romero   06-Oct-1930  [id number]  Primary Physician Mathews Argyle, MD Primary Cardiologist: [name] MD [name]   HPI:  John Romero is an 82 year old moderately overweight widowed Caucasian male father of 2, grandfather to one grandchild is retired from working at [employer].I last saw him in the office 01/01/15. He is accompanied by his daughter [name] today. I saw him in the hospital when he was admitted with hypertensive encephalopathy. He has a history of hypertension, hyperlipidemia and type 2 diabetes. He has chronic kidney disease stage III and new onset A. Fib. He had coronary artery bypass grafting back in 1995. A 2-D echo during his hospitalization showed normal left ventricular function. He saw [name] [facility] in the office for his initial posthospital follow-up on 11/19/14 and was doing well at that time. She did have a discussion with him regarding beginning oral anticoagulation given his high CHA2DSVASC2 score (7) however the patient declined at that time wishing to remain on aspirin. Over the last several weeks prior to his December appointment he had progressive weight gain, dyspnea or resting shortness of breath. He was clearly in heart failure and 5 mg 3+ pitting edema. I doubled his diuretics. 2-D echo that showed a decline in his ejection fraction from 55-60% to 40-45% with a new wall motion abnormality. Over The last 3 weeks he has lost 13 pounds and is clinically improved.  Current Outpatient Prescriptions  Medication Sig Dispense Refill  . allopurinol (ZYLOPRIM) 300 MG tablet TAKE 1/2 TABLET DAILY TO PREVENT GOUT ATTACK 90 tablet 1  . amLODipine (NORVASC) 5 MG tablet Take 5 mg by mouth daily.    Marland Kitchen aspirin 325 MG EC tablet Take 325 mg by mouth daily.    . Blood Glucose Monitoring Suppl (TRUE METRIX AIR GLUCOSE METER) W/DEVICE KIT CHECK QAM ONCE A DAY  0  . cloNIDine (CATAPRES) 0.3 MG tablet Take one tablet by mouth twice daily for  blood pressure 60 tablet 0  . furosemide (LASIX) 40 MG tablet Take 1 tablet (40 mg total) by mouth 2 (two) times daily. 30 tablet 3  . insulin aspart (NOVOLOG) 100 UNIT/ML injection Inject 0-15 Units into the skin 3 (three) times daily with meals. Sliding scale  CBG 70 - 120: 0 units: CBG 121 - 150: 2 units; CBG 151 - 200: 3 units; CBG 201 - 250: 5 units; CBG 251 - 300: 8 units;CBG 301 - 350: 11 units; CBG 351 - 400: 15 units; CBG > 400 : 15 units and notify MD 10 mL 11  . Insulin Detemir (LEVEMIR) 100 UNIT/ML Pen Inject 38 Units into the skin daily.    . Insulin Pen Needle (B-D ULTRAFINE III SHORT PEN) 31G X 8 MM MISC Use 2 pen needles daily with the administration of Insulin. DX 250.00 200 each 3  . lisinopril (PRINIVIL,ZESTRIL) 10 MG tablet Take 1 tablet (10 mg total) by mouth daily. 30 tablet 11  . Metoprolol Tartrate 75 MG TABS Take 75 mg by mouth 2 (two) times daily.    . Multiple Vitamin (MULTIVITAMIN) tablet Take 1 tablet by mouth daily.    . potassium chloride 20 MEQ TBCR Take 20 mEq by mouth daily. 30 tablet 3  . simvastatin (ZOCOR) 20 MG tablet APPOINTMENT OVERDUE, 1 by mouth daily to lower cholesterol 90 tablet 0  . TRUE METRIX BLOOD GLUCOSE TEST test strip USE TO CHECK BLOOD SUGARS ONCE DAILY  11   No current facility-administered medications  for this visit.    No Known Allergies  Social History   Social History  . Marital Status: Married    Spouse Name: N/A  . Number of Children: N/A  . Years of Education: N/A   Occupational History  . Not on file.   Social History Main Topics  . Smoking status: Never Smoker   . Smokeless tobacco: Current User    Types: Chew  . Alcohol Use: No  . Drug Use: No  . Sexual Activity: Not on file     Comment: unknown   Other Topics Concern  . Not on file   Social History Narrative     Review of Systems: General: negative for chills, fever, night sweats or weight changes.  Cardiovascular: negative for chest pain, dyspnea on  exertion, edema, orthopnea, palpitations, paroxysmal nocturnal dyspnea or shortness of breath Dermatological: negative for rash Respiratory: negative for cough or wheezing Urologic: negative for hematuria Abdominal: negative for nausea, vomiting, diarrhea, bright red blood per rectum, melena, or hematemesis Neurologic: negative for visual changes, syncope, or dizziness All other systems reviewed and are otherwise negative except as noted above.    Blood pressure 144/80, pulse 80, height 5' 11"  (1.803 m), weight 202 lb 8 oz (91.853 kg).  General appearance: alert and no distress Neck: no adenopathy, no carotid bruit, no JVD, supple, symmetrical, trachea midline and thyroid not enlarged, symmetric, no tenderness/mass/nodules Lungs: clear to auscultation bilaterally Heart: regular rate and rhythm, S1, S2 normal, no murmur, click, rub or gallop Extremities: 2-3+ pitting edema left greater than right  EKG not performed today  ASSESSMENT AND PLAN:   Acute on chronic systolic and diastolic heart failure, NYHA class 3 ([place]) John Romero returns today for follow-up of his heart failure. I saw him 01/01/15. He had progressive weight gain, dyspnea on exertion or resting shortness of breath. I doubled his diuretic which has resulted in a  13 pound weight loss over the last 3 weeks with improvement in his symptoms. A 2-D echo revealed an ejection fraction of 40-45% which is significantly lower than the one by 2-D echo 11/01/14 at which time it was 55-60%. There was a wall motion abnormality.  I'm going to order a pharmacologic Myoview stress test to rule out an ischemic etiology. In the meantime, we talked about the importance of salt restriction. I'm going to maintain him on his current dose of diuretics.      [name] MD FACP,FACC,FAHA, [facility] 01/24/2015 3:34 PM

## 2015-01-24 NOTE — Patient Instructions (Signed)
Medication Instructions:  Your physician recommends that you continue on your current medications as directed. Please refer to the Current Medication list given to you today.   Labwork: I will get your lab work from your Primary Care Physician.   Testing/Procedures: Your physician has requested that you have a lexiscan myoview. For further information please visit [email or web address]. Please follow instruction sheet, as given.   Follow-Up: Your physician recommends that you schedule a follow-up appointment in: 4 weeks with [name], PA   Any Other Special Instructions Will Be Listed Below (If Applicable).     If you need a refill on your cardiac medications before your next appointment, please call your pharmacy.

## 2015-01-24 NOTE — Assessment & Plan Note (Addendum)
Mr. John Romero returns today for follow-up of his heart failure. I saw him 01/01/15. He had progressive weight gain, dyspnea on exertion or resting shortness of breath. I doubled his diuretic which has resulted in a  13 pound weight loss over the last 3 weeks with improvement in his symptoms. A 2-D echo revealed an ejection fraction of 40-45% which is significantly lower than the one by 2-D echo 11/01/14 at which time it was 55-60%. There was a wall motion abnormality.  I'm going to order a pharmacologic Myoview stress test to rule out an ischemic etiology. In the meantime, we talked about the importance of salt restriction. I'm going to maintain him on his current dose of diuretics.

## 2015-01-29 DIAGNOSIS — E1122 Type 2 diabetes mellitus with diabetic chronic kidney disease: Secondary | ICD-10-CM | POA: Diagnosis not present

## 2015-01-29 DIAGNOSIS — I129 Hypertensive chronic kidney disease with stage 1 through stage 4 chronic kidney disease, or unspecified chronic kidney disease: Secondary | ICD-10-CM | POA: Diagnosis not present

## 2015-01-30 DIAGNOSIS — E1122 Type 2 diabetes mellitus with diabetic chronic kidney disease: Secondary | ICD-10-CM | POA: Diagnosis not present

## 2015-01-30 DIAGNOSIS — I129 Hypertensive chronic kidney disease with stage 1 through stage 4 chronic kidney disease, or unspecified chronic kidney disease: Secondary | ICD-10-CM | POA: Diagnosis not present

## 2015-01-31 DIAGNOSIS — I129 Hypertensive chronic kidney disease with stage 1 through stage 4 chronic kidney disease, or unspecified chronic kidney disease: Secondary | ICD-10-CM | POA: Diagnosis not present

## 2015-01-31 DIAGNOSIS — E1122 Type 2 diabetes mellitus with diabetic chronic kidney disease: Secondary | ICD-10-CM | POA: Diagnosis not present

## 2015-02-05 ENCOUNTER — Telehealth (HOSPITAL_COMMUNITY): Payer: Self-pay

## 2015-02-05 ENCOUNTER — Encounter (HOSPITAL_COMMUNITY): Payer: Medicare Other

## 2015-02-05 DIAGNOSIS — I129 Hypertensive chronic kidney disease with stage 1 through stage 4 chronic kidney disease, or unspecified chronic kidney disease: Secondary | ICD-10-CM | POA: Diagnosis not present

## 2015-02-05 DIAGNOSIS — E1122 Type 2 diabetes mellitus with diabetic chronic kidney disease: Secondary | ICD-10-CM | POA: Diagnosis not present

## 2015-02-05 NOTE — Telephone Encounter (Signed)
Encounter complete. 

## 2015-02-06 ENCOUNTER — Ambulatory Visit (HOSPITAL_COMMUNITY)
Admission: RE | Admit: 2015-02-06 | Discharge: 2015-02-06 | Disposition: A | Discharge status: Home / Self Care | Payer: Medicare Other | Source: Ambulatory Visit | Attending: Cardiovascular Disease | Admitting: Cardiovascular Disease

## 2015-02-06 DIAGNOSIS — R0602 Shortness of breath: Secondary | ICD-10-CM | POA: Insufficient documentation

## 2015-02-06 DIAGNOSIS — I4819 Other persistent atrial fibrillation: Secondary | ICD-10-CM

## 2015-02-06 DIAGNOSIS — R5383 Other fatigue: Secondary | ICD-10-CM | POA: Diagnosis not present

## 2015-02-06 DIAGNOSIS — I481 Persistent atrial fibrillation: Secondary | ICD-10-CM | POA: Insufficient documentation

## 2015-02-06 DIAGNOSIS — I10 Essential (primary) hypertension: Secondary | ICD-10-CM | POA: Diagnosis not present

## 2015-02-06 DIAGNOSIS — F17220 Nicotine dependence, chewing tobacco, uncomplicated: Secondary | ICD-10-CM | POA: Insufficient documentation

## 2015-02-06 DIAGNOSIS — R55 Syncope and collapse: Secondary | ICD-10-CM | POA: Diagnosis not present

## 2015-02-06 LAB — MYOCARDIAL PERFUSION IMAGING
CHL CUP NUCLEAR SDS: 2
CHL CUP NUCLEAR SRS: 8
CHL CUP RESTING HR STRESS: 42 {beats}/min
LV sys vol: 72 mL
LVDIAVOL: 136 mL
Peak HR: 71 {beats}/min
SSS: 10
TID: 1.05

## 2015-02-06 MED ORDER — REGADENOSON 0.4 MG/5ML IV SOLN
0.4000 mg | Freq: Once | INTRAVENOUS | Status: AC
  Administered 2015-02-06: 0.4 mg via INTRAVENOUS

## 2015-02-06 MED ORDER — TECHNETIUM TC 99M SESTAMIBI GENERIC - CARDIOLITE
29.5000 | Freq: Once | INTRAVENOUS | Status: AC | PRN
  Administered 2015-02-06: 30 via INTRAVENOUS

## 2015-02-06 MED ORDER — TECHNETIUM TC 99M SESTAMIBI GENERIC - CARDIOLITE
10.9000 | Freq: Once | INTRAVENOUS | Status: AC | PRN
  Administered 2015-02-06: 10.9 via INTRAVENOUS

## 2015-02-07 DIAGNOSIS — I129 Hypertensive chronic kidney disease with stage 1 through stage 4 chronic kidney disease, or unspecified chronic kidney disease: Secondary | ICD-10-CM | POA: Diagnosis not present

## 2015-02-07 DIAGNOSIS — E1122 Type 2 diabetes mellitus with diabetic chronic kidney disease: Secondary | ICD-10-CM | POA: Diagnosis not present

## 2015-02-10 DIAGNOSIS — I739 Peripheral vascular disease, unspecified: Secondary | ICD-10-CM | POA: Diagnosis not present

## 2015-02-10 DIAGNOSIS — L603 Nail dystrophy: Secondary | ICD-10-CM | POA: Diagnosis not present

## 2015-02-10 DIAGNOSIS — E1051 Type 1 diabetes mellitus with diabetic peripheral angiopathy without gangrene: Secondary | ICD-10-CM | POA: Diagnosis not present

## 2015-02-11 ENCOUNTER — Ambulatory Visit (INDEPENDENT_AMBULATORY_CARE_PROVIDER_SITE_OTHER): Payer: Medicare Other | Admitting: Cardiovascular Disease

## 2015-02-11 ENCOUNTER — Encounter: Payer: Self-pay | Admitting: Cardiovascular Disease

## 2015-02-11 VITALS — BP 154/76 | HR 53 | Ht 71.0 in | Wt 200.0 lb

## 2015-02-11 DIAGNOSIS — I10 Essential (primary) hypertension: Secondary | ICD-10-CM | POA: Diagnosis not present

## 2015-02-11 DIAGNOSIS — E785 Hyperlipidemia, unspecified: Secondary | ICD-10-CM

## 2015-02-11 DIAGNOSIS — I480 Paroxysmal atrial fibrillation: Secondary | ICD-10-CM

## 2015-02-11 DIAGNOSIS — I5043 Acute on chronic combined systolic (congestive) and diastolic (congestive) heart failure: Secondary | ICD-10-CM

## 2015-02-11 MED ORDER — ASPIRIN EC 81 MG PO TBEC: 81.0000 mg | DELAYED_RELEASE_TABLET | Freq: Every day | ORAL | Status: AC

## 2015-02-11 MED ORDER — APIXABAN 5 MG PO TABS: 5.0000 mg | ORAL_TABLET | Freq: Two times a day (BID) | ORAL | Status: DC

## 2015-02-11 NOTE — Assessment & Plan Note (Signed)
History of paroxysmal fibrillation.The CHA2DSVASC2 score is  6 . We have talked about the risk of stroke with aspirin versus an oral anticoagulant and have decided to initiate treatment with a novel oral anticoagulant.

## 2015-02-11 NOTE — Assessment & Plan Note (Signed)
History of hyperlipidemia on simvastatin followed by his PCP 

## 2015-02-11 NOTE — Progress Notes (Signed)
02/11/2015 John Romero   04/20/1930  [id number]  Primary Physician Mathews Argyle, MD Primary Cardiologist: [name] MD [name]   HPI:  John Romero is an 82 year old moderately overweight widowed Caucasian male father of 2, grandfather to one grandchild is retired from working at [employer].I last saw him in the office 01/24/15. He is accompanied by his daughter [name] and wife today. I saw him in the hospital when he was admitted with hypertensive encephalopathy. He has a history of hypertension, hyperlipidemia and type 2 diabetes. He has chronic kidney disease stage III and new onset A. Fib. He had coronary artery bypass grafting back in 1995. A 2-D echo during his hospitalization showed normal left ventricular function. He saw [name] in the office for his initial posthospital follow-up on 11/19/14 and was doing well at that time. She did have a discussion with him regarding beginning oral anticoagulation given his high CHA2DSVASC2 score (7) however the patient declined at that time wishing to remain on aspirin. Over the last several weeks prior to his December appointment he had progressive weight gain, dyspnea or resting shortness of breath. He was clearly in heart failure and 5 mg 3+ pitting edema. I doubled his diuretics. 2-D echo that showed a decline in his ejection fraction from 55-60% to 40-45% with a new wall motion abnormality. He has slowly diuresed on oral diuretic. A Myoview stress test performed on 02/07/15 showed a small area of inferolateral scar with mild peri-infarct ischemia and an ejection fraction of 47%. He denies chest pain or shortness of breath. We have talked about initiating oral anticoagulant therapy which he agrees to..  Current Outpatient Prescriptions  Medication Sig Dispense Refill  . allopurinol (ZYLOPRIM) 300 MG tablet TAKE 1/2 TABLET DAILY TO PREVENT GOUT ATTACK 90 tablet 1  . amLODipine (NORVASC) 5 MG tablet Take 5 mg by mouth  daily.    Marland Kitchen aspirin 325 MG EC tablet Take 325 mg by mouth daily.    . Blood Glucose Monitoring Suppl (TRUE METRIX AIR GLUCOSE METER) W/DEVICE KIT CHECK QAM ONCE A DAY  0  . cloNIDine (CATAPRES) 0.3 MG tablet Take one tablet by mouth twice daily for blood pressure 60 tablet 0  . furosemide (LASIX) 40 MG tablet Take 1 tablet (40 mg total) by mouth 2 (two) times daily. 30 tablet 3  . insulin aspart (NOVOLOG) 100 UNIT/ML injection Inject 0-15 Units into the skin 3 (three) times daily with meals. Sliding scale  CBG 70 - 120: 0 units: CBG 121 - 150: 2 units; CBG 151 - 200: 3 units; CBG 201 - 250: 5 units; CBG 251 - 300: 8 units;CBG 301 - 350: 11 units; CBG 351 - 400: 15 units; CBG > 400 : 15 units and notify MD 10 mL 11  . Insulin Detemir (LEVEMIR) 100 UNIT/ML Pen Inject 38 Units into the skin daily.    . Insulin Pen Needle (B-D ULTRAFINE III SHORT PEN) 31G X 8 MM MISC Use 2 pen needles daily with the administration of Insulin. DX 250.00 200 each 3  . lisinopril (PRINIVIL,ZESTRIL) 10 MG tablet Take 1 tablet (10 mg total) by mouth daily. 30 tablet 11  . Metoprolol Tartrate 75 MG TABS Take 75 mg by mouth 2 (two) times daily.    . Multiple Vitamin (MULTIVITAMIN) tablet Take 1 tablet by mouth daily.    . potassium chloride 20 MEQ TBCR Take 20 mEq by mouth daily. 30 tablet 3  . simvastatin (ZOCOR)  20 MG tablet APPOINTMENT OVERDUE, 1 by mouth daily to lower cholesterol 90 tablet 0  . TRUE METRIX BLOOD GLUCOSE TEST test strip USE TO CHECK BLOOD SUGARS ONCE DAILY  11   No current facility-administered medications for this visit.    No Known Allergies  Social History   Social History  . Marital Status: Married    Spouse Name: N/A  . Number of Children: N/A  . Years of Education: N/A   Occupational History  . Not on file.   Social History Main Topics  . Smoking status: Never Smoker   . Smokeless tobacco: Current User    Types: Chew  . Alcohol Use: No  . Drug Use: No  . Sexual Activity: Not on  file     Comment: unknown   Other Topics Concern  . Not on file   Social History Narrative     Review of Systems: General: negative for chills, fever, night sweats or weight changes.  Cardiovascular: negative for chest pain, dyspnea on exertion, edema, orthopnea, palpitations, paroxysmal nocturnal dyspnea or shortness of breath Dermatological: negative for rash Respiratory: negative for cough or wheezing Urologic: negative for hematuria Abdominal: negative for nausea, vomiting, diarrhea, bright red blood per rectum, melena, or hematemesis Neurologic: negative for visual changes, syncope, or dizziness All other systems reviewed and are otherwise negative except as noted above.    Blood pressure 154/76, pulse 53, height 5' 11"  (1.803 m), weight 200 lb (90.719 kg).  General appearance: alert and no distress Neck: no adenopathy, no carotid bruit, no JVD, supple, symmetrical, trachea midline and thyroid not enlarged, symmetric, no tenderness/mass/nodules Lungs: clear to auscultation bilaterally Heart: regular rate and rhythm, S1, S2 normal, no murmur, click, rub or gallop Extremities: 2-3+ pitting edema  EKG not performed today  ASSESSMENT AND PLAN:   Hyperlipidemia LDL goal <100 History of hyperlipidemia on simvastatin followed by his PCP  Essential hypertension History of hypertension blood pressure measured at 154/76. He is on amlodipine, lisinopril, clonidine and metoprolol. Continue current meds at current dosing  PAF (paroxysmal atrial fibrillation) (HCC) History of paroxysmal fibrillation.The CHA2DSVASC2 score is  6 . We have talked about the risk of stroke with aspirin versus an oral anticoagulant and have decided to initiate treatment with a novel oral anticoagulant.  CAD (coronary artery disease) of artery bypass graft History of coronary artery disease status post bypass grafting in 1995. A recent Myoview stress test performed 02/07/15 showed a small area of  inferolateral scar with mild peri-infarct ischemia which I thought was low risk. His EF was 47%. At this point, I do not feel compelled to proceed with cardiac catheterization but would rather treat him medically  Acute on chronic systolic and diastolic heart failure, NYHA class 3 (HCC) Combined chronic systolic and diastolic heart failure with an ejection fraction in the 45-50% range. He is on oral diuretics as well as beta blocker and ACE inhibitor. He does have 2+ pitting edema but apparently this is slowly improving. He denies chest pain or shortness of breath.      [name] MD FACP,FACC,FAHA, [facility] 02/11/2015 12:26 PM

## 2015-02-11 NOTE — Assessment & Plan Note (Signed)
History of hypertension blood pressure measured at 154/76. He is on amlodipine, lisinopril, clonidine and metoprolol. Continue current meds at current dosing

## 2015-02-11 NOTE — Assessment & Plan Note (Signed)
History of coronary artery disease status post bypass grafting in 1995. A recent Myoview stress test performed 02/07/15 showed a small area of inferolateral scar with mild peri-infarct ischemia which I thought was low risk. His EF was 47%. At this point, I do not feel compelled to proceed with cardiac catheterization but would rather treat him medically

## 2015-02-11 NOTE — Patient Instructions (Addendum)
Medication Instructions:  Your physician has recommended you make the following change in your medication:  1) START Eliquis 5 mg by mouth TWICE daily 2) DECREASE Asprin to 81 mg tablet by mouth ONCE daily   Labwork: none  Testing/Procedures: none  Follow-Up: We request that you follow-up in: 3 months with [name], PA  and in 6 months with Dr Andria Rhein will receive a reminder letter in the mail two months in advance. If you don't receive a letter, please call our office to schedule the follow-up appointment.    Any Other Special Instructions Will Be Listed Below (If Applicable).     If you need a refill on your cardiac medications before your next appointment, please call your pharmacy.

## 2015-02-11 NOTE — Assessment & Plan Note (Signed)
Combined chronic systolic and diastolic heart failure with an ejection fraction in the 45-50% range. He is on oral diuretics as well as beta blocker and ACE inhibitor. He does have 2+ pitting edema but apparently this is slowly improving. He denies chest pain or shortness of breath.

## 2015-02-12 DIAGNOSIS — I129 Hypertensive chronic kidney disease with stage 1 through stage 4 chronic kidney disease, or unspecified chronic kidney disease: Secondary | ICD-10-CM | POA: Diagnosis not present

## 2015-02-12 DIAGNOSIS — E1122 Type 2 diabetes mellitus with diabetic chronic kidney disease: Secondary | ICD-10-CM | POA: Diagnosis not present

## 2015-02-14 ENCOUNTER — Other Ambulatory Visit: Payer: Self-pay

## 2015-02-14 DIAGNOSIS — I129 Hypertensive chronic kidney disease with stage 1 through stage 4 chronic kidney disease, or unspecified chronic kidney disease: Secondary | ICD-10-CM | POA: Diagnosis not present

## 2015-02-14 DIAGNOSIS — E1122 Type 2 diabetes mellitus with diabetic chronic kidney disease: Secondary | ICD-10-CM | POA: Diagnosis not present

## 2015-02-14 MED ORDER — APIXABAN 5 MG PO TABS: 5.0000 mg | ORAL_TABLET | Freq: Two times a day (BID) | ORAL | Status: DC

## 2015-02-14 NOTE — Progress Notes (Signed)
Pt Eliquis RX sent to [facility] mail order pharmacy per request during last OV when medication was started.

## 2015-02-17 ENCOUNTER — Other Ambulatory Visit: Payer: Self-pay | Admitting: Pharmacist Clinician (PhC)/ Clinical Pharmacy Specialist

## 2015-02-17 MED ORDER — APIXABAN 5 MG PO TABS: 5.0000 mg | ORAL_TABLET | Freq: Two times a day (BID) | ORAL | Status: DC

## 2015-02-17 NOTE — Telephone Encounter (Signed)
New mail order phamacy [facility]

## 2015-02-18 DIAGNOSIS — E1122 Type 2 diabetes mellitus with diabetic chronic kidney disease: Secondary | ICD-10-CM | POA: Diagnosis not present

## 2015-02-18 DIAGNOSIS — I129 Hypertensive chronic kidney disease with stage 1 through stage 4 chronic kidney disease, or unspecified chronic kidney disease: Secondary | ICD-10-CM | POA: Diagnosis not present

## 2015-02-20 DIAGNOSIS — I129 Hypertensive chronic kidney disease with stage 1 through stage 4 chronic kidney disease, or unspecified chronic kidney disease: Secondary | ICD-10-CM | POA: Diagnosis not present

## 2015-02-20 DIAGNOSIS — E1122 Type 2 diabetes mellitus with diabetic chronic kidney disease: Secondary | ICD-10-CM | POA: Diagnosis not present

## 2015-02-24 DIAGNOSIS — E1121 Type 2 diabetes mellitus with diabetic nephropathy: Secondary | ICD-10-CM | POA: Diagnosis not present

## 2015-02-24 DIAGNOSIS — N183 Chronic kidney disease, stage 3 (moderate): Secondary | ICD-10-CM | POA: Diagnosis not present

## 2015-02-24 DIAGNOSIS — Z794 Long term (current) use of insulin: Secondary | ICD-10-CM | POA: Diagnosis not present

## 2015-02-24 DIAGNOSIS — I4891 Unspecified atrial fibrillation: Secondary | ICD-10-CM | POA: Diagnosis not present

## 2015-02-24 DIAGNOSIS — I129 Hypertensive chronic kidney disease with stage 1 through stage 4 chronic kidney disease, or unspecified chronic kidney disease: Secondary | ICD-10-CM | POA: Diagnosis not present

## 2015-02-25 ENCOUNTER — Ambulatory Visit: Payer: Medicare Other | Admitting: Cardiology

## 2015-02-25 DIAGNOSIS — E1122 Type 2 diabetes mellitus with diabetic chronic kidney disease: Secondary | ICD-10-CM | POA: Diagnosis not present

## 2015-02-25 DIAGNOSIS — I129 Hypertensive chronic kidney disease with stage 1 through stage 4 chronic kidney disease, or unspecified chronic kidney disease: Secondary | ICD-10-CM | POA: Diagnosis not present

## 2015-03-03 DIAGNOSIS — H04123 Dry eye syndrome of bilateral lacrimal glands: Secondary | ICD-10-CM | POA: Diagnosis not present

## 2015-03-03 DIAGNOSIS — H52203 Unspecified astigmatism, bilateral: Secondary | ICD-10-CM | POA: Diagnosis not present

## 2015-03-03 DIAGNOSIS — H01001 Unspecified blepharitis right upper eyelid: Secondary | ICD-10-CM | POA: Diagnosis not present

## 2015-03-03 DIAGNOSIS — E119 Type 2 diabetes mellitus without complications: Secondary | ICD-10-CM | POA: Diagnosis not present

## 2015-03-04 DIAGNOSIS — E1122 Type 2 diabetes mellitus with diabetic chronic kidney disease: Secondary | ICD-10-CM | POA: Diagnosis not present

## 2015-03-04 DIAGNOSIS — I129 Hypertensive chronic kidney disease with stage 1 through stage 4 chronic kidney disease, or unspecified chronic kidney disease: Secondary | ICD-10-CM | POA: Diagnosis not present

## 2015-03-11 DIAGNOSIS — I129 Hypertensive chronic kidney disease with stage 1 through stage 4 chronic kidney disease, or unspecified chronic kidney disease: Secondary | ICD-10-CM | POA: Diagnosis not present

## 2015-03-11 DIAGNOSIS — E1122 Type 2 diabetes mellitus with diabetic chronic kidney disease: Secondary | ICD-10-CM | POA: Diagnosis not present

## 2015-03-24 DIAGNOSIS — Z79899 Other long term (current) drug therapy: Secondary | ICD-10-CM | POA: Diagnosis not present

## 2015-03-24 DIAGNOSIS — Z794 Long term (current) use of insulin: Secondary | ICD-10-CM | POA: Diagnosis not present

## 2015-03-24 DIAGNOSIS — N183 Chronic kidney disease, stage 3 (moderate): Secondary | ICD-10-CM | POA: Diagnosis not present

## 2015-03-24 DIAGNOSIS — E1140 Type 2 diabetes mellitus with diabetic neuropathy, unspecified: Secondary | ICD-10-CM | POA: Diagnosis not present

## 2015-03-24 DIAGNOSIS — E7800 Pure hypercholesterolemia, unspecified: Secondary | ICD-10-CM | POA: Diagnosis not present

## 2015-03-24 DIAGNOSIS — I480 Paroxysmal atrial fibrillation: Secondary | ICD-10-CM | POA: Diagnosis not present

## 2015-03-24 DIAGNOSIS — E1121 Type 2 diabetes mellitus with diabetic nephropathy: Secondary | ICD-10-CM | POA: Diagnosis not present

## 2015-03-24 DIAGNOSIS — I129 Hypertensive chronic kidney disease with stage 1 through stage 4 chronic kidney disease, or unspecified chronic kidney disease: Secondary | ICD-10-CM | POA: Diagnosis not present

## 2015-04-14 DIAGNOSIS — E1121 Type 2 diabetes mellitus with diabetic nephropathy: Secondary | ICD-10-CM | POA: Diagnosis not present

## 2015-04-14 DIAGNOSIS — I129 Hypertensive chronic kidney disease with stage 1 through stage 4 chronic kidney disease, or unspecified chronic kidney disease: Secondary | ICD-10-CM | POA: Diagnosis not present

## 2015-04-14 DIAGNOSIS — Z7984 Long term (current) use of oral hypoglycemic drugs: Secondary | ICD-10-CM | POA: Diagnosis not present

## 2015-04-14 DIAGNOSIS — N183 Chronic kidney disease, stage 3 (moderate): Secondary | ICD-10-CM | POA: Diagnosis not present

## 2015-04-18 DIAGNOSIS — Z1389 Encounter for screening for other disorder: Secondary | ICD-10-CM | POA: Diagnosis not present

## 2015-04-18 DIAGNOSIS — Z0000 Encounter for general adult medical examination without abnormal findings: Secondary | ICD-10-CM | POA: Diagnosis not present

## 2015-04-21 DIAGNOSIS — I739 Peripheral vascular disease, unspecified: Secondary | ICD-10-CM | POA: Diagnosis not present

## 2015-04-21 DIAGNOSIS — E1151 Type 2 diabetes mellitus with diabetic peripheral angiopathy without gangrene: Secondary | ICD-10-CM | POA: Diagnosis not present

## 2015-04-21 DIAGNOSIS — L603 Nail dystrophy: Secondary | ICD-10-CM | POA: Diagnosis not present

## 2015-04-30 ENCOUNTER — Encounter: Payer: Self-pay | Admitting: Cardiology

## 2015-04-30 ENCOUNTER — Ambulatory Visit (INDEPENDENT_AMBULATORY_CARE_PROVIDER_SITE_OTHER): Payer: Medicare Other | Admitting: Cardiology

## 2015-04-30 VITALS — Ht 71.0 in | Wt 197.0 lb

## 2015-04-30 DIAGNOSIS — I4891 Unspecified atrial fibrillation: Secondary | ICD-10-CM | POA: Insufficient documentation

## 2015-04-30 DIAGNOSIS — I110 Hypertensive heart disease with heart failure: Secondary | ICD-10-CM | POA: Diagnosis not present

## 2015-04-30 DIAGNOSIS — I5042 Chronic combined systolic (congestive) and diastolic (congestive) heart failure: Secondary | ICD-10-CM | POA: Diagnosis not present

## 2015-04-30 DIAGNOSIS — N183 Chronic kidney disease, stage 3 unspecified: Secondary | ICD-10-CM

## 2015-04-30 DIAGNOSIS — E1121 Type 2 diabetes mellitus with diabetic nephropathy: Secondary | ICD-10-CM

## 2015-04-30 DIAGNOSIS — I4821 Permanent atrial fibrillation: Secondary | ICD-10-CM

## 2015-04-30 DIAGNOSIS — I119 Hypertensive heart disease without heart failure: Secondary | ICD-10-CM | POA: Insufficient documentation

## 2015-04-30 DIAGNOSIS — I10 Essential (primary) hypertension: Secondary | ICD-10-CM

## 2015-04-30 DIAGNOSIS — I482 Chronic atrial fibrillation: Secondary | ICD-10-CM

## 2015-04-30 DIAGNOSIS — Z7901 Long term (current) use of anticoagulants: Secondary | ICD-10-CM

## 2015-04-30 NOTE — Assessment & Plan Note (Signed)
Last SCr 1.17

## 2015-04-30 NOTE — Patient Instructions (Signed)
Your physician wants you to follow-up in: 6 Months with Dr Berry. You will receive a reminder letter in the mail two months in advance. If you don't receive a letter, please call our office to schedule the follow-up appointment.    

## 2015-04-30 NOTE — Assessment & Plan Note (Signed)
Type 2 IDDM 

## 2015-04-30 NOTE — Assessment & Plan Note (Signed)
CABG '95, Myoview low risk Jan 2017 No chest pain

## 2015-04-30 NOTE — Assessment & Plan Note (Signed)
Controlled.  

## 2015-04-30 NOTE — Assessment & Plan Note (Signed)
Eliquis 

## 2015-04-30 NOTE — Progress Notes (Signed)
04/30/2015 John Romero   05-Mar-1930  [id number]  Primary Physician John Argyle, MD Primary Cardiologist: John John Romero  HPI:  82 y/o male with a history of remote CABG '95, low risk Myoview Jan 2017, HTN with HCVD- EF 40-50% by echo Dec 2016, and AF diagnosed in Oct 2016 which now appears to be permanent. He was not sure why he had this appointment today- he saw John John Romero in Jan. He has had no complaints or changes in his history since then. His EKG shows AF with slow VR- similar to Dec 2016. He denies any syncope or near syncope. Upon reviewing his medications with him it became clear he is not really sure what doses he is taking. He did not bring his medications with him.    Current Outpatient Prescriptions  Medication Sig Dispense Refill  . allopurinol (ZYLOPRIM) 300 MG tablet TAKE 1/2 TABLET DAILY TO PREVENT GOUT ATTACK 90 tablet 1  . amLODipine (NORVASC) 5 MG tablet Take 5 mg by mouth daily.    Marland Kitchen apixaban (ELIQUIS) 5 MG TABS tablet Take 1 tablet (5 mg total) by mouth 2 (two) times daily. 180 tablet 3  . aspirin EC 81 MG tablet Take 1 tablet (81 mg total) by mouth daily. 90 tablet 3  . Blood Glucose Monitoring Suppl (TRUE METRIX AIR GLUCOSE METER) W/DEVICE KIT CHECK QAM ONCE A DAY  0  . cloNIDine (CATAPRES) 0.3 MG tablet Take one tablet by mouth twice daily for blood pressure 60 tablet 0  . furosemide (LASIX) 40 MG tablet Take 1 tablet (40 mg total) by mouth 2 (two) times daily. 30 tablet 3  . insulin aspart (NOVOLOG) 100 UNIT/ML injection Inject 0-15 Units into the skin 3 (three) times daily with meals. Sliding scale  CBG 70 - 120: 0 units: CBG 121 - 150: 2 units; CBG 151 - 200: 3 units; CBG 201 - 250: 5 units; CBG 251 - 300: 8 units;CBG 301 - 350: 11 units; CBG 351 - 400: 15 units; CBG > 400 : 15 units and notify MD 10 mL 11  . Insulin Detemir (LEVEMIR) 100 UNIT/ML Pen Inject 38 Units into the skin daily.    . Insulin Pen Needle (B-D ULTRAFINE III SHORT PEN) 31G X 8 MM MISC Use 2  pen needles daily with the administration of Insulin. DX 250.00 200 each 3  . lisinopril (PRINIVIL,ZESTRIL) 10 MG tablet Take 1 tablet (10 mg total) by mouth daily. 30 tablet 11  . Metoprolol Tartrate 75 MG TABS Take 75 mg by mouth 2 (two) times daily.    . Multiple Vitamin (MULTIVITAMIN) tablet Take 1 tablet by mouth daily.    . simvastatin (ZOCOR) 20 MG tablet APPOINTMENT OVERDUE, 1 by mouth daily to lower cholesterol 90 tablet 0  . spironolactone (ALDACTONE) 25 MG tablet Take 25 mg by mouth daily.    . TRUE METRIX BLOOD GLUCOSE TEST test strip USE TO CHECK BLOOD SUGARS ONCE DAILY  11   No current facility-administered medications for this visit.    No Known Allergies  Social History   Social History  . Marital Status: Married    Spouse Name: N/A  . Number of Children: N/A  . Years of Education: N/A   Occupational History  . Not on file.   Social History Main Topics  . Smoking status: Never Smoker   . Smokeless tobacco: Current User    Types: Chew  . Alcohol Use: No  . Drug Use: No  . Sexual Activity:  Not on file     Comment: unknown   Other Topics Concern  . Not on file   Social History Narrative     Review of Systems: General: negative for chills, fever, night sweats or weight changes.  Cardiovascular: negative for chest pain, dyspnea on exertion, edema, orthopnea, palpitations, paroxysmal nocturnal dyspnea or shortness of breath Dermatological: negative for rash Respiratory: negative for cough or wheezing Urologic: negative for hematuria Abdominal: negative for nausea, vomiting, diarrhea, bright red blood per rectum, melena, or hematemesis Neurologic: negative for visual changes, syncope, or dizziness All other systems reviewed and are otherwise negative except as noted above.    Height 5' 11"  (1.803 m), weight 197 lb (89.359 kg).  General appearance: alert, cooperative and no distress Neck: no carotid bruit and no JVD Lungs: velcro crackles Rt base Heart:  irregularly irregular rhythm Extremities: 1-2+ edema bilateraly Skin: Skin color, texture, turgor normal. 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 His rate is slow but he is asymptomatic CHADs VASc= 8 for age, CHF, HTN, DM, H/O CVA, and vascular disease  Chronic anticoagulation Eliquis   PLAN  I have asked John Romero and his family member to call us later today to clarify his does. I considered cutting back on his metoprolol but he doesn't seem to be symptomatic and his EKG looks the same as Dec 2016. F/U with his PCP-John Romero and John John Romero in 6 months.   John Romero K PA-C 04/30/2015 12:24 PM

## 2015-04-30 NOTE — Assessment & Plan Note (Signed)
This appears to be permanent. His rate is slow but he is asymptomatic CHADs VASc= 8 for age, CHF, HTN, DM, H/O CVA, and vascular disease

## 2015-04-30 NOTE — Assessment & Plan Note (Signed)
He appears compensated today

## 2015-05-01 ENCOUNTER — Telehealth: Payer: Self-pay | Admitting: Cardiovascular Disease

## 2015-05-01 NOTE — Telephone Encounter (Signed)
LM per DPR with [name], PA's advice.

## 2015-05-01 NOTE — Telephone Encounter (Signed)
Same treatment for now.

## 2015-05-01 NOTE — Telephone Encounter (Signed)
New message    Patient verbalize he wanted the nurse to call him back  - did not disclose any information

## 2015-05-01 NOTE — Telephone Encounter (Signed)
Spoke with patient who states he was supposed to call today and clarify dose of lopressor Patient takes metoprolol 75mg  BID  Message routed to [place], [place]

## 2015-06-13 DIAGNOSIS — Z794 Long term (current) use of insulin: Secondary | ICD-10-CM | POA: Diagnosis not present

## 2015-06-13 DIAGNOSIS — I129 Hypertensive chronic kidney disease with stage 1 through stage 4 chronic kidney disease, or unspecified chronic kidney disease: Secondary | ICD-10-CM | POA: Diagnosis not present

## 2015-06-13 DIAGNOSIS — I480 Paroxysmal atrial fibrillation: Secondary | ICD-10-CM | POA: Diagnosis not present

## 2015-06-13 DIAGNOSIS — E1140 Type 2 diabetes mellitus with diabetic neuropathy, unspecified: Secondary | ICD-10-CM | POA: Diagnosis not present

## 2015-06-13 DIAGNOSIS — N183 Chronic kidney disease, stage 3 (moderate): Secondary | ICD-10-CM | POA: Diagnosis not present

## 2015-06-13 DIAGNOSIS — E1121 Type 2 diabetes mellitus with diabetic nephropathy: Secondary | ICD-10-CM | POA: Diagnosis not present

## 2015-06-13 DIAGNOSIS — E1165 Type 2 diabetes mellitus with hyperglycemia: Secondary | ICD-10-CM | POA: Diagnosis not present

## 2015-07-02 DIAGNOSIS — L603 Nail dystrophy: Secondary | ICD-10-CM | POA: Diagnosis not present

## 2015-07-02 DIAGNOSIS — I739 Peripheral vascular disease, unspecified: Secondary | ICD-10-CM | POA: Diagnosis not present

## 2015-07-02 DIAGNOSIS — E1151 Type 2 diabetes mellitus with diabetic peripheral angiopathy without gangrene: Secondary | ICD-10-CM | POA: Diagnosis not present

## 2015-08-15 DIAGNOSIS — R739 Hyperglycemia, unspecified: Secondary | ICD-10-CM | POA: Diagnosis not present

## 2015-08-15 DIAGNOSIS — Z794 Long term (current) use of insulin: Secondary | ICD-10-CM | POA: Diagnosis not present

## 2015-08-15 DIAGNOSIS — N183 Chronic kidney disease, stage 3 (moderate): Secondary | ICD-10-CM | POA: Diagnosis not present

## 2015-08-15 DIAGNOSIS — E1121 Type 2 diabetes mellitus with diabetic nephropathy: Secondary | ICD-10-CM | POA: Diagnosis not present

## 2015-08-15 DIAGNOSIS — E1140 Type 2 diabetes mellitus with diabetic neuropathy, unspecified: Secondary | ICD-10-CM | POA: Diagnosis not present

## 2015-08-15 DIAGNOSIS — I129 Hypertensive chronic kidney disease with stage 1 through stage 4 chronic kidney disease, or unspecified chronic kidney disease: Secondary | ICD-10-CM | POA: Diagnosis not present

## 2015-08-15 DIAGNOSIS — I4891 Unspecified atrial fibrillation: Secondary | ICD-10-CM | POA: Diagnosis not present

## 2015-08-15 DIAGNOSIS — E1165 Type 2 diabetes mellitus with hyperglycemia: Secondary | ICD-10-CM | POA: Diagnosis not present

## 2015-08-15 DIAGNOSIS — Z79899 Other long term (current) drug therapy: Secondary | ICD-10-CM | POA: Diagnosis not present

## 2015-09-10 DIAGNOSIS — N183 Chronic kidney disease, stage 3 (moderate): Secondary | ICD-10-CM | POA: Diagnosis not present

## 2015-09-10 DIAGNOSIS — Z794 Long term (current) use of insulin: Secondary | ICD-10-CM | POA: Diagnosis not present

## 2015-09-10 DIAGNOSIS — E1165 Type 2 diabetes mellitus with hyperglycemia: Secondary | ICD-10-CM | POA: Diagnosis not present

## 2015-09-18 DIAGNOSIS — S91109A Unspecified open wound of unspecified toe(s) without damage to nail, initial encounter: Secondary | ICD-10-CM | POA: Diagnosis not present

## 2015-09-18 DIAGNOSIS — L97512 Non-pressure chronic ulcer of other part of right foot with fat layer exposed: Secondary | ICD-10-CM | POA: Diagnosis not present

## 2015-10-03 DIAGNOSIS — L97511 Non-pressure chronic ulcer of other part of right foot limited to breakdown of skin: Secondary | ICD-10-CM | POA: Diagnosis not present

## 2015-10-03 DIAGNOSIS — M2041 Other hammer toe(s) (acquired), right foot: Secondary | ICD-10-CM | POA: Diagnosis not present

## 2015-10-15 DIAGNOSIS — N183 Chronic kidney disease, stage 3 (moderate): Secondary | ICD-10-CM | POA: Diagnosis not present

## 2015-10-15 DIAGNOSIS — Z794 Long term (current) use of insulin: Secondary | ICD-10-CM | POA: Diagnosis not present

## 2015-10-15 DIAGNOSIS — Z23 Encounter for immunization: Secondary | ICD-10-CM | POA: Diagnosis not present

## 2015-10-15 DIAGNOSIS — E1165 Type 2 diabetes mellitus with hyperglycemia: Secondary | ICD-10-CM | POA: Diagnosis not present

## 2015-10-20 DIAGNOSIS — L97512 Non-pressure chronic ulcer of other part of right foot with fat layer exposed: Secondary | ICD-10-CM | POA: Diagnosis not present

## 2015-10-29 ENCOUNTER — Encounter: Payer: Self-pay | Admitting: Cardiovascular Disease

## 2015-10-29 ENCOUNTER — Ambulatory Visit (INDEPENDENT_AMBULATORY_CARE_PROVIDER_SITE_OTHER): Payer: Medicare Other | Admitting: Cardiovascular Disease

## 2015-10-29 VITALS — BP 116/54 | HR 51 | Ht 71.0 in | Wt 196.0 lb

## 2015-10-29 DIAGNOSIS — I482 Chronic atrial fibrillation: Secondary | ICD-10-CM

## 2015-10-29 DIAGNOSIS — I480 Paroxysmal atrial fibrillation: Secondary | ICD-10-CM

## 2015-10-29 DIAGNOSIS — I5043 Acute on chronic combined systolic (congestive) and diastolic (congestive) heart failure: Secondary | ICD-10-CM

## 2015-10-29 DIAGNOSIS — E785 Hyperlipidemia, unspecified: Secondary | ICD-10-CM | POA: Diagnosis not present

## 2015-10-29 DIAGNOSIS — I10 Essential (primary) hypertension: Secondary | ICD-10-CM

## 2015-10-29 DIAGNOSIS — I4821 Permanent atrial fibrillation: Secondary | ICD-10-CM

## 2015-10-29 DIAGNOSIS — Z951 Presence of aortocoronary bypass graft: Secondary | ICD-10-CM | POA: Diagnosis not present

## 2015-10-29 DIAGNOSIS — R001 Bradycardia, unspecified: Secondary | ICD-10-CM

## 2015-10-29 DIAGNOSIS — R600 Localized edema: Secondary | ICD-10-CM

## 2015-10-29 NOTE — Assessment & Plan Note (Signed)
History of atrial fibrillation now chronic on Eliquis oral anticoagulation.

## 2015-10-29 NOTE — Assessment & Plan Note (Signed)
History of coronary artery disease status post coronary artery bypass grafting back in 1995. He has had normal LV function until recently when an EF demonstrated by 2-D was 40-45% representing a small decline compared to prior EF of 35-60%. A Myoview stress test performed 02/07/15 showed a small area of inferolateral scar with mild peri-infarct ischemia and an EF of 47%. He denies chest pain or shortness of breath. We elected not to proceed with cardiac catheterization at that time.

## 2015-10-29 NOTE — Patient Instructions (Addendum)
Medication Instructions:  NO CHANGES.  Labwork: Labwork will be requested from your primary care physician.   Follow-Up: We request that you follow-up in: 6 MONTHS with an extender and in 12 MONTHS with Dr Andria Rhein will receive a reminder letter in the mail two months in advance. If you don't receive a letter, please call our office to schedule the follow-up appointment.   If you need a refill on your cardiac medications before your next appointment, please call your pharmacy.

## 2015-10-29 NOTE — Assessment & Plan Note (Signed)
Chronic bradycardia related to A. fib with a slow ventricular response as well as junctional bradycardia. He is not on any rate slowing agents. His major complaint is of fatigue. We talked about the possibility of a permanent transvenous pacemaker which she has declined.

## 2015-10-29 NOTE — Assessment & Plan Note (Signed)
Combined chronic systolic and diastolic heart failure now compensated on diuretic therapy.

## 2015-10-29 NOTE — Assessment & Plan Note (Signed)
Chronic bilateral lower extremity edema left greater than right. Exam he has minimal right ankle edema edema 1-2+ left pretibial edema. He does watch his salt intake and is on Aldactone as well as furosemide.

## 2015-10-29 NOTE — Assessment & Plan Note (Signed)
History of hyperlipidemia on statin therapy followed by his PCP 

## 2015-10-29 NOTE — Assessment & Plan Note (Signed)
History of hypertension blood pressure measured at 116/54. He is on clonidine and lisinopril. Continue current meds at current dosing

## 2015-10-29 NOTE — Progress Notes (Signed)
10/29/2015 John Romero   March 31, 1930  [id number]  Primary Physician Mathews Argyle, MD Primary Cardiologist: [name] MD [name]  HPI:  John Romero is an 82 year old moderately overweight widowed Caucasian male father of 2, grandfather to one grandchild is retired from working at [employer].I last saw him in the office 02/11/15. He is accompanied by his daughter [name] and wife today. I saw him in the hospital when he was admitted with hypertensive encephalopathy. He has a history of hypertension, hyperlipidemia and type 2 diabetes. He has chronic kidney disease stage III and new onset A. Fib. He had coronary artery bypass grafting back in 1995. A 2-D echo during his hospitalization showed normal left ventricular function. He saw [name] in the office for his initial posthospital follow-up on 11/19/14 and was doing well at that time. She did have a discussion with him regarding beginning oral anticoagulation given his high CHA2DSVASC2 score (7) however the patient declined at that time wishing to remain on aspirin. Over the last several weeks prior to his December appointment he had progressive weight gain, dyspnea or resting shortness of breath. He was clearly in heart failure and 5 mg 3+ pitting edema. I doubled his diuretics. 2-D echo that showed a decline in his ejection fraction from 55-60% to 40-45% with a new wall motion abnormality. He has slowly diuresed on oral diuretic. A Myoview stress test performed on 02/07/15 showed a small area of inferolateral scar with mild peri-infarct ischemia and an ejection fraction of 47%.  We have talked about initiating oral anticoagulant therapy which he agreed to initiate. We began him on Eliquis . He's been well since. He denies chest pain or shortness of breath.   Current Outpatient Prescriptions  Medication Sig Dispense Refill  . allopurinol (ZYLOPRIM) 300 MG tablet TAKE 1/2 TABLET DAILY TO PREVENT GOUT ATTACK 90  tablet 1  . apixaban (ELIQUIS) 5 MG TABS tablet Take 1 tablet (5 mg total) by mouth 2 (two) times daily. 180 tablet 3  . aspirin EC 81 MG tablet Take 1 tablet (81 mg total) by mouth daily. 90 tablet 3  . Blood Glucose Monitoring Suppl (TRUE METRIX AIR GLUCOSE METER) W/DEVICE KIT CHECK QAM ONCE A DAY  0  . cloNIDine (CATAPRES) 0.3 MG tablet Take one tablet by mouth twice daily for blood pressure 60 tablet 0  . furosemide (LASIX) 40 MG tablet Take 1 tablet (40 mg total) by mouth 2 (two) times daily. 30 tablet 3  . insulin aspart (NOVOLOG) 100 UNIT/ML injection Inject 0-15 Units into the skin 3 (three) times daily with meals. Sliding scale  CBG 70 - 120: 0 units: CBG 121 - 150: 2 units; CBG 151 - 200: 3 units; CBG 201 - 250: 5 units; CBG 251 - 300: 8 units;CBG 301 - 350: 11 units; CBG 351 - 400: 15 units; CBG > 400 : 15 units and notify MD 10 mL 11  . Insulin Detemir (LEVEMIR) 100 UNIT/ML Pen Inject 38 Units into the skin daily.    . Insulin Pen Needle (B-D ULTRAFINE III SHORT PEN) 31G X 8 MM MISC Use 2 pen needles daily with the administration of Insulin. DX 250.00 200 each 3  . lisinopril (PRINIVIL,ZESTRIL) 10 MG tablet Take 1 tablet (10 mg total) by mouth daily. 30 tablet 11  . Multiple Vitamin (MULTIVITAMIN) tablet Take 1 tablet by mouth daily.    . simvastatin (ZOCOR) 20 MG tablet APPOINTMENT OVERDUE, 1 by mouth  daily to lower cholesterol 90 tablet 0  . spironolactone (ALDACTONE) 25 MG tablet Take 25 mg by mouth daily.    . TRUE METRIX BLOOD GLUCOSE TEST test strip USE TO CHECK BLOOD SUGARS ONCE DAILY  11   No current facility-administered medications for this visit.     No Known Allergies  Social History   Social History  . Marital status: Married    Spouse name: N/A  . Number of children: N/A  . Years of education: N/A   Occupational History  . Not on file.   Social History Main Topics  . Smoking status: Never Smoker  . Smokeless tobacco: Current User    Types: Chew  . Alcohol  use No  . Drug use: No  . Sexual activity: Not on file     Comment: unknown   Other Topics Concern  . Not on file   Social History Narrative  . No narrative on file     Review of Systems: General: negative for chills, fever, night sweats or weight changes.  Cardiovascular: negative for chest pain, dyspnea on exertion, edema, orthopnea, palpitations, paroxysmal nocturnal dyspnea or shortness of breath Dermatological: negative for rash Respiratory: negative for cough or wheezing Urologic: negative for hematuria Abdominal: negative for nausea, vomiting, diarrhea, bright red blood per rectum, melena, or hematemesis Neurologic: negative for visual changes, syncope, or dizziness All other systems reviewed and are otherwise negative except as noted above.    Blood pressure (!) 116/54, pulse (!) 51, height 5' 11"  (1.803 m), weight 196 lb (88.9 kg).  General appearance: alert and no distress Neck: no adenopathy, no carotid bruit, no JVD, supple, symmetrical, trachea midline and thyroid not enlarged, symmetric, no tenderness/mass/nodules Lungs: clear to auscultation bilaterally Heart: regular rate and rhythm, S1, S2 normal, no murmur, click, rub or gallop Extremities: Trace right ankle edema, 1-2+ left pretibial edema.  EKG junctional bradycardia 51 with occasional PVC. I have personally reviewed this EKG  ASSESSMENT AND PLAN:   Bradycardia Chronic bradycardia related to A. fib with a slow ventricular response as well as junctional bradycardia. He is not on any rate slowing agents. His major complaint is of fatigue. We talked about the possibility of a permanent transvenous pacemaker which she has declined.  Lower leg edema Chronic bilateral lower extremity edema left greater than right. Exam he has minimal right ankle edema edema 1-2+ left pretibial edema. He does watch his salt intake and is on Aldactone as well as furosemide.  Hyperlipidemia LDL goal <100 History of hyperlipidemia  on statin therapy followed by his PCP  Essential hypertension History of hypertension blood pressure measured at 116/54. He is on clonidine and lisinopril. Continue current meds at current dosing  PAF (paroxysmal atrial fibrillation) (HCC) History of atrial fibrillation now chronic on Eliquis oral anticoagulation.  Hx of CABG History of coronary artery disease status post coronary artery bypass grafting back in 1995. He has had normal LV function until recently when an EF demonstrated by 2-D was 40-45% representing a small decline compared to prior EF of 35-60%. A Myoview stress test performed 02/07/15 showed a small area of inferolateral scar with mild peri-infarct ischemia and an EF of 47%. He denies chest pain or shortness of breath. We elected not to proceed with cardiac catheterization at that time.  Acute on chronic systolic and diastolic heart failure, NYHA class 3 (HCC) Combined chronic systolic and diastolic heart failure now compensated on diuretic therapy.      [name] MD FACP,FACC,FAHA,  FSCAI 10/29/2015 11:17 AM

## 2015-12-24 DIAGNOSIS — Z794 Long term (current) use of insulin: Secondary | ICD-10-CM | POA: Diagnosis not present

## 2015-12-24 DIAGNOSIS — B353 Tinea pedis: Secondary | ICD-10-CM | POA: Diagnosis not present

## 2015-12-24 DIAGNOSIS — E1165 Type 2 diabetes mellitus with hyperglycemia: Secondary | ICD-10-CM | POA: Diagnosis not present

## 2015-12-24 DIAGNOSIS — N183 Chronic kidney disease, stage 3 (moderate): Secondary | ICD-10-CM | POA: Diagnosis not present

## 2016-01-01 DIAGNOSIS — L97512 Non-pressure chronic ulcer of other part of right foot with fat layer exposed: Secondary | ICD-10-CM | POA: Diagnosis not present

## 2016-01-07 ENCOUNTER — Other Ambulatory Visit: Payer: Self-pay

## 2016-01-07 MED ORDER — LISINOPRIL 10 MG PO TABS
10.0000 mg | ORAL_TABLET | Freq: Every day | ORAL | 11 refills | Status: DC
Start: 2016-01-07 — End: 2016-01-14

## 2016-01-14 ENCOUNTER — Other Ambulatory Visit: Payer: Self-pay

## 2016-01-14 ENCOUNTER — Telehealth: Payer: Self-pay | Admitting: Cardiovascular Disease

## 2016-01-14 MED ORDER — LISINOPRIL 10 MG PO TABS: 10.0000 mg | ORAL_TABLET | Freq: Every day | ORAL | 3 refills | Status: DC

## 2016-01-14 MED ORDER — LISINOPRIL 10 MG PO TABS: 10.0000 mg | ORAL_TABLET | Freq: Every day | ORAL | 2 refills | Status: DC

## 2016-01-14 NOTE — Telephone Encounter (Addendum)
Daughter returned call. She is very upset that her father's Rx was NOT sent in for a 90 day supply. Daughter states that [facility] called our office to request a refill for her dad's lisinopril, not [facility] where a 30 day supply with 11 refills was initially sent in - apologized that we have no record of how this Rx came thru - either phone, fax or e-script for tracing. Offered to take [facility] off his file so this pharmacy would not have prescriptions sent there but she does not want this. She states he uses [facility] site.  Daughter states she wants a phone call EVERY time a refill request comes thru for her father. She wants a note on file stating that ALL mail order pharmacy refills should be 90 day supply. She repeatedly states "who is going to pay the difference in the cost for a 30 day and 90 day supply." She wants to know WHO called in a 30 day supply for lisinopril to [facility] and asks if this person is going to "eat the cost" - she states this happened before and put her father in a "donut hole". She states his insulin was previously messed up. She wants to know that this mistake is NOT going to tolerate mistakes like this again.   Apologized repeatedly that this was an oversight that a 30 day supply was sent to a mail order pharmacy. She states sorry is not enough. She states her whole family & a visitor sat around for 3 hours discussing this issue. Daughter is VERY upset. She states this is the second time this has happened.  She states her dad, the patient, needs a written letter stating a 90 day supply with 3 refills has been sent to [facility] sent to her for proof.    Initially she wanted  to speak with Dr. Gwenlyn Found about this (would like a call) or practice administrator however after 30 minutes on the phone, she states her issues were resolved.

## 2016-01-14 NOTE — Addendum Note (Signed)
Addended by: Fidel Levy on: 01/14/2016 12:14 PM   Modules accepted: Orders

## 2016-01-14 NOTE — Telephone Encounter (Signed)
Ms. John Romero is calling because she states there was an error with her fathers refill (John Romero). [name] usually gets a 90 day supply for his Lisinopril medication, he only received a 30 day supply this time. Ms. John Romero requests that someone give her a call back, she requested that "no action is taken until we speak to her." Please call John Romero at [phone].Thanks.

## 2016-01-14 NOTE — Telephone Encounter (Signed)
Letter mailed with documentation of Rx sent in for 90 day supply

## 2016-01-14 NOTE — Telephone Encounter (Signed)
LM that Rx will be sent in for a 90 day supply

## 2016-02-25 ENCOUNTER — Other Ambulatory Visit: Payer: Self-pay | Admitting: Cardiovascular Disease

## 2016-03-06 IMAGING — NM NM MISC PROCEDURE
6 series · 36 of 36 positions shown · non-contrast
Comparison: none

[Series 1: wbr_r-proj_st wbr rest · 6.40mm/px · 6 of 64 frames shown]
[frame 6/64]
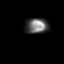
[frame 16/64]
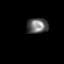
[frame 27/64]
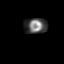
[frame 38/64]
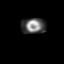
[frame 48/64]
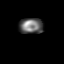
[frame 59/64]
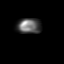

[Series 1: wbr rest · 6.40mm/px · 6 of 64 frames shown]
[frame 6/64]
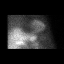
[frame 16/64]
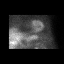
[frame 27/64]
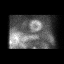
[frame 38/64]
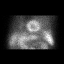
[frame 48/64]
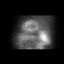
[frame 59/64]
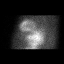

[Series 2: wbr_s-proj_st wbr stress-gsp · 6.40mm/px · 6 of 512 frames shown]
[frame 43/512]
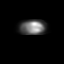
[frame 128/512]
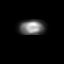
[frame 214/512]
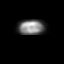
[frame 299/512]
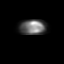
[frame 384/512]
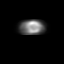
[frame 470/512]
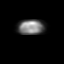

[Series 2: wbr stress-gsp · 6.40mm/px · 6 of 512 frames shown]
[frame 43/512]
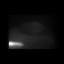
[frame 128/512]
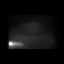
[frame 214/512]
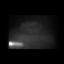
[frame 299/512]
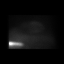
[frame 384/512]
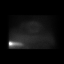
[frame 470/512]
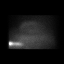

[Series 3: wbr stress-sum-em · 6.40mm/px · 6 of 64 frames shown]
[frame 6/64]
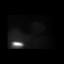
[frame 16/64]
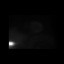
[frame 27/64]
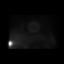
[frame 38/64]
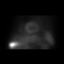
[frame 48/64]
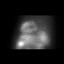
[frame 59/64]
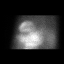

[Series 3: wbr_s-proj_st wbr stress-sum-em · 6.40mm/px · 6 of 64 frames shown]
[frame 6/64]
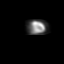
[frame 16/64]
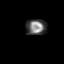
[frame 27/64]
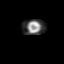
[frame 38/64]
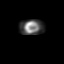
[frame 48/64]
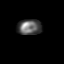
[frame 59/64]
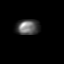

[36 of 36 positions shown; findings below may reference images not displayed]

Canned report from images found in remote index.

Refer to host system for actual result text.

## 2016-03-08 DIAGNOSIS — H52203 Unspecified astigmatism, bilateral: Secondary | ICD-10-CM | POA: Diagnosis not present

## 2016-03-08 DIAGNOSIS — Z961 Presence of intraocular lens: Secondary | ICD-10-CM | POA: Diagnosis not present

## 2016-03-08 DIAGNOSIS — E113293 Type 2 diabetes mellitus with mild nonproliferative diabetic retinopathy without macular edema, bilateral: Secondary | ICD-10-CM | POA: Diagnosis not present

## 2016-04-01 DIAGNOSIS — N183 Chronic kidney disease, stage 3 (moderate): Secondary | ICD-10-CM | POA: Diagnosis not present

## 2016-04-01 DIAGNOSIS — Z794 Long term (current) use of insulin: Secondary | ICD-10-CM | POA: Diagnosis not present

## 2016-04-01 DIAGNOSIS — E1165 Type 2 diabetes mellitus with hyperglycemia: Secondary | ICD-10-CM | POA: Diagnosis not present

## 2016-04-12 ENCOUNTER — Ambulatory Visit (INDEPENDENT_AMBULATORY_CARE_PROVIDER_SITE_OTHER): Payer: Medicare Other | Admitting: Podiatry

## 2016-04-12 ENCOUNTER — Encounter: Payer: Self-pay | Admitting: Podiatry

## 2016-04-12 VITALS — Resp 16 | Ht 71.0 in | Wt 204.0 lb

## 2016-04-12 DIAGNOSIS — E1140 Type 2 diabetes mellitus with diabetic neuropathy, unspecified: Secondary | ICD-10-CM

## 2016-04-12 DIAGNOSIS — M79605 Pain in left leg: Secondary | ICD-10-CM | POA: Diagnosis not present

## 2016-04-12 DIAGNOSIS — M79604 Pain in right leg: Secondary | ICD-10-CM | POA: Diagnosis not present

## 2016-04-12 DIAGNOSIS — L84 Corns and callosities: Secondary | ICD-10-CM | POA: Diagnosis not present

## 2016-04-12 DIAGNOSIS — M2040 Other hammer toe(s) (acquired), unspecified foot: Secondary | ICD-10-CM | POA: Diagnosis not present

## 2016-04-12 DIAGNOSIS — M79675 Pain in left toe(s): Secondary | ICD-10-CM

## 2016-04-12 DIAGNOSIS — B351 Tinea unguium: Secondary | ICD-10-CM

## 2016-04-12 DIAGNOSIS — M79674 Pain in right toe(s): Secondary | ICD-10-CM

## 2016-04-12 DIAGNOSIS — E1149 Type 2 diabetes mellitus with other diabetic neurological complication: Secondary | ICD-10-CM | POA: Diagnosis not present

## 2016-04-12 NOTE — Progress Notes (Signed)
   Subjective:    Patient ID: John Romero, male    DOB: 29-Jul-1930, 83 y.o.   MRN: [id number]  HPI Chief Complaint  Patient presents with  . Debridement    Bilateral nail trim; pt Diabetic Type 2; Sugar=124 this am; A1C=8.1      Review of Systems  HENT: Positive for hearing loss and tinnitus.   Eyes: Positive for itching.  Cardiovascular: Positive for leg swelling.  Genitourinary: Positive for frequency.  Musculoskeletal: Positive for gait problem.  Neurological: Positive for dizziness and weakness.  Hematological: Bruises/bleeds easily.  All other systems reviewed and are negative.      Objective:   Physical Exam        Assessment & Plan:

## 2016-04-13 NOTE — Progress Notes (Signed)
Subjective:     Patient ID: [name], male   DOB: [birth year], 83 y.o.   MRN: [id number]  HPI patient presents stating he has a lot of problems with his nails and they're sore with long-term diabetes and digital deformity third right   Review of Systems  All other systems reviewed and are negative.      Objective:   Physical Exam  Constitutional: He is oriented to person, place, and time.  Cardiovascular: Intact distal pulses.   Musculoskeletal: Normal range of motion.  Neurological: He is oriented to person, place, and time.  Skin: Skin is warm and dry.  Nursing note and vitals reviewed.  vascular status moderately diminished with diminished PT and DP pulses with neurological but is also diminished both sharp dull and vibratory and patient's noted to have severe nails 1-5 bilateral that are thick yellow brittle and painful and lesion third digit right with distal keratotic tissue formation and pain with digital deformity bilateral noted     Assessment:     Structural deformity with at risk diabetes mycotic nail infection with pain and lesion formation distal third right    Plan:     H&P diabetic education rendered to patient and debrided nailbeds 1-5 both feet and lesion right with no ulcer but a pre-ulcerative type condition. Dispense buttress pad to lift the toes and padding and instructed long-term on diabetic shoes that we will get approval for this patient do to the at risk factor and he is

## 2016-04-22 DIAGNOSIS — I4891 Unspecified atrial fibrillation: Secondary | ICD-10-CM | POA: Diagnosis not present

## 2016-04-22 DIAGNOSIS — N183 Chronic kidney disease, stage 3 (moderate): Secondary | ICD-10-CM | POA: Diagnosis not present

## 2016-04-22 DIAGNOSIS — Z79899 Other long term (current) drug therapy: Secondary | ICD-10-CM | POA: Diagnosis not present

## 2016-04-22 DIAGNOSIS — Z1389 Encounter for screening for other disorder: Secondary | ICD-10-CM | POA: Diagnosis not present

## 2016-04-22 DIAGNOSIS — I129 Hypertensive chronic kidney disease with stage 1 through stage 4 chronic kidney disease, or unspecified chronic kidney disease: Secondary | ICD-10-CM | POA: Diagnosis not present

## 2016-04-30 ENCOUNTER — Ambulatory Visit (INDEPENDENT_AMBULATORY_CARE_PROVIDER_SITE_OTHER): Payer: Medicare Other | Admitting: Cardiology

## 2016-04-30 ENCOUNTER — Encounter: Payer: Self-pay | Admitting: Cardiology

## 2016-04-30 ENCOUNTER — Telehealth: Payer: Self-pay | Admitting: Podiatry

## 2016-04-30 VITALS — BP 162/80 | HR 47 | Ht 71.0 in | Wt 205.2 lb

## 2016-04-30 DIAGNOSIS — Z7901 Long term (current) use of anticoagulants: Secondary | ICD-10-CM | POA: Diagnosis not present

## 2016-04-30 DIAGNOSIS — I482 Chronic atrial fibrillation, unspecified: Secondary | ICD-10-CM

## 2016-04-30 DIAGNOSIS — I10 Essential (primary) hypertension: Secondary | ICD-10-CM

## 2016-04-30 DIAGNOSIS — Z794 Long term (current) use of insulin: Secondary | ICD-10-CM

## 2016-04-30 DIAGNOSIS — I480 Paroxysmal atrial fibrillation: Secondary | ICD-10-CM

## 2016-04-30 DIAGNOSIS — E1121 Type 2 diabetes mellitus with diabetic nephropathy: Secondary | ICD-10-CM | POA: Diagnosis not present

## 2016-04-30 DIAGNOSIS — N183 Chronic kidney disease, stage 3 unspecified: Secondary | ICD-10-CM

## 2016-04-30 DIAGNOSIS — Z951 Presence of aortocoronary bypass graft: Secondary | ICD-10-CM

## 2016-04-30 DIAGNOSIS — R001 Bradycardia, unspecified: Secondary | ICD-10-CM

## 2016-04-30 NOTE — Assessment & Plan Note (Addendum)
Labs followed by PCP. The pt tells me this is getting worse

## 2016-04-30 NOTE — Assessment & Plan Note (Signed)
This appears to be permanent. His rate is slow and he admits he is fatigued with any exertion-I suggested he cut his Atenolol back to 25 mg daily

## 2016-04-30 NOTE — Assessment & Plan Note (Signed)
Eliquis-CHADs VASc= 8 for age, CHF, HTN, DM, H/O CVA, and vascular disease 

## 2016-04-30 NOTE — Patient Instructions (Signed)
Medication Instructions:  DECREASE atenolol to 25mg  (1/2 tablet) daily   Follow-Up: Your physician wants you to follow-up in: 6 MONTHS with Dr. Gwenlyn Found. You will receive a reminder letter in the mail two months in advance. If you don't receive a letter, please call our office to schedule the follow-up appointment.   Any Other Special Instructions Will Be Listed Below (If Applicable).     If you need a refill on your cardiac medications before your next appointment, please call your pharmacy.

## 2016-04-30 NOTE — Assessment & Plan Note (Signed)
CABG '95, Myoview low risk Jan 2017

## 2016-04-30 NOTE — Assessment & Plan Note (Addendum)
B/P a little high- I did not adjust his medications for this

## 2016-04-30 NOTE — Progress Notes (Signed)
04/30/2016 John Romero   1930/04/13  [id number]  Primary Physician Mathews Argyle, MD Primary Cardiologist: Dr Gwenlyn Found  HPI:  83 year old Caucasian male father of 2, retired from working at [employer]. He is followed by Dr Gwenlyn Found.  He has a history of hypertension, hyperlipidemia, type 2 IDDM diabetes, chronic kidney disease stage III, chronic A. Fib, and CAD-s/p CABG back in 1995. A Myoview stress test performed on 02/07/15 showed a small area of inferolateral scar with mild peri-infarct ischemia and an ejection fraction of 47%.   He last saw Dr Gwenlyn Found Oct 2017.  He is here today for a 6 month check. Dr Felipa Eth follows him c;closely and recently changed him from Lopressor to Atenolol, I suspect secondary to worsening renal function though I don't have any recent labs. The pt also tells me Dr Felipa Eth has been adjusting his Insulin. From a cardiac standpoint he no new complaints. He has had chronic LE edema which I believe is venous insufficiency. He is unable tolerate compression stockings. He denies any orthopnea but admits he gets fatigued with any activity.    Current Outpatient Prescriptions  Medication Sig Dispense Refill  . allopurinol (ZYLOPRIM) 300 MG tablet TAKE 1/2 TABLET DAILY TO PREVENT GOUT ATTACK 90 tablet 1  . aspirin EC 81 MG tablet Take 1 tablet (81 mg total) by mouth daily. 90 tablet 3  . atenolol (TENORMIN) 50 MG tablet Take 25 mg by mouth daily.    . cloNIDine (CATAPRES) 0.3 MG tablet Take one tablet by mouth twice daily for blood pressure 60 tablet 0  . ELIQUIS 5 MG TABS tablet TAKE 1 TABLET TWICE A DAY 180 tablet 1  . furosemide (LASIX) 40 MG tablet Take 1 tablet by mouth every morning.    . insulin aspart (NOVOLOG) 100 UNIT/ML injection Inject 0-15 Units into the skin 3 (three) times daily with meals. Sliding scale  CBG 70 - 120: 0 units: CBG 121 - 150: 2 units; CBG 151 - 200: 3 units; CBG 201 - 250: 5 units; CBG 251 - 300: 8 units;CBG 301 - 350: 11 units; CBG  351 - 400: 15 units; CBG > 400 : 15 units and notify MD (Patient taking differently: Inject 0-15 Units into the skin 3 (three) times daily with meals. Sliding scale  CBG 70 - 120: 0 units: CBG 121 - 150: 2 units; CBG 151 - 200: 3 units; CBG 201 - 250: 5 units; CBG 251 - 300: 8 units;CBG 301 - 350: 11 units; CBG 351 - 400: 15 units; CBG > 400 : 15 units and notify MD  Pt stated on 04/12/16; "Takes 10 units at breakfast; 6 units at lunch; 10 units at dinner") 10 mL 11  . Insulin Detemir (LEVEMIR) 100 UNIT/ML Pen Inject 38 Units into the skin daily. Pt takes 24 units at night time    . lisinopril (PRINIVIL,ZESTRIL) 10 MG tablet Take 1 tablet (10 mg total) by mouth daily. 90 tablet 3  . Multiple Vitamin (MULTIVITAMIN) tablet Take 1 tablet by mouth daily.    . simvastatin (ZOCOR) 20 MG tablet Take 20 mg by mouth daily.    Marland Kitchen spironolactone (ALDACTONE) 25 MG tablet Take 25 mg by mouth daily.     No current facility-administered medications for this visit.     No Known Allergies  Past Medical History:  Diagnosis Date  . CAD (coronary artery disease)    a. s/p CABG, details unclear.  . CKD (chronic kidney disease), stage III   .  Edema   . Essential hypertension   . First degree atrioventricular block   . Gout, unspecified   . Gout, unspecified   . Hyperlipidemia   . Hypertensive encephalopathy 10/2014  . Hypertensive heart disease    a. 2D echo 11/01/14: hypokinesis of basal inferior and inferolateral walls, severe focal basal hypertrophy of the septum, EF 55-60%, mild AI, mod LAE.   . Malignant neoplasm of prostate ([place])   . Old myocardial infarction   . PAF (paroxysmal atrial fibrillation) ([place])    a. Dx 10/2014.   . Stroke (cerebrum) ([place])    a. CT head 10/2014 showed occult strokes: "Old RIGHT basal ganglia lacunar infarct, small bilateral cerebellar infarcts."  . Type II or unspecified type diabetes mellitus without mention of complication, uncontrolled   . Unspecified tinnitus      Social History   Social History  . Marital status: Married    Spouse name: N/A  . Number of children: N/A  . Years of education: N/A   Occupational History  . Not on file.   Social History Main Topics  . Smoking status: Never Smoker  . Smokeless tobacco: Current User    Types: Chew  . Alcohol use No  . Drug use: No  . Sexual activity: Not on file     Comment: unknown   Other Topics Concern  . Not on file   Social History Narrative  . No narrative on file     Family History  Problem Relation Age of Onset  . Diabetes Mother   . Hypertension Mother   . Heart disease Father   . Cancer Sister     lung  . Cancer Sister     lung  . Heart attack Neg Hx   . Stroke Neg Hx      Review of Systems: General: negative for chills, fever, night sweats or weight changes.  Cardiovascular: negative for chest pain, orthopnea, palpitations, paroxysmal nocturnal dyspnea or shortness of breath Dermatological: negative for rash Respiratory: negative for cough or wheezing Urologic: negative for hematuria Abdominal: negative for nausea, vomiting, diarrhea, bright red blood per rectum, melena, or hematemesis Neurologic: negative for visual changes, syncope, or dizziness All other systems reviewed and are otherwise negative except as noted above.    Blood pressure (!) 162/80, pulse (!) 47, height 5\' 11"  (1.803 m), weight 205 lb 3.2 oz (93.1 kg).  General appearance: alert, cooperative and no distress Neck: no carotid bruit and no JVD Lungs: clear to auscultation bilaterally Heart: irregularly irregular rhythm Extremities: 1+ edema Neurologic: Grossly normal though he has a Parkinsonian type rigidity to him   EKG It appears to be junctional rhythm or slow AF- rate is 47.   ASSESSMENT AND PLAN:   Chronic anticoagulation Eliquis-CHADs VASc= 8 for age, CHF, HTN, DM, H/O CVA, and vascular disease  Chronic kidney disease, stage 3 Labs followed by PCP. The pt tells me this is  getting worse  DM (diabetes mellitus), type 2 with renal complications (HCC) Insulin being adjusted by PCP  Essential hypertension B/P a little high- I did not adjust his medications for this  Atrial fibrillation ([place]) This appears to be permanent. His rate is slow and he admits he is fatigued with any exertion-I suggested he cut his Atenolol back to 25 mg daily  Hx of CABG CABG '95, Myoview low risk Jan 2017   PLAN  Decrease Atenolol to 25 mg daily. F/U with Dr Gwenlyn Found in 6 months.   [name] PA-C 04/30/2016  4:50 PM

## 2016-04-30 NOTE — Telephone Encounter (Signed)
Left message for patients daughter, [name], to schedule an appointment with Liliane Channel to be measured for diabetic shoes.

## 2016-04-30 NOTE — Assessment & Plan Note (Signed)
Insulin being adjusted by PCP

## 2016-05-11 ENCOUNTER — Ambulatory Visit: Payer: Medicare Other

## 2016-05-28 DIAGNOSIS — M25552 Pain in left hip: Secondary | ICD-10-CM | POA: Diagnosis not present

## 2016-06-10 ENCOUNTER — Ambulatory Visit (INDEPENDENT_AMBULATORY_CARE_PROVIDER_SITE_OTHER): Payer: Medicare Other | Admitting: Podiatry

## 2016-06-10 DIAGNOSIS — E1149 Type 2 diabetes mellitus with other diabetic neurological complication: Secondary | ICD-10-CM

## 2016-06-10 DIAGNOSIS — L84 Corns and callosities: Secondary | ICD-10-CM | POA: Diagnosis not present

## 2016-06-10 DIAGNOSIS — E1140 Type 2 diabetes mellitus with diabetic neuropathy, unspecified: Secondary | ICD-10-CM

## 2016-06-10 DIAGNOSIS — M2040 Other hammer toe(s) (acquired), unspecified foot: Secondary | ICD-10-CM | POA: Diagnosis not present

## 2016-06-11 NOTE — Progress Notes (Signed)
Patient came in today to pick up diabetic shoes and custom inserts.  Same was well pleased with fit and function.   The foot ortheses offered full contact with plantar surface and contoured the arch well.   The shoes fit well with no heel slippage and areas of pressure concern.   Patient advised to contact us if any problems arise.  A5500 x 2...[id number] x 6  Dr. Paulla Dolly to close

## 2016-06-15 ENCOUNTER — Other Ambulatory Visit: Payer: Medicare Other

## 2016-07-08 DIAGNOSIS — N183 Chronic kidney disease, stage 3 (moderate): Secondary | ICD-10-CM | POA: Diagnosis not present

## 2016-07-08 DIAGNOSIS — Z794 Long term (current) use of insulin: Secondary | ICD-10-CM | POA: Diagnosis not present

## 2016-07-08 DIAGNOSIS — E1165 Type 2 diabetes mellitus with hyperglycemia: Secondary | ICD-10-CM | POA: Diagnosis not present

## 2016-07-12 ENCOUNTER — Ambulatory Visit (INDEPENDENT_AMBULATORY_CARE_PROVIDER_SITE_OTHER): Payer: Medicare Other | Admitting: Podiatry

## 2016-07-12 DIAGNOSIS — E1149 Type 2 diabetes mellitus with other diabetic neurological complication: Secondary | ICD-10-CM

## 2016-07-12 DIAGNOSIS — E1140 Type 2 diabetes mellitus with diabetic neuropathy, unspecified: Secondary | ICD-10-CM

## 2016-07-12 DIAGNOSIS — L84 Corns and callosities: Secondary | ICD-10-CM

## 2016-07-12 DIAGNOSIS — Q828 Other specified congenital malformations of skin: Secondary | ICD-10-CM | POA: Diagnosis not present

## 2016-07-12 DIAGNOSIS — B351 Tinea unguium: Secondary | ICD-10-CM

## 2016-07-12 DIAGNOSIS — M79604 Pain in right leg: Secondary | ICD-10-CM | POA: Diagnosis not present

## 2016-07-12 DIAGNOSIS — M79605 Pain in left leg: Secondary | ICD-10-CM

## 2016-07-12 NOTE — Progress Notes (Signed)
Subjective:    Patient ID: John Romero, male   DOB: 83 y.o.   MRN: [id number]   HPI patient presents with significant nail disease 1-5 both feet that are painful and lesion on the third digit left that's very painful when pressed with long-term diabetes with diminished neurological sensation    ROS      Objective:  Physical Exam neurovascular status unchanged from previously with diminished neuro sensation left and right with nail disease 1-5 both feet that are thick yellow brittle and painful     Assessment:    Mycotic nail infection with pain 1-5 both feet with lesion formation left     Plan:    H&P debridement of nails accomplished 1-5 both feet and debridement of lesion third left with sterile sharp instrumentation with no iatrogenic bleeding noted. Reappoint to recheck

## 2016-08-18 ENCOUNTER — Other Ambulatory Visit: Payer: Self-pay | Admitting: Cardiovascular Disease

## 2016-08-27 DIAGNOSIS — N183 Chronic kidney disease, stage 3 (moderate): Secondary | ICD-10-CM | POA: Diagnosis not present

## 2016-08-27 DIAGNOSIS — I4891 Unspecified atrial fibrillation: Secondary | ICD-10-CM | POA: Diagnosis not present

## 2016-08-27 DIAGNOSIS — E1140 Type 2 diabetes mellitus with diabetic neuropathy, unspecified: Secondary | ICD-10-CM | POA: Diagnosis not present

## 2016-08-27 DIAGNOSIS — I129 Hypertensive chronic kidney disease with stage 1 through stage 4 chronic kidney disease, or unspecified chronic kidney disease: Secondary | ICD-10-CM | POA: Diagnosis not present

## 2016-08-27 DIAGNOSIS — I5023 Acute on chronic systolic (congestive) heart failure: Secondary | ICD-10-CM | POA: Diagnosis not present

## 2016-08-27 DIAGNOSIS — E1121 Type 2 diabetes mellitus with diabetic nephropathy: Secondary | ICD-10-CM | POA: Diagnosis not present

## 2016-09-16 DIAGNOSIS — I129 Hypertensive chronic kidney disease with stage 1 through stage 4 chronic kidney disease, or unspecified chronic kidney disease: Secondary | ICD-10-CM | POA: Diagnosis not present

## 2016-09-16 DIAGNOSIS — I480 Paroxysmal atrial fibrillation: Secondary | ICD-10-CM | POA: Diagnosis not present

## 2016-09-16 DIAGNOSIS — N183 Chronic kidney disease, stage 3 (moderate): Secondary | ICD-10-CM | POA: Diagnosis not present

## 2016-09-16 DIAGNOSIS — I5020 Unspecified systolic (congestive) heart failure: Secondary | ICD-10-CM | POA: Diagnosis not present

## 2016-09-28 ENCOUNTER — Telehealth: Payer: Self-pay | Admitting: Cardiovascular Disease

## 2016-09-28 ENCOUNTER — Emergency Department (HOSPITAL_COMMUNITY)
Admission: EM | Admit: 2016-09-28 | Discharge: 2016-09-28 | Disposition: A | Discharge status: Home / Self Care | Payer: Medicare Other | Attending: Emergency Medicine | Admitting: Emergency Medicine

## 2016-09-28 ENCOUNTER — Emergency Department (HOSPITAL_COMMUNITY): Payer: Medicare Other

## 2016-09-28 ENCOUNTER — Encounter (HOSPITAL_COMMUNITY): Payer: Self-pay | Admitting: *Deleted

## 2016-09-28 DIAGNOSIS — I2510 Atherosclerotic heart disease of native coronary artery without angina pectoris: Secondary | ICD-10-CM | POA: Diagnosis not present

## 2016-09-28 DIAGNOSIS — I252 Old myocardial infarction: Secondary | ICD-10-CM | POA: Diagnosis not present

## 2016-09-28 DIAGNOSIS — Z794 Long term (current) use of insulin: Secondary | ICD-10-CM | POA: Diagnosis not present

## 2016-09-28 DIAGNOSIS — R410 Disorientation, unspecified: Secondary | ICD-10-CM | POA: Diagnosis present

## 2016-09-28 DIAGNOSIS — R404 Transient alteration of awareness: Secondary | ICD-10-CM

## 2016-09-28 DIAGNOSIS — E1122 Type 2 diabetes mellitus with diabetic chronic kidney disease: Secondary | ICD-10-CM | POA: Diagnosis not present

## 2016-09-28 DIAGNOSIS — Z7982 Long term (current) use of aspirin: Secondary | ICD-10-CM | POA: Diagnosis not present

## 2016-09-28 DIAGNOSIS — I10 Essential (primary) hypertension: Secondary | ICD-10-CM

## 2016-09-28 DIAGNOSIS — Z8546 Personal history of malignant neoplasm of prostate: Secondary | ICD-10-CM | POA: Insufficient documentation

## 2016-09-28 DIAGNOSIS — I5042 Chronic combined systolic (congestive) and diastolic (congestive) heart failure: Secondary | ICD-10-CM | POA: Insufficient documentation

## 2016-09-28 DIAGNOSIS — Z8673 Personal history of transient ischemic attack (TIA), and cerebral infarction without residual deficits: Secondary | ICD-10-CM | POA: Insufficient documentation

## 2016-09-28 DIAGNOSIS — N183 Chronic kidney disease, stage 3 (moderate): Secondary | ICD-10-CM | POA: Diagnosis not present

## 2016-09-28 DIAGNOSIS — Z79899 Other long term (current) drug therapy: Secondary | ICD-10-CM | POA: Insufficient documentation

## 2016-09-28 DIAGNOSIS — I130 Hypertensive heart and chronic kidney disease with heart failure and stage 1 through stage 4 chronic kidney disease, or unspecified chronic kidney disease: Secondary | ICD-10-CM | POA: Insufficient documentation

## 2016-09-28 DIAGNOSIS — Z7901 Long term (current) use of anticoagulants: Secondary | ICD-10-CM | POA: Insufficient documentation

## 2016-09-28 LAB — COMPREHENSIVE METABOLIC PANEL
ALK PHOS: 70 U/L (ref 38–126)
ALT: 24 U/L (ref 17–63)
ANION GAP: 12 (ref 5–15)
AST: 28 U/L (ref 15–41)
Albumin: 4 g/dL (ref 3.5–5.0)
BILIRUBIN TOTAL: 0.7 mg/dL (ref 0.3–1.2)
BUN: 22 mg/dL — ABNORMAL HIGH (ref 6–20)
CALCIUM: 9.2 mg/dL (ref 8.9–10.3)
CO2: 24 mmol/L (ref 22–32)
Chloride: 101 mmol/L (ref 101–111)
Creatinine, Ser: 1.4 mg/dL — ABNORMAL HIGH (ref 0.61–1.24)
GFR calc non Af Amer: 45 mL/min — ABNORMAL LOW (ref >60)
GFR, EST AFRICAN AMERICAN: 52 mL/min — AB (ref >60)
Glucose, Bld: 234 mg/dL — ABNORMAL HIGH (ref 65–99)
Potassium: 3.8 mmol/L (ref 3.5–5.1)
SODIUM: 137 mmol/L (ref 135–145)
TOTAL PROTEIN: 6.7 g/dL (ref 6.5–8.1)

## 2016-09-28 LAB — CBC
HEMATOCRIT: 43.9 % (ref 39.0–52.0)
HEMOGLOBIN: 15 g/dL (ref 13.0–17.0)
MCH: 31.5 pg (ref 26.0–34.0)
MCHC: 34.2 g/dL (ref 30.0–36.0)
MCV: 92.2 fL (ref 78.0–100.0)
PLATELETS: 191 10*3/uL (ref 150–400)
RBC: 4.76 MIL/uL (ref 4.22–5.81)
RDW: 13.8 % (ref 11.5–15.5)
WBC: 9.4 10*3/uL (ref 4.0–10.5)

## 2016-09-28 LAB — DIFFERENTIAL
Basophils Absolute: 0.1 10*3/uL (ref 0.0–0.1)
Basophils Relative: 1 %
EOS PCT: 1 %
Eosinophils Absolute: 0.1 10*3/uL (ref 0.0–0.7)
LYMPHS ABS: 1.9 10*3/uL (ref 0.7–4.0)
LYMPHS PCT: 20 %
MONO ABS: 0.4 10*3/uL (ref 0.1–1.0)
Monocytes Relative: 4 %
Neutro Abs: 7 10*3/uL (ref 1.7–7.7)
Neutrophils Relative %: 74 %

## 2016-09-28 LAB — I-STAT CHEM 8, ED
BUN: 29 mg/dL — ABNORMAL HIGH (ref 6–20)
CALCIUM ION: 1.14 mmol/L — AB (ref 1.15–1.40)
Chloride: 99 mmol/L — ABNORMAL LOW (ref 101–111)
Creatinine, Ser: 1.3 mg/dL — ABNORMAL HIGH (ref 0.61–1.24)
Glucose, Bld: 239 mg/dL — ABNORMAL HIGH (ref 65–99)
HCT: 45 % (ref 39.0–52.0)
Hemoglobin: 15.3 g/dL (ref 13.0–17.0)
Potassium: 3.8 mmol/L (ref 3.5–5.1)
SODIUM: 140 mmol/L (ref 135–145)
TCO2: 30 mmol/L (ref 22–32)

## 2016-09-28 LAB — APTT: APTT: 39 s — AB (ref 24–36)

## 2016-09-28 LAB — I-STAT TROPONIN, ED: TROPONIN I, POC: 0.02 ng/mL (ref 0.00–0.08)

## 2016-09-28 LAB — CBG MONITORING, ED: GLUCOSE-CAPILLARY: 218 mg/dL — AB (ref 65–99)

## 2016-09-28 LAB — PROTIME-INR
INR: 1.14
PROTHROMBIN TIME: 14.5 s (ref 11.4–15.2)

## 2016-09-28 MED ORDER — CLONIDINE HCL 0.1 MG PO TABS
0.3000 mg | ORAL_TABLET | Freq: Once | ORAL | Status: AC
  Administered 2016-09-28: 0.3 mg via ORAL
  Filled 2016-09-28: qty 1

## 2016-09-28 NOTE — Discharge Instructions (Signed)
Take all of your medicine, as usual; but remember we already gave you your evening dose of clonidine, so you do not need to take anymore this evening.  Continue to watch her salt intake, to help lower your blood pressure.  Return here, if needed, for problems.

## 2016-09-28 NOTE — Telephone Encounter (Signed)
Spoke to daughter, calling reporting "possible TIA this morning". Reports she got call from EMS that patient was at a restaurant and felt cold, clammy, lightheaded and dizzy this AM - symptoms lasted briefly. He did not take transport to hospital. Per her report EMS recommended he follow up w cardiology, and she called because "he's due to be seen soon anyway". I advised that for his acute symptoms he should be seen in ED and explained rationale for a prompt hospital evaluation. She was reluctant to heed this advice and stated "I don't want him sitting in that waiting room with all those people on welfare". I explained further that if he were to come to the office any tests performed/ordered today cannot be reviewed promptly as they would in the ED. I also explained that if he is truly having stroke symptoms he would need stat tests such as imaging, labwork, & could be evaluated w urgency in the ED.  She stated she wished to discuss w the patient "since it's his body". I acknowledged and restated preference for hospital evaluation. She voiced understanding & will call back if needed.

## 2016-09-28 NOTE — Telephone Encounter (Signed)
Mrs. Kennith Gain is returning a call . Thanks

## 2016-09-28 NOTE — ED Triage Notes (Signed)
Pt states that he had an ems came out this am at 0900 because he was not right.  He got dizzy and could not think.  He said he was mobile.  EMS told him he may need to be worked up for TIA.  Pt states the episode lasted about one hour.  Daughter and patient say he is back per his normal.

## 2016-09-28 NOTE — Telephone Encounter (Signed)
Daughter called back. Discussed recommendations from earlier. States they agreed to take pt to ED for evaluation, will go to [facility] for assessment. I have contacted [place] cardiology cardmaster and [facility] Triage nurse Raquel Sarna who is aware of patient arrival via private vehicle.

## 2016-09-28 NOTE — ED Provider Notes (Signed)
[facility] DEPT Provider Note   CSN: [id number] Arrival date & time: 09/28/16  1507     History   Chief Complaint Chief Complaint  Patient presents with  . Stroke Symptoms    HPI John Romero is a 83 y.o. male.  He is here for evaluation of an episode of "disorientation."  This occurred while he was sitting, at a restaurant, shortly after eating.  He reportedly had a 1 hour period, when he felt unaware of what was happening.  This however tended to improve, and he remembers pieces of events, shortly after onset of the symptom.  He recalls talking to the ambulance attendants after they arrived and remembers their recommendation that he get checked out.  After the symptoms resolved in about 1 hour, he decided to go home, and later decided to come here to get checked because EMS thought he might of had a "TIA."  He had another episode of disorientation, which was much worse, in 2016, when he was admitted.  He was diagnosed with hypertensive encephalopathy, and treated symptomatically, hospitalized and subsequently was in rehab for about 2 weeks.  No other episodes since then.  He has hypertension treated with medications, and recently had his Lasix increased because of chest congestion.  He followed up after that, and his congestion improved.  He denies fever, chills, nausea, vomiting, chest pain, paresthesia or focal weakness.  His daughter is hemodifying factors.re and gives additional history, and supports with the patient states.  There are no other known modifying factors.  HPI  Past Medical History:  Diagnosis Date  . CAD (coronary artery disease)    a. s/p CABG, details unclear.  . CKD (chronic kidney disease), stage III   . Edema   . Essential hypertension   . First degree atrioventricular block   . Gout, unspecified   . Gout, unspecified   . Hyperlipidemia   . Hypertensive encephalopathy 10/2014  . Hypertensive heart disease    a. 2D echo 11/01/14: hypokinesis of basal  inferior and inferolateral walls, severe focal basal hypertrophy of the septum, EF 55-60%, mild AI, mod LAE.   . Malignant neoplasm of prostate ([place])   . Old myocardial infarction   . PAF (paroxysmal atrial fibrillation) ([place])    a. Dx 10/2014.   . Stroke (cerebrum) ([place])    a. CT head 10/2014 showed occult strokes: "Old RIGHT basal ganglia lacunar infarct, small bilateral cerebellar infarcts."  . Type II or unspecified type diabetes mellitus without mention of complication, uncontrolled   . Unspecified tinnitus     Patient Active Problem List   Diagnosis Date Noted  . Chronic combined systolic and diastolic CHF (congestive heart failure) ([place]) 04/30/2015  . Hypertensive cardiovascular disease 04/30/2015  . Atrial fibrillation ([place]) 04/30/2015  . Chronic anticoagulation 04/30/2015  . Acute on chronic systolic and diastolic heart failure, NYHA class 3 ([place]) 01/08/2015  . Hx of CABG 11/04/2014  . Elevated troponin 11/01/2014  . PAF (paroxysmal atrial fibrillation) ([place]) 11/01/2014  . Acute encephalopathy 10/31/2014  . Hypertensive urgency 10/31/2014  . DM (diabetes mellitus), type 2 with renal complications ([place]) 36/64/4034  . Secondary hypertension, unspecified   . Hypertensive encephalopathy 10/19/2014  . First degree heart block by electrocardiogram 02/19/2014  . Essential hypertension 02/19/2014  . Encounter for Medicare annual wellness exam 02/19/2014  . Gout, chronic 10/17/2013  . Hypokalemia 10/17/2013  . Hyperlipidemia LDL goal <100 10/17/2013  . Lower leg edema 06/06/2013  . Bradycardia 03/07/2013  . Pressure ulcer of  coccygeal region, stage 2 12/19/2012  . Hypertensive renal disease 11/07/2012  . Rash and nonspecific skin eruption 11/07/2012  . Gout 05/31/2012  . Chronic kidney disease, stage 3 05/31/2012    Past Surgical History:  Procedure Laterality Date  . APPENDECTOMY    . CORONARY ARTERY BYPASS GRAFT    . EYE SURGERY  2014  . PROSTATE SURGERY          Home Medications    Prior to Admission medications   Medication Sig Start Date End Date Taking? Authorizing Provider  allopurinol (ZYLOPRIM) 300 MG tablet TAKE 1/2 TABLET DAILY TO PREVENT GOUT ATTACK 11/05/14   Rai, Vernelle Emerald, MD  aspirin EC 81 MG tablet Take 1 tablet (81 mg total) by mouth daily. 02/11/15   [name], MD  atenolol (TENORMIN) 50 MG tablet Take 25 mg by mouth daily. 01/29/16   [provider]  cloNIDine (CATAPRES) 0.3 MG tablet Take one tablet by mouth twice daily for blood pressure 09/20/14   [name], DO  ELIQUIS 5 MG TABS tablet TAKE 1 TABLET TWICE A DAY 08/19/16   [name], MD  furosemide (LASIX) 40 MG tablet Take 1 tablet by mouth every morning. 04/01/16   [provider]  insulin aspart (NOVOLOG) 100 UNIT/ML injection Inject 0-15 Units into the skin 3 (three) times daily with meals. Sliding scale  CBG 70 - 120: 0 units: CBG 121 - 150: 2 units; CBG 151 - 200: 3 units; CBG 201 - 250: 5 units; CBG 251 - 300: 8 units;CBG 301 - 350: 11 units; CBG 351 - 400: 15 units; CBG > 400 : 15 units and notify MD Patient taking differently: Inject 0-15 Units into the skin 3 (three) times daily with meals. Sliding scale  CBG 70 - 120: 0 units: CBG 121 - 150: 2 units; CBG 151 - 200: 3 units; CBG 201 - 250: 5 units; CBG 251 - 300: 8 units;CBG 301 - 350: 11 units; CBG 351 - 400: 15 units; CBG > 400 : 15 units and notify MD  Pt stated on 04/12/16; "Takes 10 units at breakfast; 6 units at lunch; 10 units at dinner" 11/05/14   Rai, Ripudeep K, MD  Insulin Detemir (LEVEMIR) 100 UNIT/ML Pen Inject 38 Units into the skin daily. Pt takes 24 units at night time    [provider]  lisinopril (PRINIVIL,ZESTRIL) 10 MG tablet Take 1 tablet (10 mg total) by mouth daily. 01/14/16   [name], MD  Multiple Vitamin (MULTIVITAMIN) tablet Take 1 tablet by mouth daily.    [provider]  simvastatin (ZOCOR) 20 MG tablet Take 20 mg by mouth  daily.    [provider]  spironolactone (ALDACTONE) 25 MG tablet Take 25 mg by mouth daily. 03/24/15   [provider]    Family History Family History  Problem Relation Age of Onset  . Diabetes Mother   . Hypertension Mother   . Heart disease Father   . Cancer Sister        lung  . Cancer Sister        lung  . Heart attack Neg Hx   . Stroke Neg Hx     Social History Social History  Substance Use Topics  . Smoking status: Never Smoker  . Smokeless tobacco: Current User    Types: Chew  . Alcohol use No     Allergies   Patient has no known allergies.   Review of Systems Review of  Systems  All other systems reviewed and are negative.    Physical Exam Updated Vital Signs BP (!) 205/117   Pulse 65   Temp 97.8 F (36.6 C) (Oral)   Resp (!) 21   Ht 5\' 11"  (1.803 m)   Wt 90.7 kg (200 lb)   SpO2 96%   BMI 27.89 kg/m   Physical Exam  Constitutional: He is oriented to person, place, and time. He appears well-developed and well-nourished.  HENT:  Head: Normocephalic and atraumatic.  Right Ear: External ear normal.  Left Ear: External ear normal.  Eyes: Pupils are equal, round, and reactive to light. Conjunctivae and EOM are normal.  Neck: Normal range of motion and phonation normal. Neck supple.  Cardiovascular: Normal rate, regular rhythm and normal heart sounds.   Pulmonary/Chest: Effort normal and breath sounds normal. He exhibits no bony tenderness.  Abdominal: Soft. There is no tenderness.  Musculoskeletal: Normal range of motion.  No dysarthria, aphasia or nystagmus.  Neurological: He is alert and oriented to person, place, and time. No cranial nerve deficit or sensory deficit. He exhibits normal muscle tone. Coordination normal.  Skin: Skin is warm, dry and intact.  Psychiatric: He has a normal mood and affect. His behavior is normal. Judgment and thought content normal.  Nursing note and vitals reviewed.    ED Treatments / Results   Labs (all labs ordered are listed, but only abnormal results are displayed) Labs Reviewed  APTT - Abnormal; Notable for the following:       Result Value   aPTT 39 (*)    All other components within normal limits  COMPREHENSIVE METABOLIC PANEL - Abnormal; Notable for the following:    Glucose, Bld 234 (*)    BUN 22 (*)    Creatinine, Ser 1.40 (*)    GFR calc non Af Amer 45 (*)    GFR calc Af Amer 52 (*)    All other components within normal limits  CBG MONITORING, ED - Abnormal; Notable for the following:    Glucose-Capillary 218 (*)    All other components within normal limits  I-STAT CHEM 8, ED - Abnormal; Notable for the following:    Chloride 99 (*)    BUN 29 (*)    Creatinine, Ser 1.30 (*)    Glucose, Bld 239 (*)    Calcium, Ion 1.14 (*)    All other components within normal limits  PROTIME-INR  CBC  DIFFERENTIAL  I-STAT TROPONIN, ED  CBG MONITORING, ED    EKG  EKG Interpretation  Date/Time:  Tuesday September 28 2016 15:19:37 EDT Ventricular Rate:  64 PR Interval:    QRS Duration: 92 QT Interval:  456 QTC Calculation: 470 R Axis:   9 Text Interpretation:  Atrial fibrillation Nonspecific ST abnormality Abnormal ECG Since last tracing rate slower Confirmed by Daleen Bo [phone]) on 09/28/2016 6:46:48 PM       Radiology Ct Head Wo Contrast  Result Date: 09/28/2016 CLINICAL DATA:  Confusion and weakness EXAM: CT HEAD WITHOUT CONTRAST TECHNIQUE: Contiguous axial images were obtained from the base of the skull through the vertex without intravenous contrast. COMPARISON:  10/30/2014 FINDINGS: Brain: Diffuse atrophic changes and mild chronic white matter ischemic changes are seen. No findings to suggest acute hemorrhage, acute infarction or space-occupying mass lesion are noted. Vascular: No hyperdense vessel or unexpected calcification. Skull: Normal. Negative for fracture or focal lesion. Sinuses/Orbits: No acute finding. Other: None. IMPRESSION: Chronic changes  similar to that seen on the prior exam.  No acute abnormality noted. Electronically Signed   By: Inez Catalina M.D.   On: 09/28/2016 17:42    Procedures Procedures (including critical care time)  Medications Ordered in ED Medications  cloNIDine (CATAPRES) tablet 0.3 mg (0.3 mg Oral Given 09/28/16 1953)     Initial Impression / Assessment and Plan / ED Course  I have reviewed the triage vital signs and the nursing notes.  Pertinent labs & imaging results that were available during my care of the patient were reviewed by me and considered in my medical decision making (see chart for details).     BUN  Date Value Ref Range Status  09/28/2016 29 (H) 6 - 20 mg/dL Final  09/28/2016 22 (H) 6 - 20 mg/dL Final  01/06/2015 21 7 - 25 mg/dL Final  11/05/2014 23 (H) 6 - 20 mg/dL Final  02/15/2014 27 8 - 27 mg/dL Final  10/15/2013 20 8 - 27 mg/dL Final  07/11/2013 26 8 - 27 mg/dL Final  06/06/2013 25 8 - 27 mg/dL Final   Creat  Date Value Ref Range Status  01/06/2015 1.17 (H) 0.70 - 1.11 mg/dL Final   Creatinine, Ser  Date Value Ref Range Status  09/28/2016 1.30 (H) 0.61 - 1.24 mg/dL Final  09/28/2016 1.40 (H) 0.61 - 1.24 mg/dL Final  11/05/2014 1.21 0.61 - 1.24 mg/dL Final  11/04/2014 1.29 (H) 0.61 - 1.24 mg/dL Final    Patient Vitals for the past 24 hrs:  BP Temp Temp src Pulse Resp SpO2 Height Weight  09/28/16 2015 (!) 205/117 - - 65 (!) 21 96 % - -  09/28/16 2011 (!) 220/118 - - - - - - -  09/28/16 1945 (!) 196/94 - - 63 20 96 % - -  09/28/16 1915 (!) 201/112 - - 60 14 98 % - -  09/28/16 1523 (!) 189/100 97.8 F (36.6 C) Oral 76 18 97 % 5\' 11"  (1.803 m) 90.7 kg (200 lb)    8:19 PM Reevaluation with update and discussion. After initial assessment and treatment, an updated evaluation reveals he remains alert, calm and cooperative.  No recurrence of disorientation.  Findings discussed with patient and daughter, all questions answered. Emya Picado L     Final Clinical  Impressions(s) / ED Diagnoses   Final diagnoses:  Transient alteration of awareness  Hypertension, unspecified type   Transient decreased awareness, self-limited.  Doubt CVA, TIA, serious bacterial infection or metabolic instability.  Doubt hypertensive urgency.  Nursing Notes Reviewed/ Care Coordinated Applicable Imaging Reviewed Interpretation of Laboratory Data incorporated into ED treatment  The patient appears reasonably screened and/or stabilized for discharge and I doubt any other medical condition or other [facility] requiring further screening, evaluation, or treatment in the ED at this time prior to discharge.  Plan: Home Medications-continue current medications; Home Treatments-rest, low-salt diet; return here if the recommended treatment, does not improve the symptoms; Recommended follow up-PCP checkup 2 days for repeat blood pressure evaluation and possible intervention if consistently high   New Prescriptions New Prescriptions   No medications on file     Daleen Bo, MD 09/28/16 2020

## 2016-09-29 ENCOUNTER — Telehealth: Payer: Self-pay | Admitting: Cardiovascular Disease

## 2016-09-29 DIAGNOSIS — F4489 Other dissociative and conversion disorders: Secondary | ICD-10-CM | POA: Diagnosis not present

## 2016-09-29 NOTE — Telephone Encounter (Signed)
Patient of Dr. Gwenlyn Found who went to ED yesterday for eval of transient disorientation. Was sent home from ED and recommended f/up in 2 days (w/PCP per EDP note). Patient has already been scheduled to see B. Strader, PA on 9/13 @ 2pm. LM for daughter John Romero with this info and advised to call back if further assistance is needed.

## 2016-09-29 NOTE — Telephone Encounter (Signed)
New Message     Ovid Curd said to take patient to ER yesterday and they could not find anything, they want him to follow  Up with someone in office within next 2 days

## 2016-09-29 NOTE — Telephone Encounter (Signed)
Returned call to John Romero, daughter. Patient had called in yesterday and was advised to go to ED for TIA-like symptoms but everything checked out OK but they could not determine why he had a little "fog". His BP was >397 systolic in ED. Asked daughter John Romero if patient has checked home BPs, she states she advised that he take his home BPs. She states Dr. Felipa Eth has been prescribing his BP meds and she is worried about a PA seeing her father who has not seen him before and changing his medications, etc. Explained that PA is licensed to make medication changes to be treat/manage patient's current condition. We discussed possibility of neuro referral for symptoms concerning for TIA/CVA despite ED r/o. Advised dgtr to discuss with PA tomorrow.

## 2016-09-29 NOTE — Telephone Encounter (Signed)
Follow up     Pt was returning your call , she was aware of the appt, she just needs to know what to do until they bring him in

## 2016-09-30 ENCOUNTER — Ambulatory Visit: Payer: Medicare Other | Admitting: Student

## 2016-10-01 ENCOUNTER — Ambulatory Visit (INDEPENDENT_AMBULATORY_CARE_PROVIDER_SITE_OTHER): Payer: Medicare Other | Admitting: Cardiovascular Disease

## 2016-10-01 ENCOUNTER — Encounter: Payer: Self-pay | Admitting: Cardiovascular Disease

## 2016-10-01 ENCOUNTER — Telehealth: Payer: Self-pay | Admitting: Cardiovascular Disease

## 2016-10-01 DIAGNOSIS — R600 Localized edema: Secondary | ICD-10-CM | POA: Diagnosis not present

## 2016-10-01 MED ORDER — LISINOPRIL 20 MG PO TABS: 20.0000 mg | ORAL_TABLET | Freq: Every day | ORAL | 1 refills | Status: DC

## 2016-10-01 NOTE — Assessment & Plan Note (Signed)
History of coronary artery disease status post bypass grafting in 1995. His last Myoview stress test performed 02/07/15 showed a small area of inferolateral scar with mild peri-infarct ischemia and an ejection fraction of 47%. He denies chest pain or shortness of breath.

## 2016-10-01 NOTE — Assessment & Plan Note (Signed)
Chronic bilateral lower extremity edema on twice a day Lasix and spironolactone. He does admit to dietary indiscretion.

## 2016-10-01 NOTE — Assessment & Plan Note (Signed)
History of hyperlipidemia on statin therapy followed by his PCP 

## 2016-10-01 NOTE — Assessment & Plan Note (Signed)
History of essential hypertension blood pressure measured 158/74. He is on atenolol, lisinopril. I reviewed his outpatient blood pressure readings which are moderately elevated and have decided to increase his lisinopril from 10-20 mg a day.

## 2016-10-01 NOTE — Telephone Encounter (Signed)
Pt c/o BP issue: STAT if pt c/o blurred vision, one-sided weakness or slurred speech  1. What are your last 5 BP readings? 210/100 189/100 198/98 2. Are you having any other symptoms (ex. Dizziness, headache, blurred vision, passed out)? Yes  Dizzy   3. What is your BP issue? high

## 2016-10-01 NOTE — Progress Notes (Signed)
10/01/2016 John Romero   26-Apr-1930  [id number]  Primary Physician [name], MD Primary Cardiologist: [name] MD [name]  HPI:  John Romero is a 83 y.o. male  moderately overweight widowed Caucasian male father of 2, grandfather to one grandchild is retired from working at [employer].I last saw him in the office 10/29/15. He is accompanied by his daughter [name]  today. I saw him in the hospital when he was admitted with hypertensive encephalopathy. He has a history of hypertension, hyperlipidemia and type 2 diabetes. He has chronic kidney disease stage III and new onset A. Fib. He had coronary artery bypass grafting back in 1995. A 2-D echo during his hospitalization showed normal left ventricular function. He saw [name] in the office for his initial posthospital follow-up on 11/19/14 and was doing well at that time. She did have a discussion with him regarding beginning oral anticoagulation given his high CHA2DSVASC2 score (7) however the patient declined at that time wishing to remain on aspirin. Over the last several weeks prior to his December appointment he had progressive weight gain, dyspnea or resting shortness of breath. He was clearly in heart failure and 5 mg 3+ pitting edema. I doubled his diuretics. 2-D echo that showed a decline in his ejection fraction from 55-60% to 40-45% with a new wall motion abnormality. He has slowly diuresed on oral diuretic. A Myoview stress test performed on 02/07/15 showed a small area of inferolateral scar with mild peri-infarct ischemia and an ejection fraction of 47%.  We have talked about initiating oral anticoagulant therapy which he agreed to initiate. We began him on Eliquis . He's been well since. He denies chest pain or shortness of breath. Since I saw him last he's done relatively well. He is somewhat unstable on his feet. He has chronic lower extremity edema and does admit to dietary indiscretion with regards  to salt. He is recently seen in the emergency room with what sounds like a TIA.   Current Meds  Medication Sig  . allopurinol (ZYLOPRIM) 300 MG tablet TAKE 1/2 TABLET DAILY TO PREVENT GOUT ATTACK  . aspirin EC 81 MG tablet Take 1 tablet (81 mg total) by mouth daily.  Marland Kitchen atenolol (TENORMIN) 50 MG tablet Take 25 mg by mouth daily.  . cloNIDine (CATAPRES) 0.3 MG tablet Take one tablet by mouth twice daily for blood pressure  . ELIQUIS 5 MG TABS tablet TAKE 1 TABLET TWICE A DAY  . furosemide (LASIX) 40 MG tablet Take 1 tablet by mouth 2 (two) times daily.   . insulin aspart (NOVOLOG) 100 UNIT/ML injection Inject into the skin as directed. 10 UNITS AM, 6 UNITS LUNCH, AND 10 UNITS PM ALL BEFORE MEALS  . Insulin Detemir (LEVEMIR) 100 UNIT/ML Pen Inject into the skin daily. Pt takes 24 units at night time  . lisinopril (PRINIVIL,ZESTRIL) 10 MG tablet Take 1 tablet (10 mg total) by mouth daily.  . Multiple Vitamin (MULTIVITAMIN) tablet Take 1 tablet by mouth daily.  . simvastatin (ZOCOR) 20 MG tablet Take 20 mg by mouth daily.  Marland Kitchen spironolactone (ALDACTONE) 25 MG tablet Take 25 mg by mouth daily.     No Known Allergies  Social History   Social History  . Marital status: Married    Spouse name: N/A  . Number of children: N/A  . Years of education: N/A   Occupational History  . Not on file.   Social History Main Topics  .  Smoking status: Never Smoker  . Smokeless tobacco: Current User    Types: Chew  . Alcohol use No  . Drug use: No  . Sexual activity: Not on file     Comment: unknown   Other Topics Concern  . Not on file   Social History Narrative  . No narrative on file     Review of Systems: General: negative for chills, fever, night sweats or weight changes.  Cardiovascular: negative for chest pain, dyspnea on exertion, edema, orthopnea, palpitations, paroxysmal nocturnal dyspnea or shortness of breath Dermatological: negative for rash Respiratory: negative for cough or  wheezing Urologic: negative for hematuria Abdominal: negative for nausea, vomiting, diarrhea, bright red blood per rectum, melena, or hematemesis Neurologic: negative for visual changes, syncope, or dizziness All other systems reviewed and are otherwise negative except as noted above.    Blood pressure (!) 158/74, pulse 64, height 5\' 11"  (1.803 m), weight 201 lb (91.2 kg).  General appearance: alert and no distress Neck: no adenopathy, no carotid bruit, no JVD, supple, symmetrical, trachea midline and thyroid not enlarged, symmetric, no tenderness/mass/nodules Lungs: clear to auscultation bilaterally Heart: regular rate and rhythm, S1, S2 normal, no murmur, click, rub or gallop Extremities: 2+ pitting edema bilaterally Pulses: 2+ and symmetric Skin: Skin color, texture, turgor normal. No rashes or lesions Neurologic: Alert and oriented X 3, normal strength and tone. Normal symmetric reflexes. Normal coordination and gait  EKG not performed today  ASSESSMENT AND PLAN:   Lower leg edema Chronic bilateral lower extremity edema on twice a day Lasix and spironolactone. He does admit to dietary indiscretion.  Hyperlipidemia LDL goal <100 History of hyperlipidemia on statin therapy followed by his PCP  Essential hypertension History of essential hypertension blood pressure measured 158/74. He is on atenolol, lisinopril. I reviewed his outpatient blood pressure readings which are moderately elevated and have decided to increase his lisinopril from 10-20 mg a day.  PAF (paroxysmal atrial fibrillation) (HCC) History of paroxysmal atrial fibrillation on Eliquis oral anticoagulation.  Hx of CABG History of coronary artery disease status post bypass grafting in 1995. His last Myoview stress test performed 02/07/15 showed a small area of inferolateral scar with mild peri-infarct ischemia and an ejection fraction of 47%. He denies chest pain or shortness of breath.  Chronic combined systolic and  diastolic CHF (congestive heart failure) ([place]) Patient has combined systolic and diastolic heart failure. He has an EF in the 45% range on oral diuretics. He also is on a beta blocker and an ACE inhibitor.      [name] MD FACP,FACC,FAHA, [facility] 10/01/2016 4:18 PM

## 2016-10-01 NOTE — Assessment & Plan Note (Signed)
History of paroxysmal atrial fibrillation on Eliquis oral anticoagulation. 

## 2016-10-01 NOTE — Patient Instructions (Signed)
Medication Instructions:  INCREASE YOUR LISINOPRIL TO 20 MG  Labwork: NONE  Testing/Procedures: NONE   Follow-Up: Your physician recommends that you schedule a follow-up appointment in: [place]  Your physician wants you to follow-up in: [place] will receive a reminder letter in the mail two months in advance. If you don't receive a letter, please call our office to schedule the follow-up appointment.  If you need a refill on your cardiac medications before your next appointment, please call your pharmacy.

## 2016-10-01 NOTE — Telephone Encounter (Signed)
Spoke to daughter . She states patient blood pressure still elevated. Patient did not go to see primary a few days ago as discussed previously. appointment made for 3:30 pm today with dr berry. Daughter verbalized understanding.

## 2016-10-01 NOTE — Assessment & Plan Note (Signed)
Patient has combined systolic and diastolic heart failure. He has an EF in the 45% range on oral diuretics. He also is on a beta blocker and an ACE inhibitor.

## 2016-10-08 ENCOUNTER — Telehealth: Payer: Self-pay | Admitting: Cardiovascular Disease

## 2016-10-08 NOTE — Telephone Encounter (Signed)
New message   Only wants John Romero or John Romero to call   Pt c/o BP issue: STAT if pt c/o blurred vision, one-sided weakness or slurred speech  1. What are your last 5 BP readings?  200/110 then drops to 104/60  2. Are you having any other symptoms (ex. Dizziness, headache, blurred vision, passed out)?  None of these   3. What is your BP issue? bp keeps going up and down, she is thinking that afib is having a factor in this

## 2016-10-08 NOTE — Telephone Encounter (Signed)
Returned the call to the patient's daughter per the dpr. She stated that the patient's blood pressure has been elevated the last few days:  Yesterday: 171/102 139/83 179/113  Today: 187/109 190/117 193/125  The patient does have atrial fibrillation. Heart rates have been in the 60's to 80's. He is asymptomatic.  At his last visit on 9/14 his lisinopril was increased to 20 mg daily.  Per Dr. Claiborne Billings, DOD, the patient should try atenolol 25mg  twice daily. The patient should monitor his heart rate and blood pressure. If his heart rate declines or his blood pressure doesn't  then he should call the on call provider this weekend. The patient's daughter verbalized her understanding.

## 2016-10-12 ENCOUNTER — Ambulatory Visit: Payer: Medicare Other

## 2016-10-13 ENCOUNTER — Encounter: Payer: Self-pay | Admitting: Podiatry

## 2016-10-13 ENCOUNTER — Ambulatory Visit (INDEPENDENT_AMBULATORY_CARE_PROVIDER_SITE_OTHER): Payer: Medicare Other | Admitting: Podiatry

## 2016-10-13 DIAGNOSIS — E1140 Type 2 diabetes mellitus with diabetic neuropathy, unspecified: Secondary | ICD-10-CM

## 2016-10-13 DIAGNOSIS — Q828 Other specified congenital malformations of skin: Secondary | ICD-10-CM

## 2016-10-13 DIAGNOSIS — M79675 Pain in left toe(s): Secondary | ICD-10-CM

## 2016-10-13 DIAGNOSIS — M79674 Pain in right toe(s): Secondary | ICD-10-CM | POA: Diagnosis not present

## 2016-10-13 DIAGNOSIS — E1149 Type 2 diabetes mellitus with other diabetic neurological complication: Secondary | ICD-10-CM | POA: Diagnosis not present

## 2016-10-13 DIAGNOSIS — B351 Tinea unguium: Secondary | ICD-10-CM | POA: Diagnosis not present

## 2016-10-13 NOTE — Progress Notes (Signed)
Subjective:    Patient ID: John Romero, male   DOB: 83 y.o.   MRN: [id number]   HPI long-term diabetic with very painful lesion right and elongated incurvated nailbeds 1-5 both feet    ROS      Objective:  Physical Exam vascular status diminished both DP and PT pulses with diminished hair growth noted. Patient's found to have mild edema in the ankle region bilateral with negative Homans sign and does have diminished sharp dull and vibratory. I also noted a distal keratotic lesion digit 2 right that's painful and elongated thickened nailbeds 1-5 both feet that are painful and he cannot cut     Assessment:   At risk diabetic with mycotic nail infection and lesion formation     Plan:    H&P condition reviewed and debrided nailbeds 1-5 both feet with no iatrogenic bleeding and lesion second digit right no iatrogenic bleeding and applied padding to lift the toe. Reappoint to recheck as needed

## 2016-10-14 ENCOUNTER — Telehealth: Payer: Self-pay | Admitting: Cardiovascular Disease

## 2016-10-14 NOTE — Telephone Encounter (Signed)
Have pt come in to see a MLP next week to review

## 2016-10-14 NOTE — Telephone Encounter (Signed)
New message     Pt daughter [name] is calling. She is asking for a call back about pt BP after increasing the meds. Pt daughter states that his BP is still high.  Sun 9/23-745a-158/97, 588F-027/74, 7p-180/88 9p-170/94  Mon 9/24-6am-198/97, 12p-176/104, 530p-187/85, 7p-130/77 Tues 9/25-9am-162/94, 1p-134/81, 5pm-171/81, 945pm-167/98  Wed 9/26-815a-165/95, 1130a-124/72, 7p-114/67,

## 2016-10-14 NOTE — Telephone Encounter (Signed)
John Romero(daughter) she states that pt's BP is all over the place the last few days. She states that she has increased his atenolol to 25mg  BID. Nancy Fetter 9/23-745a-158/97, 930a-167/94, 7p-180/88 9p-170/94  Mon 9/24-6am-198/97, 12p-176/104, 530p-187/85, 7p-130/77 Tues 9/25-9am-162/94, 1p-134/81, 5pm-171/81, 945pm-167/98  Wed 9/26-815a-165/95, 1130a-124/72, 7p-114/67,  Thurs  8am 168/88 @930  120/69 She states that she would like to know why BP is running so all over the place  Please call daughter back at [phone]

## 2016-10-15 NOTE — Telephone Encounter (Signed)
Message sent to scheduling to arrange appointment  

## 2016-10-19 ENCOUNTER — Encounter: Payer: Self-pay | Admitting: Cardiology

## 2016-10-19 ENCOUNTER — Ambulatory Visit (INDEPENDENT_AMBULATORY_CARE_PROVIDER_SITE_OTHER): Payer: Medicare Other | Admitting: Cardiology

## 2016-10-19 DIAGNOSIS — N183 Chronic kidney disease, stage 3 unspecified: Secondary | ICD-10-CM

## 2016-10-19 DIAGNOSIS — I110 Hypertensive heart disease with heart failure: Secondary | ICD-10-CM | POA: Diagnosis not present

## 2016-10-19 DIAGNOSIS — I5042 Chronic combined systolic (congestive) and diastolic (congestive) heart failure: Secondary | ICD-10-CM

## 2016-10-19 DIAGNOSIS — I482 Chronic atrial fibrillation, unspecified: Secondary | ICD-10-CM

## 2016-10-19 DIAGNOSIS — Z794 Long term (current) use of insulin: Secondary | ICD-10-CM | POA: Diagnosis not present

## 2016-10-19 DIAGNOSIS — E1121 Type 2 diabetes mellitus with diabetic nephropathy: Secondary | ICD-10-CM | POA: Diagnosis not present

## 2016-10-19 DIAGNOSIS — Z7901 Long term (current) use of anticoagulants: Secondary | ICD-10-CM

## 2016-10-19 DIAGNOSIS — Z951 Presence of aortocoronary bypass graft: Secondary | ICD-10-CM

## 2016-10-19 DIAGNOSIS — R600 Localized edema: Secondary | ICD-10-CM

## 2016-10-19 DIAGNOSIS — I10 Essential (primary) hypertension: Secondary | ICD-10-CM | POA: Diagnosis not present

## 2016-10-19 NOTE — Telephone Encounter (Signed)
Pt scheduled for appointment with [name], PA on 10/19.

## 2016-10-19 NOTE — Assessment & Plan Note (Signed)
CABG '95, Myoview low risk Jan 2017, no chest pain

## 2016-10-19 NOTE — Assessment & Plan Note (Signed)
Bilateral lower extremity edema/chronic

## 2016-10-19 NOTE — Assessment & Plan Note (Signed)
He is in the office today with his daughter because of labile B/P readings, mainly high

## 2016-10-19 NOTE — Progress Notes (Addendum)
10/19/2016 John Romero   11-Jun-1930  [id number]  Primary Physician [name], MD Primary Cardiologist: Dr Gwenlyn Found  HPI:  83 y/o male with CAD, s/p remote CABG, Myoview in Jan 2017 showed a small area of inferolateral scar with mild peri-infarct ischemia and an ejection fraction of 47%. Other problems include hyperlipidemia, type 2 IDDM diabetes, chronic kidney disease stage III, chronic A. Fib on Eliquis, and labile HTN. He is in the office today with his daughter. The pt's B/P at home has been labile, mostly high in the afternoon in the 170/100 range. Today in the office after his morning medications his B/P is 90 systolic. We checked this against his home cuff and it was accurate. The pt is asymptomatic but he is pretty stoic.    Current Outpatient Prescriptions  Medication Sig Dispense Refill  . allopurinol (ZYLOPRIM) 300 MG tablet TAKE 1/2 TABLET DAILY TO PREVENT GOUT ATTACK 90 tablet 1  . aspirin EC 81 MG tablet Take 1 tablet (81 mg total) by mouth daily. 90 tablet 3  . atenolol (TENORMIN) 25 MG tablet Take 25 mg by mouth 2 (two) times daily. Doesn't take afternoon dose if pulse is under 50 bpm    . cloNIDine (CATAPRES) 0.3 MG tablet Take one tablet by mouth twice daily for blood pressure 60 tablet 0  . ELIQUIS 5 MG TABS tablet TAKE 1 TABLET TWICE A DAY 180 tablet 1  . furosemide (LASIX) 40 MG tablet Take 1 tablet by mouth 2 (two) times daily.     . insulin aspart (NOVOLOG) 100 UNIT/ML injection Inject into the skin as directed. 10 UNITS AM, 6 UNITS LUNCH, AND 10 UNITS PM ALL BEFORE MEALS    . Insulin Detemir (LEVEMIR) 100 UNIT/ML Pen Inject into the skin daily. Pt takes 24 units at night time    . lisinopril (PRINIVIL,ZESTRIL) 20 MG tablet Take 20 mg by mouth at bedtime.    . Multiple Vitamin (MULTIVITAMIN) tablet Take 1 tablet by mouth daily.    . simvastatin (ZOCOR) 20 MG tablet Take 20 mg by mouth daily.    Marland Kitchen spironolactone (ALDACTONE) 25 MG tablet Take 25 mg by mouth daily.      No current facility-administered medications for this visit.     No Known Allergies  Past Medical History:  Diagnosis Date  . CAD (coronary artery disease)    a. s/p CABG, details unclear.  . CKD (chronic kidney disease), stage III ([place])   . Edema   . Essential hypertension   . First degree atrioventricular block   . Gout, unspecified   . Gout, unspecified   . Hyperlipidemia   . Hypertensive encephalopathy 10/2014  . Hypertensive heart disease    a. 2D echo 11/01/14: hypokinesis of basal inferior and inferolateral walls, severe focal basal hypertrophy of the septum, EF 55-60%, mild AI, mod LAE.   . Malignant neoplasm of prostate ([place])   . Old myocardial infarction   . PAF (paroxysmal atrial fibrillation) ([place])    a. Dx 10/2014.   . Stroke (cerebrum) ([place])    a. CT head 10/2014 showed occult strokes: "Old RIGHT basal ganglia lacunar infarct, small bilateral cerebellar infarcts."  . Type II or unspecified type diabetes mellitus without mention of complication, uncontrolled   . Unspecified tinnitus     Social History   Social History  . Marital status: Married    Spouse name: N/A  . Number of children: N/A  . Years of education: N/A   Occupational History  .  Not on file.   Social History Main Topics  . Smoking status: Never Smoker  . Smokeless tobacco: Current User    Types: Chew  . Alcohol use No  . Drug use: No  . Sexual activity: Not on file     Comment: unknown   Other Topics Concern  . Not on file   Social History Narrative  . No narrative on file     Family History  Problem Relation Age of Onset  . Diabetes Mother   . Hypertension Mother   . Heart disease Father   . Cancer Sister        lung  . Cancer Sister        lung  . Heart attack Neg Hx   . Stroke Neg Hx      Review of Systems: General: negative for chills, fever, night sweats or weight changes.  Cardiovascular: negative for chest pain, dyspnea on exertion, edema, orthopnea,  palpitations, paroxysmal nocturnal dyspnea or shortness of breath Dermatological: negative for rash Respiratory: negative for cough or wheezing Urologic: negative for hematuria Abdominal: negative for nausea, vomiting, diarrhea, bright red blood per rectum, melena, or hematemesis Neurologic: negative for visual changes, syncope, or dizziness All other systems reviewed and are otherwise negative except as noted above.    Blood pressure (!) 90/52, pulse 67, height 5\' 11"  (1.803 m), weight 196 lb 6.4 oz (89.1 kg).  General appearance: alert, cooperative and no distress Neck: no JVD Lungs: fine basilar crackles Heart: irregularly irregular rhythm Extremities: 1-2+ chronic LE edema Neurologic: Grossly normal   ASSESSMENT AND PLAN:   Essential hypertension He is in the office today with his daughter because of labile B/P readings, mainly high  Atrial fibrillation ([place]) This appears to be permanent.   Chronic combined systolic and diastolic CHF (congestive heart failure) (HCC) No CHF on exam  Chronic anticoagulation Eliquis-CHADs VASc= 8 for age, CHF, HTN, DM, H/O CVA, and vascular disease  Hypertensive cardiovascular disease EF 45-50%, mild LVH- echo Dec 2016  Hx of CABG CABG '95, Myoview low risk Jan 2017, no chest pain  Chronic kidney disease, stage 3 Last SCr 1.3  DM (diabetes mellitus), type 2 with renal complications (HCC) IDDM followed by PCP  Lower leg edema Bilateral lower extremity edema/chronic   PLAN  After reviewing his medications in detail with the pt and his daughter I suggested a few changes. First I suggested he take his Lisinopril at night. Second I suggested we change his clonidine 0.3 mg BID to a catapres TTS 0.2 mg patch. I told him it would be more expensive ( 60 dollars a month vs 4 dollars a month) but give him more even coverage. The other option would be to change his clonidine to 0.2 mg TID.  I would avoid Amlodipine with his LE edema. They will  consider the options and let us know.   [name] PA-C 10/19/2016 10:33 AM

## 2016-10-19 NOTE — Assessment & Plan Note (Signed)
EF 45-50%, mild LVH- echo Dec 2016

## 2016-10-19 NOTE — Assessment & Plan Note (Signed)
IDDM followed by PCP 

## 2016-10-19 NOTE — Patient Instructions (Addendum)
John Romero, [place], has recommended making the following medication changes: 1. TAKE Lisinopril at bedtime  Please call the office when you decide what you would like to do with your medications. [name] has reviewed the options with you today. - change clonidine to 0.2 mg tablets THREE times daily = $4 for a 30-day supply OR - change clonidine to Catapres 0.2 mg TTS 7-day patch once weekly = $60 for a 30-day supply  [name] recommends that you schedule a follow-up appointment with him in 3 weeks.  If you need a refill on your cardiac medications before your next appointment, please call your pharmacy.

## 2016-10-19 NOTE — Assessment & Plan Note (Signed)
No CHF on exam

## 2016-10-19 NOTE — Assessment & Plan Note (Signed)
Eliquis-CHADs VASc= 8 for age, CHF, HTN, DM, H/O CVA, and vascular disease

## 2016-10-19 NOTE — Assessment & Plan Note (Signed)
Last SCr 1.3

## 2016-10-19 NOTE — Assessment & Plan Note (Signed)
This appears to be permanent.

## 2016-10-20 ENCOUNTER — Ambulatory Visit: Payer: Medicare Other | Admitting: Physician Assistant

## 2016-10-28 ENCOUNTER — Telehealth: Payer: Self-pay | Admitting: Cardiology

## 2016-10-28 NOTE — Telephone Encounter (Signed)
New message       *STAT* If patient is at the pharmacy, call can be transferred to refill team.   1. Which medications need to be refilled? (please list name of each medication and dose if known)  Clonidine patch  2. Which pharmacy/location (including street and city if local pharmacy) is medication to be sent to? [facility] 3. Do they need a 30 day or 90 day supply?  Pt request an 84 day supply Pt states that he was taking catapres pill and [name] want him to start using the patch.  He was the generic clonidine instead of catapres.  Please call pt and let him know if this is ok

## 2016-10-29 ENCOUNTER — Other Ambulatory Visit: Payer: Self-pay | Admitting: *Deleted

## 2016-10-29 MED ORDER — CLONIDINE HCL 0.3 MG PO TABS: ORAL_TABLET | ORAL | 1 refills | Status: DC

## 2016-11-01 ENCOUNTER — Other Ambulatory Visit: Payer: Self-pay | Admitting: Cardiology

## 2016-11-01 NOTE — Telephone Encounter (Signed)
Fine with me

## 2016-11-01 NOTE — Telephone Encounter (Signed)
S/w pt he states that at his last appt they discussed clonidine catapres patch. He states that he would like to start using the patch as discussed at his last visit. Of to change?

## 2016-11-01 NOTE — Telephone Encounter (Signed)
Follow up ° ° °Pt is returning call to nurse.  °

## 2016-11-01 NOTE — Telephone Encounter (Signed)
New message    Pt is calling asking for a call back. He said his power has been out and isnt sure if someone tried to call him back from message last week.

## 2016-11-02 MED ORDER — CLONIDINE HCL 0.2 MG/24HR TD PTWK: 0.2000 mg | MEDICATED_PATCH | TRANSDERMAL | 1 refills | Status: DC

## 2016-11-02 NOTE — Telephone Encounter (Signed)
Follow up  ° ° ° ° °Pt is returning your call  °

## 2016-11-02 NOTE — Telephone Encounter (Signed)
Hypertension clinic is NOT currently following this patient.  General instructions for clonidine patch are:  catapres TTS 0.3mg  every week  Overlap with oral clonidine for 24 hours, then discontinue oral form.  Additional recommendation:  Monitor BP twice daily for 2-3 weeks and call clinic if any problem of questions.

## 2016-11-02 NOTE — Telephone Encounter (Signed)
Lm2cb-to discuss patch. Pt has f/u appt w/Luke 11-05-16   Per last OV with [name]:  Please call the office when you decide what you would like to do with your medications. [name] has reviewed the options with you today.....Marland Kitchen - change clonidine to Catapres 0.2 mg TTS 7-day patch once weekly = $60 for a 30-day supply

## 2016-11-02 NOTE — Telephone Encounter (Signed)
Returned call to patient. He checked with [facility] and he can get clonidine patch for $26/84 day supply. He would like to transition from pill to patch. Instructions provided per [facility]. He wrote them down. Rx(s) sent to pharmacy electronically.

## 2016-11-03 NOTE — Telephone Encounter (Signed)
Thanks

## 2016-11-04 ENCOUNTER — Telehealth: Payer: Self-pay | Admitting: Cardiology

## 2016-11-04 MED ORDER — CLONIDINE HCL 0.2 MG/24HR TD PTWK: 0.2000 mg | MEDICATED_PATCH | TRANSDERMAL | 1 refills | Status: DC

## 2016-11-04 NOTE — Telephone Encounter (Signed)
New message   Patient wants confirmation when script has been sent to pharmacy.  *STAT* If patient is at the pharmacy, call can be transferred to refill team.   1. Which medications need to be refilled? (please list name of each medication and dose if known) cloNIDine (CATAPRES - DOSED IN MG/24 HR) 0.2 mg/24hr patch  2. Which pharmacy/location (including street and city if local pharmacy) is medication to be sent to? [facility] - [place]  3. Do they need a 30 day or 90 day supply? 84 days

## 2016-11-04 NOTE — Telephone Encounter (Signed)
CALLED INTO PHARMACY-will not send electronically

## 2016-11-04 NOTE — Telephone Encounter (Signed)
Called into [place] pharmacist

## 2016-11-05 ENCOUNTER — Other Ambulatory Visit: Payer: Self-pay | Admitting: *Deleted

## 2016-11-05 ENCOUNTER — Encounter: Payer: Self-pay | Admitting: Cardiology

## 2016-11-05 ENCOUNTER — Ambulatory Visit (INDEPENDENT_AMBULATORY_CARE_PROVIDER_SITE_OTHER): Payer: Medicare Other | Admitting: Cardiology

## 2016-11-05 VITALS — BP 182/84 | HR 73

## 2016-11-05 DIAGNOSIS — I119 Hypertensive heart disease without heart failure: Secondary | ICD-10-CM

## 2016-11-05 DIAGNOSIS — N183 Chronic kidney disease, stage 3 unspecified: Secondary | ICD-10-CM

## 2016-11-05 DIAGNOSIS — Z951 Presence of aortocoronary bypass graft: Secondary | ICD-10-CM | POA: Diagnosis not present

## 2016-11-05 DIAGNOSIS — I4819 Other persistent atrial fibrillation: Secondary | ICD-10-CM

## 2016-11-05 DIAGNOSIS — Z7901 Long term (current) use of anticoagulants: Secondary | ICD-10-CM

## 2016-11-05 DIAGNOSIS — I10 Essential (primary) hypertension: Secondary | ICD-10-CM | POA: Diagnosis not present

## 2016-11-05 DIAGNOSIS — I481 Persistent atrial fibrillation: Secondary | ICD-10-CM

## 2016-11-05 MED ORDER — LISINOPRIL 20 MG PO TABS: 20.0000 mg | ORAL_TABLET | Freq: Every day | ORAL | 3 refills | Status: DC

## 2016-11-05 NOTE — Patient Instructions (Signed)
Medication Instructions:  Continue current medications If you need a refill on your cardiac medications before your next appointment, please call your pharmacy.  Labwork: None Ordered  Testing/Procedures: None Ordered  Special Instructions: Continue clonidine 0.2 mg by mouth twice a day until Monday PM, Start clonidine patch Sunday.  Follow-Up: Your physician wants you to follow-up in: 4 week for blood pressure check with Pharmacist.  Your physician recommends that you schedule a follow-up appointment in: 6 Months with Dr Gwenlyn Found.   Thank you for choosing [facility] at [facility]!!

## 2016-11-05 NOTE — Progress Notes (Signed)
11/05/2016 John Romero   [birth year]  [id number]  Primary Physician [name], MD Primary Cardiologist: Dr Gwenlyn Found  HPI:  83 y/o male with CAD, s/p remote CABG in 1995, details unclear. He was seen by Dr Gwenlyn Found initially as a consult in 2016. He had a Myoview in Jan 2017 that showed a small area of inferolateral scar with mild peri-infarct ischemia and an ejection fraction of 47%. Other problems include HLD,type 2 IDDM,CRI stage III, CAF on Eliquis, and labile HTN. He was seen in the office 10/19/16 with complaints of labile B/P.  I changed his ACE to QHS and suggested he change Clonidine to a Catapres patch (he was not taking his clonidine TID on a regular basis, usually he took it BID). I did not want to use Norvasc secondary to his chronic LE edema. I felt the clonidine patch may give more consistent coverage.   He is in the office today for follow up. Unfortunately the pt has not yet filled his Rx for clonidine patch. There was some back and forth with his insurance and the pharmacy as well as the fact the pt wanted to see if changing his lisinopril to Q HS would resolve his B/P issues.    Current Outpatient Prescriptions  Medication Sig Dispense Refill  . allopurinol (ZYLOPRIM) 300 MG tablet TAKE 1/2 TABLET DAILY TO PREVENT GOUT ATTACK 90 tablet 1  . aspirin EC 81 MG tablet Take 1 tablet (81 mg total) by mouth daily. 90 tablet 3  . atenolol (TENORMIN) 25 MG tablet Take 25 mg by mouth 2 (two) times daily. Doesn't take afternoon dose if pulse is under 50 bpm    . cloNIDine (CATAPRES - DOSED IN MG/24 HR) 0.2 mg/24hr patch Place 1 patch (0.2 mg total) onto the skin once a week. 12 patch 1  . ELIQUIS 5 MG TABS tablet TAKE 1 TABLET TWICE A DAY 180 tablet 1  . furosemide (LASIX) 40 MG tablet Take 1 tablet by mouth 2 (two) times daily.     . insulin aspart (NOVOLOG) 100 UNIT/ML injection Inject into the skin as directed. 10 UNITS AM, 6 UNITS LUNCH, AND 10 UNITS PM ALL BEFORE MEALS    .  Insulin Detemir (LEVEMIR) 100 UNIT/ML Pen Inject into the skin daily. Pt takes 24 units at night time    . lisinopril (PRINIVIL,ZESTRIL) 20 MG tablet Take 20 mg by mouth at bedtime.    . Multiple Vitamin (MULTIVITAMIN) tablet Take 1 tablet by mouth daily.    . simvastatin (ZOCOR) 20 MG tablet Take 20 mg by mouth daily.    Marland Kitchen spironolactone (ALDACTONE) 25 MG tablet Take 25 mg by mouth daily.     No current facility-administered medications for this visit.     No Known Allergies  Past Medical History:  Diagnosis Date  . CAD (coronary artery disease)    a. s/p CABG, details unclear.  . CKD (chronic kidney disease), stage III ([place])   . Edema   . Essential hypertension   . First degree atrioventricular block   . Gout, unspecified   . Gout, unspecified   . Hyperlipidemia   . Hypertensive encephalopathy 10/2014  . Hypertensive heart disease    a. 2D echo 11/01/14: hypokinesis of basal inferior and inferolateral walls, severe focal basal hypertrophy of the septum, EF 55-60%, mild AI, mod LAE.   . Malignant neoplasm of prostate ([place])   . Old myocardial infarction   . PAF (paroxysmal atrial fibrillation) ([place])  a. Dx 10/2014.   . Stroke (cerebrum) ([place])    a. CT head 10/2014 showed occult strokes: "Old RIGHT basal ganglia lacunar infarct, small bilateral cerebellar infarcts."  . Type II or unspecified type diabetes mellitus without mention of complication, uncontrolled   . Unspecified tinnitus     Social History   Social History  . Marital status: Married    Spouse name: N/A  . Number of children: N/A  . Years of education: N/A   Occupational History  . Not on file.   Social History Main Topics  . Smoking status: Never Smoker  . Smokeless tobacco: Current User    Types: Chew  . Alcohol use No  . Drug use: No  . Sexual activity: Not on file     Comment: unknown   Other Topics Concern  . Not on file   Social History Narrative  . No narrative on file     Family  History  Problem Relation Age of Onset  . Diabetes Mother   . Hypertension Mother   . Heart disease Father   . Cancer Sister        lung  . Cancer Sister        lung  . Heart attack Neg Hx   . Stroke Neg Hx      Review of Systems: General: negative for chills, fever, night sweats or weight changes.  Cardiovascular: negative for chest pain, dyspnea on exertion, edema, orthopnea, palpitations, paroxysmal nocturnal dyspnea or shortness of breath Dermatological: negative for rash Respiratory: negative for cough or wheezing Urologic: negative for hematuria Abdominal: negative for nausea, vomiting, diarrhea, bright red blood per rectum, melena, or hematemesis Neurologic: negative for visual changes, syncope, or dizziness All other systems reviewed and are otherwise negative except as noted above.    There were no vitals taken for this visit.  General appearance: alert, cooperative and no distress Extremities: chronic edema in both LE Neurologic: Grossly normal   ASSESSMENT AND PLAN:   Essential hypertension The pt has had labile B/P, mainly high.   Atrial fibrillation ([place]) This appears to be permanent.   Chronic combined systolic and diastolic CHF (congestive heart failure) (HCC) No CHF on exam  Chronic anticoagulation Eliquis-CHADs VASc= 8 for age, CHF, HTN, DM, H/O CVA, and vascular disease  Hypertensive cardiovascular disease EF 45-50%, mild LVH- echo Dec 2016  Hx of CABG CABG '95, Myoview low risk Jan 2017, no chest pain  Chronic kidney disease, stage 3 Last SCr 1.3  DM (diabetes mellitus), type 2 with renal complications (HCC) IDDM followed by PCP  Lower leg edema Chronic  PLAN  I discussed the plan with the pt, his daughter, and our pharmacist. He will start the clonidine 0.2 mg patch Sunday (I called the pharmacy to make sure they had it). He'll overlap with po clonidine 48 hrs, last po dose Monday PM. F/U B/P check in 4 weeks with our  pharmacist, 6 mos with Dr Gwenlyn Found.   [name] PA-C 11/05/2016 8:04 AM

## 2016-11-09 ENCOUNTER — Telehealth: Payer: Self-pay | Admitting: Cardiology

## 2016-11-09 NOTE — Telephone Encounter (Signed)
Returned call to patient's daughter [name] no answer.[place].

## 2016-11-09 NOTE — Telephone Encounter (Signed)
Received a call from patient's daughter she stated father took last dose of clonidine 0.3 mg pill this past Sunday night and applied clonidine 0.2 mg patch yesterday.Stated B/P elevated as listed below.Spoke to [employer] PA he advised ok to take clonidine 0.3 mg pill twice a day for 2 days then stop.Continue to monitor B/P and call back if continues to be elevated.

## 2016-11-09 NOTE — Telephone Encounter (Signed)
New message   Patient daughter calling. Concerned medication is not controlling BP. Please call [phone] Pt c/o BP issue:  1. What are your last 5 BP readings? 155/116, 210/117, 194/111 2. Are you having any other symptoms (ex. Dizziness, headache, blurred vision, passed out)? NO 3. What is your medication issue?  Blood pressure not controlled  Pt c/o medication issue:  1. Name of Medication: cloNIDine (CATAPRES - DOSED IN MG/24 HR) 0.2 mg/24hr patch 2. How are you currently taking this medication (dosage and times per day)?as prescribed   3. Are you having a reaction (difficulty breathing--STAT)? NO  4. What is your medication issue? Blood pressure

## 2016-11-10 ENCOUNTER — Ambulatory Visit: Payer: Medicare Other | Admitting: Cardiovascular Disease

## 2016-11-10 NOTE — Telephone Encounter (Signed)
Spoke with pt dtr, aware of recommendations.

## 2016-11-12 ENCOUNTER — Telehealth: Payer: Self-pay | Admitting: Cardiology

## 2016-11-12 MED ORDER — HYDRALAZINE HCL 25 MG PO TABS: 25.0000 mg | ORAL_TABLET | Freq: Three times a day (TID) | ORAL | 3 refills | Status: DC

## 2016-11-12 NOTE — Telephone Encounter (Signed)
Appt cancelled as directed per [facility]

## 2016-11-12 NOTE — Telephone Encounter (Signed)
Thanks for your input Cyril Mourning! I would probably go with Hydrazine 25 mg TID as opposed to increasing his Aldactone. Sharyn Lull please arrange for the pt to start Hydralazine 25 mg TID-continue Catapres TTS patch, stop po Clonidine once he starts the Hydralazine.

## 2016-11-12 NOTE — Telephone Encounter (Signed)
New message    Patient daughter [name] calling regarding patient blood pressure. Has concerns medication needs to be increased.  Please call  Pt c/o medication issue:  1. Name of Medication: cloNIDine (CATAPRES - DOSED IN MG/24 HR) 0.2 mg/24hr patch  2. How are you currently taking this medication (dosage and times per day)? As prescribed  3. Are you having a reaction (difficulty breathing--STAT)? NO  4. What is your medication issue? Feels patch is not working    Pt c/o BP issue: STAT if pt c/o blurred vision, one-sided weakness or slurred speech  1. What are your last 5 BP readings? 176/94 with HR 70, 210/122  2. Are you having any other symptoms (ex. Dizziness, headache, blurred vision, passed out)? NO  3. What is your BP issue? Feels clonidine patch not working

## 2016-11-12 NOTE — Telephone Encounter (Signed)
I would suggest that we need to try pushing the spironolactone dose.  His potassium looks good and we can try 25 mg bid then increase to 50 mg bid if his kidneys will let us.  The other thing to try is hydralazine - no renal effects.   Start with 25 tid and can titrate to 100 mg tid.

## 2016-11-12 NOTE — Telephone Encounter (Signed)
I spoke with the pt's daughter they are not happy with the medication changes suggested and want to speak with Dr Gwenlyn Found.  [name] PA-C 11/12/2016 4:59 PM

## 2016-11-12 NOTE — Telephone Encounter (Signed)
John Romero(daughter) left detailed message to start hydralazine 25mg  TID and stop clonidine PO. Continue to take BP and call back with any issues/sx.  [facility]

## 2016-11-12 NOTE — Telephone Encounter (Signed)
Patient daughter [name] called back and she states that she WILL NOT STOP THE CLONIDINE TABLET. She states that she needs to talk to [facility] ASAP to discuss this. She states that until she talks with [name] pt will continue clonidine patch and 0.3mg  tablet BID. She states that she thinks that changing al these  medications is damaging pt's heart. daughter understands that [name] may not respond in a timely manner (as he is not in the office-she will await a call)and this is fine she states that she just wants to speak with him before starting a new medication. Scheduled appt with luke 11-17-16 to discuss in person just in case he is unable to discuss before that.

## 2016-11-12 NOTE — Telephone Encounter (Signed)
I called and spoke with the patient's daughter. I tried to explain the reasoning behind the medication changes we had suggested. She is not happy with the medication changes and says she will continue with the clonidine Patch and PO clonidine, which I told her was OK for now. She wants to speak with Dr Gwenlyn Found, not me, or the pharmacist. The pt does not need to keep the appointment with me on 11/17/16 since I have nothing to offer here.   [name] PA-C 11/12/2016 4:56 PM

## 2016-11-12 NOTE — Telephone Encounter (Signed)
Left detailed message will defer to Dr Gwenlyn Found.

## 2016-11-12 NOTE — Telephone Encounter (Signed)
I'm at a loss. We did not want to use Ca++ blocker secondary to chronic LE edema. Hesitant to increase ACE secondary to CRI-3. I would ask pharmacy if they have any suggestions.

## 2016-11-16 NOTE — Telephone Encounter (Signed)
Per Dr. Gwenlyn Found, can either schedule an appt with him to discuss, or schedule an appt with PharmD who will be able to spend more time and discuss the medications with them. Will call pt this afternoon.

## 2016-11-16 NOTE — Telephone Encounter (Signed)
Please call,John Romero said he was turning over all of the pt information to Dr Gwenlyn Found about his blood pressure. She said they are waiting to hear something please.

## 2016-11-16 NOTE — Telephone Encounter (Signed)
lmtcb to discuss Dr. Kennon Holter recommendations for appt.

## 2016-11-17 ENCOUNTER — Telehealth: Payer: Self-pay | Admitting: Cardiovascular Disease

## 2016-11-17 ENCOUNTER — Ambulatory Visit: Payer: Medicare Other | Admitting: Cardiology

## 2016-11-17 NOTE — Telephone Encounter (Signed)
Spoke to pt and pt daughter. Appointment has been scheduled with Dr. Gwenlyn Found on 11/2 to try to find a solution for BP med. Pt daughter stated his BP has been running 220s/120s, the clonidine patch is not working. She also stated she thought the point of changing to the patch was to avoid taking a TID med, stated she would really prefer to speak with Dr. Gwenlyn Found about this and see what he would like to do.   Told pt daughter I will also keep appt with PharmD scheduled for 11/20 as he will likely need a BP f/u appt after seeing Dr. Gwenlyn Found.  Pt daughter verbalized understanding and thanks.  Routing to Dr. Gwenlyn Found as Juluis Rainier.

## 2016-11-17 NOTE — Telephone Encounter (Signed)
New message     Patient daughter returning call.  Please call

## 2016-11-17 NOTE — Telephone Encounter (Signed)
Follow up ° ° ° °Pt is returning call from yesterday. °

## 2016-11-19 ENCOUNTER — Ambulatory Visit (INDEPENDENT_AMBULATORY_CARE_PROVIDER_SITE_OTHER): Payer: Medicare Other | Admitting: Cardiovascular Disease

## 2016-11-19 ENCOUNTER — Encounter: Payer: Self-pay | Admitting: Cardiovascular Disease

## 2016-11-19 VITALS — BP 142/82 | HR 64 | Ht 71.0 in | Wt 200.0 lb

## 2016-11-19 DIAGNOSIS — I129 Hypertensive chronic kidney disease with stage 1 through stage 4 chronic kidney disease, or unspecified chronic kidney disease: Secondary | ICD-10-CM

## 2016-11-19 MED ORDER — HYDRALAZINE HCL 25 MG PO TABS: 25.0000 mg | ORAL_TABLET | Freq: Three times a day (TID) | ORAL | 3 refills | Status: DC

## 2016-11-19 NOTE — Patient Instructions (Addendum)
Medication Instructions: Increase Clonidine 0.3 mg to three times daily.  START Hydralazine 25 mg three times daily.  Testing/Procedures: Your physician has requested that you have a renal artery duplex. During this test, an ultrasound is used to evaluate blood flow to the kidneys. Allow one hour for this exam. Do not eat after midnight the day before and avoid carbonated beverages. Take your medications as you usually do.  Follow-Up: We request that you follow-up in: 3 months with [name], PA and in 6 months with Dr Andria Rhein will receive a reminder letter in the mail two months in advance. If you don't receive a letter, please call our office to schedule the follow-up appointment.  Please keep your appointment with the Pharmacist--date/time listed below. Your physician has requested that you regularly monitor and record your blood pressure readings at home. Please use the same machine at the same time of day to check your readings and record them to bring to your follow-up visit.  If you need a refill on your cardiac medications before your next appointment, please call your pharmacy. Please give me a call Lovena Le) when you would like me to send new Rx for Clonidine to [facility].

## 2016-11-19 NOTE — Progress Notes (Signed)
11/19/2016 Jonathon John Romero   1930-12-30  [id number]  Primary Physician [name], MD Primary Cardiologist: [name] MD [name]  HPI:  John Romero is a 83 y.o. male moderately overweight widowed Caucasian male father of 2, grandfather to one grandchild is retired from working at [employer].I last saw him in the office 10/01/16. He is accompanied by his daughter [name]  today. I saw him in the hospital when he was admitted with hypertensive encephalopathy. He has a history of hypertension, hyperlipidemia and type 2 diabetes. He has chronic kidney disease stage III and new onset A. Fib. He had coronary artery bypass grafting back in 1995. A 2-D echo during his hospitalization showed normal left ventricular function. He saw [name] [facility] in the office for his initial posthospital follow-up on 11/19/14 and was doing well at that time. She did have a discussion with him regarding beginning oral anticoagulation given his high CHA2DSVASC2 score (7) however the patient declined at that time wishing to remain on aspirin. Over the last several weeks prior to his December appointment he had progressive weight gain, dyspnea or resting shortness of breath. He was clearly in heart failure and 5 mg 3+ pitting edema. I doubled his diuretics. 2-D echo that showed a decline in his ejection fraction from 55-60% to 40-45% with a new wall motion abnormality. He has slowly diuresed on oral diuretic. A Myoview stress test performed on 02/07/15 showed a small area of inferolateral scar with mild peri-infarct ischemia and an ejection fraction of 47%. We have talked about initiating oral anticoagulant therapy which he agreed to initiate. We began him on Eliquis . He's been well since. He denies chest pain or shortness of breath. Since I saw him last he's done relatively well. He is somewhat unstable on his feet. He has chronic lower extremity edema and does admit to dietary indiscretion with regards  to salt. He was seen in the emergency room with what sounds like a TIA. He has been seen by [name] PA-C in the office on 11/05/16 to adjust his antihypertensive medications. He does have labile hypertension. We discussed the importance of salt restriction of 2 g a day I'm going to begin him on hydralazine 3 times a day addition to his clonidine by mouth 3 times a day and discontinue his Catapres patch.   Current Meds  Medication Sig  . allopurinol (ZYLOPRIM) 300 MG tablet TAKE 1/2 TABLET DAILY TO PREVENT GOUT ATTACK  . aspirin EC 81 MG tablet Take 1 tablet (81 mg total) by mouth daily.  Marland Kitchen atenolol (TENORMIN) 25 MG tablet Take 25 mg by mouth 2 (two) times daily. Doesn't take afternoon dose if pulse is under 50 bpm  . cloNIDine (CATAPRES) 0.3 MG tablet   . ELIQUIS 5 MG TABS tablet TAKE 1 TABLET TWICE A DAY  . furosemide (LASIX) 40 MG tablet Take 1 tablet by mouth 2 (two) times daily.   . insulin aspart (NOVOLOG) 100 UNIT/ML injection Inject into the skin as directed. 10 UNITS AM, 6 UNITS LUNCH, AND 10 UNITS PM ALL BEFORE MEALS  . Insulin Detemir (LEVEMIR) 100 UNIT/ML Pen Inject into the skin daily. Pt takes 24 units at night time  . lisinopril (PRINIVIL,ZESTRIL) 20 MG tablet Take 1 tablet (20 mg total) by mouth at bedtime.  . Multiple Vitamin (MULTIVITAMIN) tablet Take 1 tablet by mouth daily.  . simvastatin (ZOCOR) 20 MG tablet Take 20 mg by mouth daily.  Marland Kitchen  spironolactone (ALDACTONE) 25 MG tablet Take 25 mg by mouth daily.  . [DISCONTINUED] cloNIDine (CATAPRES - DOSED IN MG/24 HR) 0.2 mg/24hr patch Place 1 patch (0.2 mg total) onto the skin once a week.     No Known Allergies  Social History   Social History  . Marital status: Married    Spouse name: N/A  . Number of children: N/A  . Years of education: N/A   Occupational History  . Not on file.   Social History Main Topics  . Smoking status: Never Smoker  . Smokeless tobacco: Current User    Types: Chew  . Alcohol use No    . Drug use: No  . Sexual activity: Not on file     Comment: unknown   Other Topics Concern  . Not on file   Social History Narrative  . No narrative on file     Review of Systems: General: negative for chills, fever, night sweats or weight changes.  Cardiovascular: negative for chest pain, dyspnea on exertion, edema, orthopnea, palpitations, paroxysmal nocturnal dyspnea or shortness of breath Dermatological: negative for rash Respiratory: negative for cough or wheezing Urologic: negative for hematuria Abdominal: negative for nausea, vomiting, diarrhea, bright red blood per rectum, melena, or hematemesis Neurologic: negative for visual changes, syncope, or dizziness All other systems reviewed and are otherwise negative except as noted above.    Blood pressure (!) 142/82, pulse 64, height 5\' 11"  (1.803 m), weight 200 lb (90.7 kg).  General appearance: alert and no distress Neck: no adenopathy, no carotid bruit, no JVD, supple, symmetrical, trachea midline and thyroid not enlarged, symmetric, no tenderness/mass/nodules Lungs: clear to auscultation bilaterally Heart: regular rate and rhythm, S1, S2 normal, no murmur, click, rub or gallop Extremities: -2+ pitting edema bilaterally Pulses: 1-2+ pitting edema bilaterally Skin: Skin color, texture, turgor normal. No rashes or lesions Neurologic: Alert and oriented X 3, normal strength and tone. Normal symmetric reflexes. Normal coordination and gait  EKG not performed today  ASSESSMENT AND PLAN:   Hypertensive renal disease Serum creatinines in the 1.3-1.4 range. He is on lisinopril. The blood pressure log provided by him reveals blood pressures as high as 190/110 although today is much lower. He is keeping his blood pressures on daily basis. I'm going to discontinue his Catapres patch and put him on clonidine 0.3 mg by mouth 3 times a day in addition to hydralazine 25 mg by mouth 3 times a day. He'll see Erasmo Downer back in on the 20th  of this month for review as of his blood pressure log and medication titration. I agree the Norvasc is probably not the best drug for him because of his lower extremity edema. We will also get renal Doppler studies to rule out renal vascular hypertension.  PAF (paroxysmal atrial fibrillation) ([place]) History of PAF currently on Eliquis oral anticoagulation with a high CHA2DsVASc score      [name] MD [facility], [facility] 11/19/2016 9:26 AM

## 2016-11-19 NOTE — Assessment & Plan Note (Signed)
Serum creatinines in the 1.3-1.4 range. He is on lisinopril. The blood pressure log provided by him reveals blood pressures as high as 190/110 although today is much lower. He is keeping his blood pressures on daily basis. I'm going to discontinue his Catapres patch and put him on clonidine 0.3 mg by mouth 3 times a day in addition to hydralazine 25 mg by mouth 3 times a day. He'll see Erasmo Downer back in on the 20th of this month for review as of his blood pressure log and medication titration. I agree the Norvasc is probably not the best drug for him because of his lower extremity edema. We will also get renal Doppler studies to rule out renal vascular hypertension.

## 2016-11-19 NOTE — Assessment & Plan Note (Signed)
History of PAF currently on Eliquis oral anticoagulation with a high CHA2DsVASc score

## 2016-11-23 ENCOUNTER — Encounter: Payer: Self-pay | Admitting: Cardiovascular Disease

## 2016-11-24 ENCOUNTER — Ambulatory Visit (HOSPITAL_COMMUNITY)
Admission: RE | Admit: 2016-11-24 | Discharge: 2016-11-24 | Disposition: A | Discharge status: Home / Self Care | Payer: Medicare Other | Source: Ambulatory Visit | Attending: Cardiology | Admitting: Cardiology

## 2016-11-24 DIAGNOSIS — I129 Hypertensive chronic kidney disease with stage 1 through stage 4 chronic kidney disease, or unspecified chronic kidney disease: Secondary | ICD-10-CM | POA: Diagnosis not present

## 2016-11-25 NOTE — Telephone Encounter (Signed)
Pt was seen by Dr. Gwenlyn Found on 11/2

## 2016-12-03 ENCOUNTER — Telehealth: Payer: Self-pay | Admitting: Cardiovascular Disease

## 2016-12-03 NOTE — Telephone Encounter (Signed)
New Message      Pt c/o BP issue: STAT if pt c/o blurred vision, one-sided weakness or slurred speech  1. What are your last 5 BP readings?  In the 200's   2. Are you having any other symptoms (ex. Dizziness, headache, blurred vision, passed out)?  no  3. What is your BP issue? Since he started new medication on Tuesday , his bp has skyrocketed, she will have the bp readings when you call her back

## 2016-12-03 NOTE — Telephone Encounter (Signed)
Returned the call to the patient's daughter. She stated that the patient's blood pressure has not decreased since starting the Hydralazine 25 mg tid. She stated that the patient started the medication on Wednesday evening.  Current medications per the daughter: Hydralazine 25 mg tid Clonidine 0.3 mg tid Lisinopril 20 mg bedtime Atenolol 25 mg bid  Blood pressures have been: 185/67 (11/14) 168/90 (11/15) 187/101 (11/15) 211/89 (11/16) 188/90 (11/16) The patient is asymptomatic.   Per the dod, he should take his blood pressures over the weekend and keep a log of them. The Hydralazine may need more time to take effect. The patient's daughter has been advised to call the on call physician if the blood pressure does not decrease or if he becomes symptomatic. Appointment on 11/20 with pharmd for medication adjustment.

## 2016-12-07 ENCOUNTER — Ambulatory Visit: Payer: Medicare Other

## 2016-12-07 ENCOUNTER — Ambulatory Visit (INDEPENDENT_AMBULATORY_CARE_PROVIDER_SITE_OTHER): Payer: Medicare Other | Admitting: Pharmacist

## 2016-12-07 VITALS — BP 134/68 | HR 56

## 2016-12-07 DIAGNOSIS — I10 Essential (primary) hypertension: Secondary | ICD-10-CM

## 2016-12-07 MED ORDER — HYDRALAZINE HCL 50 MG PO TABS: 50.0000 mg | ORAL_TABLET | Freq: Two times a day (BID) | ORAL | 3 refills | Status: DC

## 2016-12-07 MED ORDER — CARVEDILOL 25 MG PO TABS: 25.0000 mg | ORAL_TABLET | Freq: Two times a day (BID) | ORAL | 3 refills | Status: DC

## 2016-12-07 NOTE — Progress Notes (Signed)
Patient ID: IRL BODIE                 DOB: Jan 17, 1931                      MRN: [id number]     HPI: John Romero is a 83 y.o. male referred by Dr.  to HTN clinic. PMH includes hypertension, atrial fibrillation, DM-II, hypertensive encephalopathy, CKD - stage 3, and chronic lower extremity edema. Denies dizziness, chest pain, dry mouth, or headaches. Reports chronic lower extremities edema but stable. His blood pressure continues to occasionally reach 619-509T systolic.   Current HTN meds:  Atenolol 25mg  twice daily Clonidine 0.3mg  three times daily Furosemide 40mg  twice daily Hydralazine 25mg  three times daily Lisinorpil 20mg  every evening Spironolactone 25mg  daily   BP goal: 140/90  Family History: diabetes and hypertension form mother; heart failure from father;  Social History: denies alcohol use, current daily use (chewing tobacco), only 1 cup of coffee per day,  Diet: not adding salt to food, continue low sodium diet  Exercise: minimal physical activity  Home BP readings: 28 readings; average 166/88 (pulse 45-66 bpm)  Wt Readings from Last 3 Encounters:  11/19/16 200 lb (90.7 kg)  10/19/16 196 lb 6.4 oz (89.1 kg)  10/01/16 201 lb (91.2 kg)   BP Readings from Last 3 Encounters:  12/07/16 134/68  11/19/16 (!) 142/82  11/05/16 (!) 182/84   Pulse Readings from Last 3 Encounters:  12/07/16 (!) 56  11/19/16 64  11/05/16 73    Past Medical History:  Diagnosis Date  . CAD (coronary artery disease)    a. s/p CABG, details unclear.  . CKD (chronic kidney disease), stage III ([employer])   . Edema   . Essential hypertension   . First degree atrioventricular block   . Gout, unspecified   . Gout, unspecified   . Hyperlipidemia   . Hypertensive encephalopathy 10/2014  . Hypertensive heart disease    a. 2D echo 11/01/14: hypokinesis of basal inferior and inferolateral walls, severe focal basal hypertrophy of the septum, EF 55-60%, mild AI, mod LAE.   . Malignant neoplasm of  prostate ([place])   . Old myocardial infarction   . PAF (paroxysmal atrial fibrillation) ([place])    a. Dx 10/2014.   . Stroke (cerebrum) ([place])    a. CT head 10/2014 showed occult strokes: "Old RIGHT basal ganglia lacunar infarct, small bilateral cerebellar infarcts."  . Type II or unspecified type diabetes mellitus without mention of complication, uncontrolled   . Unspecified tinnitus     Current Outpatient Medications on File Prior to Visit  Medication Sig Dispense Refill  . allopurinol (ZYLOPRIM) 300 MG tablet TAKE 1/2 TABLET DAILY TO PREVENT GOUT ATTACK 90 tablet 1  . aspirin EC 81 MG tablet Take 1 tablet (81 mg total) by mouth daily. 90 tablet 3  . cloNIDine (CATAPRES) 0.3 MG tablet     . ELIQUIS 5 MG TABS tablet TAKE 1 TABLET TWICE A DAY 180 tablet 1  . furosemide (LASIX) 40 MG tablet Take 1 tablet by mouth 2 (two) times daily.     . insulin aspart (NOVOLOG) 100 UNIT/ML injection Inject into the skin as directed. 10 UNITS AM, 6 UNITS LUNCH, AND 10 UNITS PM ALL BEFORE MEALS    . Insulin Detemir (LEVEMIR) 100 UNIT/ML Pen Inject into the skin daily. Pt takes 24 units at night time    . lisinopril (PRINIVIL,ZESTRIL) 20 MG tablet Take 1 tablet (20 mg  total) by mouth at bedtime. 90 tablet 3  . Multiple Vitamin (MULTIVITAMIN) tablet Take 1 tablet by mouth daily.    . simvastatin (ZOCOR) 20 MG tablet Take 20 mg by mouth daily.    Marland Kitchen spironolactone (ALDACTONE) 25 MG tablet Take 25 mg by mouth daily.     No current facility-administered medications on file prior to visit.     No Known Allergies  Blood pressure 134/68, pulse (!) 56, SpO2 97 %.  Essential hypertension Blood pressure today is well controlled at 134/68 but patient continues to report occasional BP of 180-200s. Patient denies dizziness, chest pain or headaches related to elevated BP. NO home BP monitor was provided today to calibrate either.  Will discontinue atenolol to carvedilol 25mg  twice daily and change hydralazine to 50mg   twice daily. Patent was instructed to monitor BP twice daily and bring records to next office visit in 3 weeks. All recommendations were also discussed with patient's daughter John Romero) per patient's request.    John Romero PharmD, BCPS, [place] [id number] 12/12/2016 6:46 PM

## 2016-12-07 NOTE — Patient Instructions (Addendum)
Return for a  follow up appointment in 3 weeks  Your blood pressure today is 134/68 pulse 56  Check your blood pressure at home daily (if able) and keep record of the readings.  Take your BP meds as follows: *STOP taking atenolol* *START taking carvedilol 25mg  twice daily* *INCREASE hydralazine to 50mg  twice daily (okay to take 2 tablets of 25mg  twice daily until 50mg  tablets are available)*   Bring your BP cuff and your record of home blood pressures to your next appointment.  Exercise as you're able, try to walk approximately 30 minutes per day.  Keep salt intake to a minimum, especially watch canned and prepared boxed foods.  Eat more fresh fruits and vegetables and fewer canned items.  Avoid eating in fast food restaurants.    HOW TO TAKE YOUR BLOOD PRESSURE: . Rest 5 minutes before taking your blood pressure. .  Don't smoke or drink caffeinated beverages for at least 30 minutes before. . Take your blood pressure before (not after) you eat. . Sit comfortably with your back supported and both feet on the floor (don't cross your legs). . Elevate your arm to heart level on a table or a desk. . Use the proper sized cuff. It should fit smoothly and snugly around your bare upper arm. There should be enough room to slip a fingertip under the cuff. The bottom edge of the cuff should be 1 inch above the crease of the elbow. . Ideally, take 3 measurements at one sitting and record the average.

## 2016-12-12 ENCOUNTER — Encounter: Payer: Self-pay | Admitting: Pharmacist

## 2016-12-12 NOTE — Assessment & Plan Note (Addendum)
Blood pressure today is well controlled at 134/68 but patient continues to report occasional BP of 180-200s. Patient denies dizziness, chest pain or headaches related to elevated BP. NO home BP monitor was provided today to calibrate either.  Will discontinue atenolol to carvedilol 25mg  twice daily and change hydralazine to 50mg  twice daily. Patent was instructed to monitor BP twice daily and bring records to next office visit in 3 weeks. All recommendations were also discussed with patient's daughter [name]) per patient's request.

## 2016-12-15 ENCOUNTER — Telehealth: Payer: Self-pay | Admitting: Surgery

## 2016-12-15 NOTE — Telephone Encounter (Signed)
Pt of Dr. Gwenlyn Found.   HTN PAF CHF IDDM  Recently seen by [name], & has been followed by pharmD for BP & med management.   Currently on spironolactone, lasix, lisinopril, catapres, coreg  Spoke to daughter.   She reports pt had a busy day yesterday, has been remodeling a bathroom, and was working all day. He had eaten breakfast and lunch, but when she went to check in on him at 9:30pm he'd not eaten dinner or taken his dinner time medications. She states he took BP reading, 756 systolic, took meds.  ~11pm pt felt weak and dizzy, had taken his BP which was 70/49. He didn't call her when this happened, but did report it to her this morning when she called him.  She reports pt feels fine today. No complaints. No symptoms. Generally his systolic BP runs in the 433-295 range, she does note he continues to have some spikes in the 170s or 180s.  For now, I recommended she call if he has any repeat low BP events, but make sure he is remembering to drink fluids through the day (not to exceed recommended restrictions) and eat regularly. Advised that he should monitor his blood sugar closely as well & follow prescriber's recommendations for insulin.  Recommended to continue current meds, call if repeat concerns, and follow up on Dec 6th as scheduled.  Will route to [facility] (Dr. Percival Spanish) for any further recomendations.

## 2016-12-15 NOTE — Telephone Encounter (Signed)
Agree 

## 2016-12-15 NOTE — Telephone Encounter (Signed)
New message    Patient daughter calling to report last night patient was confused and dizzy last night. States he is feeling better today.  Please call daughter at [phone].    Pt c/o BP issue: STAT if pt c/o blurred vision, one-sided weakness or slurred speech  1. What are your last 5 BP readings? 150/?, this morning and 70/49 last night  2. Are you having any other symptoms (ex. Dizziness, headache, blurred vision, passed out)? Dizzy, weak ,confused  3. What is your BP issue? blood pressure was low last night, did not eat and take medication until about 9:30 last night, by 11:30 he was feeling bad.

## 2016-12-16 DIAGNOSIS — Z23 Encounter for immunization: Secondary | ICD-10-CM | POA: Diagnosis not present

## 2016-12-20 ENCOUNTER — Ambulatory Visit: Payer: Medicare Other | Admitting: Cardiology

## 2016-12-20 ENCOUNTER — Telehealth: Payer: Self-pay | Admitting: Cardiovascular Disease

## 2016-12-20 ENCOUNTER — Ambulatory Visit: Payer: Medicare Other

## 2016-12-20 NOTE — Telephone Encounter (Signed)
I agree that symptom description is most consistent with hypotension or hypoglycemia. MCr

## 2016-12-20 NOTE — Telephone Encounter (Signed)
Spoke to daughter.  Daughter notes Saturday night he was "sweating", confused, lasted an hour went away. BP was Ok when checked (110/70)  Of note, she called last week reporting similar occurrence over weekend, r/t pt overexerting himself and perhaps being dehydrated or having low BS. BP was low at that time (70/49)    Pt took VS this AM - 110/66 HR 70  Pt had concerns on whether he should take his lunchtime medication (clonidine 0.3mg  which he takes 3 times daily). Per daughter's understanding, this medication is not PRN and not given for BP above a predetermined value - he takes it regularly.    I advised to go ahead and take BP at lunch time, if systolic below 681 or pt having weakness or dizziness, hold med and call - otherwise take meds as dosed.

## 2016-12-20 NOTE — Telephone Encounter (Signed)
New message   Pt c/o BP issue: STAT if pt c/o blurred vision, one-sided weakness or slurred speech  1. What are your last 5 BP readings? 110/66  Hr 70 2. Are you having any other symptoms (ex. Dizziness, headache, blurred vision, passed out)? 12/1 sweating dizzy lathric   3. What is your BP issue? Low   Do pt need to take another dose at lunch?

## 2016-12-21 ENCOUNTER — Telehealth: Payer: Self-pay | Admitting: Cardiology

## 2016-12-21 NOTE — Telephone Encounter (Signed)
Returned call to daughter. Explained that 12/14 appt was cancelled b/c patient saw Dr. Gwenlyn Found Nov 2 and was advised to come back in 3 months and see [name], [place] and 6 months with Dr. Gwenlyn Found. Scheduled PAOV 02/21/17

## 2016-12-21 NOTE — Telephone Encounter (Signed)
John Romero wants to know why pt appointment was cancelled for 12-31-16? Does he need another appt?

## 2016-12-23 ENCOUNTER — Ambulatory Visit: Payer: Medicare Other

## 2016-12-31 ENCOUNTER — Ambulatory Visit: Payer: Medicare Other | Admitting: Cardiology

## 2017-01-04 ENCOUNTER — Ambulatory Visit (INDEPENDENT_AMBULATORY_CARE_PROVIDER_SITE_OTHER): Payer: Medicare Other | Admitting: Pharmacist Clinician (PhC)/ Clinical Pharmacy Specialist

## 2017-01-04 DIAGNOSIS — I10 Essential (primary) hypertension: Secondary | ICD-10-CM | POA: Diagnosis not present

## 2017-01-04 MED ORDER — CLONIDINE HCL 0.3 MG PO TABS: 0.3000 mg | ORAL_TABLET | Freq: Three times a day (TID) | ORAL | 2 refills | Status: DC

## 2017-01-04 NOTE — Progress Notes (Signed)
Patient ID: John Romero                 DOB: 05/30/1930                      MRN: [id number]     HPI: John Romero is a 83 y.o. male referred by Dr. Gwenlyn Found to HTN clinic. PMH includes hypertension, atrial fibrillation, DM-II, hypertensive encephalopathy, CKD - stage 3, and chronic lower extremity edema. Denies dizziness, chest pain, dry mouth, or headaches. Reports chronic lower extremities edema but stable.   Since his last visit his home blood pressures have improved greatly, and although he does have occasional elevated afternoon and evening numbers, his morning average is running at 466 systolic.  He has no complaints about his current medications, states no problems with compliance and is here today with his daughter.    Current HTN meds:  Carvedilol 25mg  twice daily Clonidine 0.3mg  three times daily Furosemide 40mg  twice daily Hydralazine 50 mg twice daily Lisinorpil 20mg  every evening Spironolactone 25mg  daily   BP goal: 140/90  Family History: diabetes and hypertension form mother; heart failure from father;  Social History: denies alcohol use, current daily use (chewing tobacco), only 1 cup of coffee per day, diet soda  Diet: not adding salt to food, continue low sodium diet  Exercise: minimal physical activity, patient in wheelchair  Home BP readings: 22 readings; average 130/77 with a range of 104-153/62-84  CrCl 54.3  Wt Readings from Last 3 Encounters:  11/19/16 200 lb (90.7 kg)  10/19/16 196 lb 6.4 oz (89.1 kg)  10/01/16 201 lb (91.2 kg)   BP Readings from Last 3 Encounters:  01/04/17 122/62  12/07/16 134/68  11/19/16 (!) 142/82   Pulse Readings from Last 3 Encounters:  01/04/17 80  12/07/16 (!) 56  11/19/16 64    Past Medical History:  Diagnosis Date  . CAD (coronary artery disease)    a. s/p CABG, details unclear.  . CKD (chronic kidney disease), stage III ([place])   . Edema   . Essential hypertension   . First degree atrioventricular block   . Gout,  unspecified   . Gout, unspecified   . Hyperlipidemia   . Hypertensive encephalopathy 10/2014  . Hypertensive heart disease    a. 2D echo 11/01/14: hypokinesis of basal inferior and inferolateral walls, severe focal basal hypertrophy of the septum, EF 55-60%, mild AI, mod LAE.   . Malignant neoplasm of prostate ([place])   . Old myocardial infarction   . PAF (paroxysmal atrial fibrillation) ([place])    a. Dx 10/2014.   . Stroke (cerebrum) ([place])    a. CT head 10/2014 showed occult strokes: "Old RIGHT basal ganglia lacunar infarct, small bilateral cerebellar infarcts."  . Type II or unspecified type diabetes mellitus without mention of complication, uncontrolled   . Unspecified tinnitus     Current Outpatient Medications on File Prior to Visit  Medication Sig Dispense Refill  . allopurinol (ZYLOPRIM) 300 MG tablet TAKE 1/2 TABLET DAILY TO PREVENT GOUT ATTACK 90 tablet 1  . aspirin EC 81 MG tablet Take 1 tablet (81 mg total) by mouth daily. 90 tablet 3  . carvedilol (COREG) 25 MG tablet Take 1 tablet (25 mg total) by mouth 2 (two) times daily with a meal. To replace atenolol 25mg  180 tablet 3  . ELIQUIS 5 MG TABS tablet TAKE 1 TABLET TWICE A DAY 180 tablet 1  . furosemide (LASIX) 40 MG tablet Take 1  tablet by mouth 2 (two) times daily.     . hydrALAZINE (APRESOLINE) 50 MG tablet Take 1 tablet (50 mg total) by mouth 2 (two) times daily. 180 tablet 3  . insulin aspart (NOVOLOG) 100 UNIT/ML injection Inject into the skin as directed. 10 UNITS AM, 6 UNITS LUNCH, AND 10 UNITS PM ALL BEFORE MEALS    . Insulin Detemir (LEVEMIR) 100 UNIT/ML Pen Inject into the skin daily. Pt takes 24 units at night time    . lisinopril (PRINIVIL,ZESTRIL) 20 MG tablet Take 1 tablet (20 mg total) by mouth at bedtime. 90 tablet 3  . Multiple Vitamin (MULTIVITAMIN) tablet Take 1 tablet by mouth daily.    . simvastatin (ZOCOR) 20 MG tablet Take 20 mg by mouth daily.    Marland Kitchen spironolactone (ALDACTONE) 25 MG tablet Take 25 mg by  mouth daily.     No current facility-administered medications on file prior to visit.     No Known Allergies  Blood pressure 122/62, pulse 80.  Essential hypertension Patient with essential hypertension, now mostly controlled with current 5 medication regimen (not including furosemide).   While he does still have the occasional evening reading as high as 030 systolic, he also has had some as low as 104.  With his advanced age, I will not make any changes to his current regimen.   He will continue to check home blood pressures, although he was encouraged to not check more than twice daily.  He and his daughter were encouraged to call in the future with any concerns.   [place] [facility] [address] [place] [address] 01/05/2017 4:47 PM

## 2017-01-04 NOTE — Patient Instructions (Signed)
Call if you notice a rise in blood pressure to consistently > 509 systolic  Your blood pressure today is 122/62  Check your blood pressure at home about 3 times per week, no more than twice daily and keep record of the readings.  Take your BP meds as follows:  Continue with all current medications  Bring all of your meds, your BP cuff and your record of home blood pressures to your next appointment.  Exercise as you're able, try to walk approximately 30 minutes per day.  Keep salt intake to a minimum, especially watch canned and prepared boxed foods.  Eat more fresh fruits and vegetables and fewer canned items.  Avoid eating in fast food restaurants.    HOW TO TAKE YOUR BLOOD PRESSURE: . Rest 5 minutes before taking your blood pressure. .  Don't smoke or drink caffeinated beverages for at least 30 minutes before. . Take your blood pressure before (not after) you eat. . Sit comfortably with your back supported and both feet on the floor (don't cross your legs). . Elevate your arm to heart level on a table or a desk. . Use the proper sized cuff. It should fit smoothly and snugly around your bare upper arm. There should be enough room to slip a fingertip under the cuff. The bottom edge of the cuff should be 1 inch above the crease of the elbow. . Ideally, take 3 measurements at one sitting and record the average.

## 2017-01-05 NOTE — Assessment & Plan Note (Signed)
Patient with essential hypertension, now mostly controlled with current 5 medication regimen (not including furosemide).   While he does still have the occasional evening reading as high as 021 systolic, he also has had some as low as 104.  With his advanced age, I will not make any changes to his current regimen.   He will continue to check home blood pressures, although he was encouraged to not check more than twice daily.  He and his daughter were encouraged to call in the future with any concerns.

## 2017-01-06 ENCOUNTER — Encounter: Payer: Self-pay | Admitting: Pharmacist Clinician (PhC)/ Clinical Pharmacy Specialist

## 2017-01-24 ENCOUNTER — Ambulatory Visit (INDEPENDENT_AMBULATORY_CARE_PROVIDER_SITE_OTHER): Payer: Medicare Other | Admitting: Podiatry

## 2017-01-24 ENCOUNTER — Encounter: Payer: Self-pay | Admitting: Podiatry

## 2017-01-24 DIAGNOSIS — M79675 Pain in left toe(s): Secondary | ICD-10-CM | POA: Diagnosis not present

## 2017-01-24 DIAGNOSIS — M79674 Pain in right toe(s): Secondary | ICD-10-CM

## 2017-01-24 DIAGNOSIS — B351 Tinea unguium: Secondary | ICD-10-CM | POA: Diagnosis not present

## 2017-01-26 NOTE — Progress Notes (Signed)
Subjective:   Patient ID: John Romero, male   DOB: 84 y.o.   MRN: [id number]   HPI Patient presents with thick nailbeds 1-5 both feet that are dystrophic and hard to cut   ROS      Objective:  Physical Exam  Mycotic nail infection with pain 1-5 both feet     Assessment:  Debride painful nailbeds 1-5 both feet with no iatrogenic bleeding noted     Plan:  Mycotic nail infection with debridement which was accomplished today with no iatrogenic bleeding

## 2017-02-16 ENCOUNTER — Other Ambulatory Visit: Payer: Self-pay | Admitting: Cardiovascular Disease

## 2017-02-23 ENCOUNTER — Encounter: Payer: Self-pay | Admitting: Cardiology

## 2017-02-23 ENCOUNTER — Ambulatory Visit (INDEPENDENT_AMBULATORY_CARE_PROVIDER_SITE_OTHER): Payer: Medicare Other | Admitting: Cardiology

## 2017-02-23 VITALS — BP 125/74 | HR 63 | Ht 71.0 in | Wt 203.0 lb

## 2017-02-23 DIAGNOSIS — I10 Essential (primary) hypertension: Secondary | ICD-10-CM | POA: Diagnosis not present

## 2017-02-23 DIAGNOSIS — I5043 Acute on chronic combined systolic (congestive) and diastolic (congestive) heart failure: Secondary | ICD-10-CM

## 2017-02-23 NOTE — Progress Notes (Signed)
02/23/2017 John Romero   01/16/1931  [id number]  Primary Physician [name], MD Primary Cardiologist: Dr Gwenlyn Found  HPI:  84 y/o male with CAD, s/p remote CABG in 1995, details unclear. He was seen by Dr Gwenlyn Found initially as a consult in 2016. He had a Myoview in Jan 2017 that showed a small area of inferolateral scar with mild peri-infarct ischemia and an ejection fraction of 47%. Other problems include HLD,type 2 IDDM,CRI stage III, CAF on Eliquis, and labile HTN. He has had several office visits over the past 6 months for labile B/P. He has finally come under acceptable control. Renal artery dopplers in Nov were normal. He is in the office today for a 3 month follow up. As always he is a man of few words. Overall he says he is dong well. He denies chest pain or SOB. He has chronic LE edema, no change. His daughter usually accompanies him but she opted to stay in the lobby today.    Current Outpatient Medications  Medication Sig Dispense Refill  . allopurinol (ZYLOPRIM) 300 MG tablet TAKE 1/2 TABLET DAILY TO PREVENT GOUT ATTACK 90 tablet 1  . aspirin EC 81 MG tablet Take 1 tablet (81 mg total) by mouth daily. 90 tablet 3  . carvedilol (COREG) 25 MG tablet Take 1 tablet (25 mg total) by mouth 2 (two) times daily with a meal. To replace atenolol 25mg  180 tablet 3  . cloNIDine (CATAPRES) 0.3 MG tablet Take 1 tablet (0.3 mg total) by mouth 3 (three) times daily. 270 tablet 2  . ELIQUIS 5 MG TABS tablet TAKE 1 TABLET TWICE A DAY 180 tablet 1  . furosemide (LASIX) 40 MG tablet Take 1 tablet by mouth 2 (two) times daily.     . hydrALAZINE (APRESOLINE) 50 MG tablet Take 1 tablet (50 mg total) by mouth 2 (two) times daily. 180 tablet 3  . insulin aspart (NOVOLOG) 100 UNIT/ML injection Inject into the skin as directed. 10 UNITS AM, 6 UNITS LUNCH, AND 10 UNITS PM ALL BEFORE MEALS    . Insulin Detemir (LEVEMIR) 100 UNIT/ML Pen Inject into the skin daily. Pt takes 24 units at night time    .  lisinopril (PRINIVIL,ZESTRIL) 20 MG tablet Take 1 tablet (20 mg total) by mouth at bedtime. 90 tablet 3  . Multiple Vitamin (MULTIVITAMIN) tablet Take 1 tablet by mouth daily.    . simvastatin (ZOCOR) 20 MG tablet Take 20 mg by mouth daily.    Marland Kitchen spironolactone (ALDACTONE) 25 MG tablet Take 25 mg by mouth daily.     No current facility-administered medications for this visit.     No Known Allergies  Past Medical History:  Diagnosis Date  . CAD (coronary artery disease)    a. s/p CABG, details unclear.  . CKD (chronic kidney disease), stage III ([place])   . Edema   . Essential hypertension   . First degree atrioventricular block   . Gout, unspecified   . Gout, unspecified   . Hyperlipidemia   . Hypertensive encephalopathy 10/2014  . Hypertensive heart disease    a. 2D echo 11/01/14: hypokinesis of basal inferior and inferolateral walls, severe focal basal hypertrophy of the septum, EF 55-60%, mild AI, mod LAE.   . Malignant neoplasm of prostate ([place])   . Old myocardial infarction   . PAF (paroxysmal atrial fibrillation) ([place])    a. Dx 10/2014.   . Stroke (cerebrum) ([place])    a. CT head 10/2014 showed occult strokes: "  Old RIGHT basal ganglia lacunar infarct, small bilateral cerebellar infarcts."  . Type II or unspecified type diabetes mellitus without mention of complication, uncontrolled   . Unspecified tinnitus     Social History   Socioeconomic History  . Marital status: Married    Spouse name: Not on file  . Number of children: Not on file  . Years of education: Not on file  . Highest education level: Not on file  Social Needs  . Financial resource strain: Not on file  . Food insecurity - worry: Not on file  . Food insecurity - inability: Not on file  . Transportation needs - medical: Not on file  . Transportation needs - non-medical: Not on file  Occupational History  . Not on file  Tobacco Use  . Smoking status: Never Smoker  . Smokeless tobacco: Current User     Types: Chew  Substance and Sexual Activity  . Alcohol use: No  . Drug use: No  . Sexual activity: Not on file    Comment: unknown  Other Topics Concern  . Not on file  Social History Narrative  . Not on file     Family History  Problem Relation Age of Onset  . Diabetes Mother   . Hypertension Mother   . Heart disease Father   . Cancer Sister        lung  . Cancer Sister        lung  . Heart attack Neg Hx   . Stroke Neg Hx      Review of Systems: General: negative for chills, fever, night sweats or weight changes.  Cardiovascular: negative for chest pain, dyspnea on exertion, edema, orthopnea, palpitations, paroxysmal nocturnal dyspnea or shortness of breath Dermatological: negative for rash Respiratory: negative for cough or wheezing Urologic: negative for hematuria Abdominal: negative for nausea, vomiting, diarrhea, bright red blood per rectum, melena, or hematemesis Neurologic: negative for visual changes, syncope, or dizziness All other systems reviewed and are otherwise negative except as noted above.    Blood pressure 125/74, pulse 63, height 5\' 11"  (1.803 m), weight 203 lb (92.1 kg).  General appearance: alert, cooperative and no distress Neck: no carotid bruit and no JVD Lungs: basilar crackles Heart: irregularly irregular rhythm Extremities: 1+ LE edema Skin: Skin color, texture, turgor normal. No rashes or lesions Neurologic: Grossly normal   ASSESSMENT AND PLAN:   Essential hypertension The pt has had labile B/P, currently controlled  Atrial fibrillation ([place]) This appears to be permanent.   Chronic combined systolic and diastolic CHF (congestive heart failure) (HCC) No CHF on exam. His basilar crackles sound more like scarring. No other signs or symptoms of CHF  Chronic anticoagulation Eliquis-CHADs VASc= 8 for age, CHF, HTN, DM, H/O CVA, and vascular disease  Hypertensive cardiovascular disease EF 45-50%, mild LVH- echo Dec 2016  Hx of  CABG CABG '95, Myoview low risk Jan 2017, no chest pain  Chronic kidney disease, stage 3 Last SCr 1.3  DM (diabetes mellitus), type 2 with renal complications (HCC) IDDM followed by PCP  Lower leg edema Chronic   PLAN  Check BMP today, f/u Dr Gwenlyn Found in 3 months.   [name] PA-C 02/23/2017 4:10 PM

## 2017-02-23 NOTE — Patient Instructions (Signed)
Lab work today ( bmet )    Your physician recommends that you schedule a follow-up appointment in 3 to 4 months with Dr.Berry

## 2017-02-24 ENCOUNTER — Other Ambulatory Visit: Payer: Self-pay | Admitting: Cardiology

## 2017-02-24 DIAGNOSIS — N289 Disorder of kidney and ureter, unspecified: Secondary | ICD-10-CM

## 2017-02-24 LAB — BASIC METABOLIC PANEL
BUN/Creatinine Ratio: 20 (ref 10–24)
BUN: 36 mg/dL — ABNORMAL HIGH (ref 8–27)
CO2: 26 mmol/L (ref 20–29)
Calcium: 9.2 mg/dL (ref 8.6–10.2)
Chloride: 96 mmol/L (ref 96–106)
Creatinine, Ser: 1.8 mg/dL — ABNORMAL HIGH (ref 0.76–1.27)
GFR calc Af Amer: 39 mL/min/{1.73_m2} — ABNORMAL LOW (ref >59)
GFR calc non Af Amer: 34 mL/min/{1.73_m2} — ABNORMAL LOW (ref >59)
Glucose: 237 mg/dL — ABNORMAL HIGH (ref 65–99)
Potassium: 4.6 mmol/L (ref 3.5–5.2)
Sodium: 138 mmol/L (ref 134–144)

## 2017-02-25 ENCOUNTER — Other Ambulatory Visit: Payer: Self-pay

## 2017-02-25 DIAGNOSIS — I5043 Acute on chronic combined systolic (congestive) and diastolic (congestive) heart failure: Secondary | ICD-10-CM

## 2017-02-25 MED ORDER — FUROSEMIDE 40 MG PO TABS: ORAL_TABLET | ORAL | 6 refills | Status: AC

## 2017-03-11 ENCOUNTER — Other Ambulatory Visit: Payer: Self-pay | Admitting: *Deleted

## 2017-03-11 DIAGNOSIS — N289 Disorder of kidney and ureter, unspecified: Secondary | ICD-10-CM

## 2017-03-12 LAB — BASIC METABOLIC PANEL
BUN/Creatinine Ratio: 21 (ref 10–24)
BUN: 32 mg/dL — ABNORMAL HIGH (ref 8–27)
CO2: 22 mmol/L (ref 20–29)
Calcium: 9 mg/dL (ref 8.6–10.2)
Chloride: 101 mmol/L (ref 96–106)
Creatinine, Ser: 1.52 mg/dL — ABNORMAL HIGH (ref 0.76–1.27)
GFR calc Af Amer: 48 mL/min/{1.73_m2} — ABNORMAL LOW (ref >59)
GFR calc non Af Amer: 41 mL/min/{1.73_m2} — ABNORMAL LOW (ref >59)
Glucose: 223 mg/dL — ABNORMAL HIGH (ref 65–99)
Potassium: 4.1 mmol/L (ref 3.5–5.2)
Sodium: 139 mmol/L (ref 134–144)

## 2017-03-18 DIAGNOSIS — E113293 Type 2 diabetes mellitus with mild nonproliferative diabetic retinopathy without macular edema, bilateral: Secondary | ICD-10-CM | POA: Diagnosis not present

## 2017-03-18 DIAGNOSIS — H52203 Unspecified astigmatism, bilateral: Secondary | ICD-10-CM | POA: Diagnosis not present

## 2017-03-18 DIAGNOSIS — H01005 Unspecified blepharitis left lower eyelid: Secondary | ICD-10-CM | POA: Diagnosis not present

## 2017-03-18 DIAGNOSIS — H01002 Unspecified blepharitis right lower eyelid: Secondary | ICD-10-CM | POA: Diagnosis not present

## 2017-04-03 ENCOUNTER — Other Ambulatory Visit: Payer: Self-pay | Admitting: Cardiovascular Disease

## 2017-04-04 NOTE — Telephone Encounter (Signed)
REFILL 

## 2017-04-06 ENCOUNTER — Other Ambulatory Visit: Payer: Self-pay | Admitting: Geriatric Medicine

## 2017-04-06 ENCOUNTER — Ambulatory Visit
Admission: RE | Admit: 2017-04-06 | Discharge: 2017-04-06 | Disposition: A | Discharge status: Home / Self Care | Payer: Medicare Other | Source: Ambulatory Visit | Attending: Geriatric Medicine | Admitting: Geriatric Medicine

## 2017-04-06 DIAGNOSIS — Z1389 Encounter for screening for other disorder: Secondary | ICD-10-CM | POA: Diagnosis not present

## 2017-04-06 DIAGNOSIS — R05 Cough: Secondary | ICD-10-CM | POA: Diagnosis not present

## 2017-04-06 DIAGNOSIS — I4891 Unspecified atrial fibrillation: Secondary | ICD-10-CM | POA: Diagnosis not present

## 2017-04-06 DIAGNOSIS — I129 Hypertensive chronic kidney disease with stage 1 through stage 4 chronic kidney disease, or unspecified chronic kidney disease: Secondary | ICD-10-CM | POA: Diagnosis not present

## 2017-04-06 DIAGNOSIS — R059 Cough, unspecified: Secondary | ICD-10-CM

## 2017-04-06 DIAGNOSIS — Z79899 Other long term (current) drug therapy: Secondary | ICD-10-CM | POA: Diagnosis not present

## 2017-04-06 DIAGNOSIS — Z0000 Encounter for general adult medical examination without abnormal findings: Secondary | ICD-10-CM | POA: Diagnosis not present

## 2017-04-25 ENCOUNTER — Ambulatory Visit: Payer: Medicare Other | Admitting: Podiatry

## 2017-04-28 ENCOUNTER — Encounter: Payer: Self-pay | Admitting: Podiatry

## 2017-04-28 ENCOUNTER — Ambulatory Visit (INDEPENDENT_AMBULATORY_CARE_PROVIDER_SITE_OTHER): Payer: Medicare Other | Admitting: Podiatry

## 2017-04-28 DIAGNOSIS — D689 Coagulation defect, unspecified: Secondary | ICD-10-CM

## 2017-04-28 DIAGNOSIS — L84 Corns and callosities: Secondary | ICD-10-CM | POA: Diagnosis not present

## 2017-04-28 DIAGNOSIS — M79675 Pain in left toe(s): Secondary | ICD-10-CM

## 2017-04-28 DIAGNOSIS — M79674 Pain in right toe(s): Secondary | ICD-10-CM

## 2017-04-28 DIAGNOSIS — B351 Tinea unguium: Secondary | ICD-10-CM

## 2017-04-28 NOTE — Progress Notes (Signed)
Subjective:   Patient ID: John Romero, male   DOB: 84 y.o.   MRN: [id number]   HPI Patient presents with chronic nail disease 1-5 both feet with incurvation of the beds and pain with palpation   ROS      Objective:  Physical Exam  Neurovascular status intact with thick yellow brittle nailbeds 1-5 both feet that are painful     Assessment:  Mycotic infection nailbeds 1-5 both feet with pain     Plan:  Debride painful nailbeds 1-5 both feet with no iatrogenic bleeding noted

## 2017-05-03 ENCOUNTER — Ambulatory Visit
Admission: RE | Admit: 2017-05-03 | Discharge: 2017-05-03 | Disposition: A | Discharge status: Home / Self Care | Payer: Medicare Other | Source: Ambulatory Visit | Attending: Geriatric Medicine | Admitting: Geriatric Medicine

## 2017-05-03 ENCOUNTER — Other Ambulatory Visit: Payer: Self-pay | Admitting: Geriatric Medicine

## 2017-05-03 DIAGNOSIS — J181 Lobar pneumonia, unspecified organism: Principal | ICD-10-CM

## 2017-05-03 DIAGNOSIS — I480 Paroxysmal atrial fibrillation: Secondary | ICD-10-CM | POA: Diagnosis not present

## 2017-05-03 DIAGNOSIS — J189 Pneumonia, unspecified organism: Secondary | ICD-10-CM

## 2017-05-03 DIAGNOSIS — R05 Cough: Secondary | ICD-10-CM | POA: Diagnosis not present

## 2017-05-03 DIAGNOSIS — N183 Chronic kidney disease, stage 3 (moderate): Secondary | ICD-10-CM | POA: Diagnosis not present

## 2017-05-03 DIAGNOSIS — I129 Hypertensive chronic kidney disease with stage 1 through stage 4 chronic kidney disease, or unspecified chronic kidney disease: Secondary | ICD-10-CM | POA: Diagnosis not present

## 2017-05-03 DIAGNOSIS — R042 Hemoptysis: Secondary | ICD-10-CM | POA: Diagnosis not present

## 2017-05-13 ENCOUNTER — Other Ambulatory Visit: Payer: Self-pay

## 2017-05-13 ENCOUNTER — Emergency Department (HOSPITAL_COMMUNITY)
Admission: EM | Admit: 2017-05-13 | Discharge: 2017-05-13 | Disposition: A | Discharge status: Home / Self Care | Payer: Medicare Other | Attending: Emergency Medicine | Admitting: Emergency Medicine

## 2017-05-13 ENCOUNTER — Encounter (HOSPITAL_COMMUNITY): Payer: Self-pay

## 2017-05-13 DIAGNOSIS — Z951 Presence of aortocoronary bypass graft: Secondary | ICD-10-CM | POA: Insufficient documentation

## 2017-05-13 DIAGNOSIS — Z8673 Personal history of transient ischemic attack (TIA), and cerebral infarction without residual deficits: Secondary | ICD-10-CM | POA: Diagnosis not present

## 2017-05-13 DIAGNOSIS — Z79899 Other long term (current) drug therapy: Secondary | ICD-10-CM | POA: Insufficient documentation

## 2017-05-13 DIAGNOSIS — I130 Hypertensive heart and chronic kidney disease with heart failure and stage 1 through stage 4 chronic kidney disease, or unspecified chronic kidney disease: Secondary | ICD-10-CM | POA: Insufficient documentation

## 2017-05-13 DIAGNOSIS — Z7982 Long term (current) use of aspirin: Secondary | ICD-10-CM | POA: Insufficient documentation

## 2017-05-13 DIAGNOSIS — I959 Hypotension, unspecified: Secondary | ICD-10-CM | POA: Diagnosis not present

## 2017-05-13 DIAGNOSIS — Z794 Long term (current) use of insulin: Secondary | ICD-10-CM | POA: Insufficient documentation

## 2017-05-13 DIAGNOSIS — N183 Chronic kidney disease, stage 3 (moderate): Secondary | ICD-10-CM | POA: Diagnosis not present

## 2017-05-13 DIAGNOSIS — F17220 Nicotine dependence, chewing tobacco, uncomplicated: Secondary | ICD-10-CM | POA: Diagnosis not present

## 2017-05-13 DIAGNOSIS — I2510 Atherosclerotic heart disease of native coronary artery without angina pectoris: Secondary | ICD-10-CM | POA: Diagnosis not present

## 2017-05-13 DIAGNOSIS — E1122 Type 2 diabetes mellitus with diabetic chronic kidney disease: Secondary | ICD-10-CM | POA: Diagnosis not present

## 2017-05-13 DIAGNOSIS — I5042 Chronic combined systolic (congestive) and diastolic (congestive) heart failure: Secondary | ICD-10-CM | POA: Insufficient documentation

## 2017-05-13 DIAGNOSIS — Z7901 Long term (current) use of anticoagulants: Secondary | ICD-10-CM | POA: Diagnosis not present

## 2017-05-13 LAB — I-STAT CG4 LACTIC ACID, ED: Lactic Acid, Venous: 1.46 mmol/L (ref 0.5–1.9)

## 2017-05-13 LAB — CBC WITH DIFFERENTIAL/PLATELET
BASOS ABS: 0.1 10*3/uL (ref 0.0–0.1)
Basophils Relative: 1 %
Eosinophils Absolute: 0.3 10*3/uL (ref 0.0–0.7)
Eosinophils Relative: 3 %
HCT: 38.4 % — ABNORMAL LOW (ref 39.0–52.0)
HEMOGLOBIN: 12.7 g/dL — AB (ref 13.0–17.0)
LYMPHS ABS: 1.7 10*3/uL (ref 0.7–4.0)
LYMPHS PCT: 21 %
MCH: 31.3 pg (ref 26.0–34.0)
MCHC: 33.1 g/dL (ref 30.0–36.0)
MCV: 94.6 fL (ref 78.0–100.0)
Monocytes Absolute: 0.5 10*3/uL (ref 0.1–1.0)
Monocytes Relative: 6 %
NEUTROS PCT: 69 %
Neutro Abs: 5.6 10*3/uL (ref 1.7–7.7)
PLATELETS: 212 10*3/uL (ref 150–400)
RBC: 4.06 MIL/uL — AB (ref 4.22–5.81)
RDW: 13.3 % (ref 11.5–15.5)
WBC: 8.1 10*3/uL (ref 4.0–10.5)

## 2017-05-13 LAB — COMPREHENSIVE METABOLIC PANEL
ALT: 25 U/L (ref 17–63)
ANION GAP: 13 (ref 5–15)
AST: 37 U/L (ref 15–41)
Albumin: 3.6 g/dL (ref 3.5–5.0)
Alkaline Phosphatase: 68 U/L (ref 38–126)
BILIRUBIN TOTAL: 0.7 mg/dL (ref 0.3–1.2)
BUN: 37 mg/dL — AB (ref 6–20)
CHLORIDE: 100 mmol/L — AB (ref 101–111)
CO2: 24 mmol/L (ref 22–32)
Calcium: 9.5 mg/dL (ref 8.9–10.3)
Creatinine, Ser: 1.73 mg/dL — ABNORMAL HIGH (ref 0.61–1.24)
GFR calc non Af Amer: 34 mL/min — ABNORMAL LOW (ref >60)
GFR, EST AFRICAN AMERICAN: 40 mL/min — AB (ref >60)
Glucose, Bld: 152 mg/dL — ABNORMAL HIGH (ref 65–99)
POTASSIUM: 4.2 mmol/L (ref 3.5–5.1)
Sodium: 137 mmol/L (ref 135–145)
TOTAL PROTEIN: 6.3 g/dL — AB (ref 6.5–8.1)

## 2017-05-13 NOTE — ED Notes (Signed)
I spoke with daughter who is concerned that patient may not need to be in ED. I informed her that her father was sent by his MD after low BP noted in office this am. I informed the patient and family that they were sent to R/O sepsis and that labs would help to r/o that problem.

## 2017-05-13 NOTE — ED Notes (Signed)
Pt and family stated that they wanted to wait on the CXR and see what the blood work says first. Pt is refusing CXR at this time, triage RN notified.

## 2017-05-13 NOTE — ED Notes (Signed)
ED Provider at bedside. 

## 2017-05-13 NOTE — Discharge Instructions (Addendum)
Labs today without any significant findings.  Blood pressures here have trended fine with systolics in the 588T to 254D.  Possible that his clonidine may have played a role in the low blood pressure.  No evidence of a serious infection here today or sepsis.  Patient stable for discharge home and close follow-up with primary care doctor.

## 2017-05-13 NOTE — ED Triage Notes (Signed)
Patient sent from doctors office for hypotension and congestion with weakness x 1 week. Denies pain, alert and oriented. Systolic BP 95V at office

## 2017-05-23 NOTE — ED Provider Notes (Signed)
[facility] EMERGENCY DEPARTMENT Provider Note   CSN: [id number] Arrival date & time: 05/13/17  7829     History   Chief Complaint No chief complaint on file.   HPI John Romero is a 84 y.o. male.  Patient sent in from primary care office for blood pressures in the 60s.  Patient went there for just congestion with weakness for a week.  Denied any pain no chest pain.  Upon arrival here blood pressure was 98/62.  Patient was brought in by family members.  Patient is stated that is refusing chest x-ray.  Did agree to labs.  Patient without any complaints at all.  Perplexing why the blood pressure was so low in the office.  Apparently was repeated 3 times.     Past Medical History:  Diagnosis Date  . CAD (coronary artery disease)    a. s/p CABG, details unclear.  . CKD (chronic kidney disease), stage III ([name])   . Edema   . Essential hypertension   . First degree atrioventricular block   . Gout, unspecified   . Gout, unspecified   . Hyperlipidemia   . Hypertensive encephalopathy 10/2014  . Hypertensive heart disease    a. 2D echo 11/01/14: hypokinesis of basal inferior and inferolateral walls, severe focal basal hypertrophy of the septum, EF 55-60%, mild AI, mod LAE.   . Malignant neoplasm of prostate ([place])   . Old myocardial infarction   . PAF (paroxysmal atrial fibrillation) ([place])    a. Dx 10/2014.   . Stroke (cerebrum) ([place])    a. CT head 10/2014 showed occult strokes: "Old RIGHT basal ganglia lacunar infarct, small bilateral cerebellar infarcts."  . Type II or unspecified type diabetes mellitus without mention of complication, uncontrolled   . Unspecified tinnitus     Patient Active Problem List   Diagnosis Date Noted  . Chronic combined systolic and diastolic CHF (congestive heart failure) ([place]) 04/30/2015  . Hypertensive cardiovascular disease 04/30/2015  . Atrial fibrillation ([place]) 04/30/2015  . Chronic anticoagulation 04/30/2015  . Acute on  chronic systolic and diastolic heart failure, NYHA class 3 ([place]) 01/08/2015  . Hx of CABG 11/04/2014  . Elevated troponin 11/01/2014  . PAF (paroxysmal atrial fibrillation) ([place]) 11/01/2014  . Acute encephalopathy 10/31/2014  . Hypertensive urgency 10/31/2014  . DM (diabetes mellitus), type 2 with renal complications ([place]) 56/21/3086  . Secondary hypertension, unspecified   . Hypertensive encephalopathy 10/19/2014  . First degree heart block by electrocardiogram 02/19/2014  . Essential hypertension 02/19/2014  . Encounter for Medicare annual wellness exam 02/19/2014  . Gout, chronic 10/17/2013  . Hypokalemia 10/17/2013  . Hyperlipidemia LDL goal <100 10/17/2013  . Lower leg edema 06/06/2013  . Bradycardia 03/07/2013  . Pressure ulcer of coccygeal region, stage 2 12/19/2012  . Hypertensive renal disease 11/07/2012  . Rash and nonspecific skin eruption 11/07/2012  . Gout 05/31/2012  . Chronic kidney disease, stage 3 ([place]) 05/31/2012    Past Surgical History:  Procedure Laterality Date  . APPENDECTOMY    . CORONARY ARTERY BYPASS GRAFT  1995  . EYE SURGERY  2014  . PROSTATE SURGERY          Home Medications    Prior to Admission medications   Medication Sig Start Date End Date Taking? Authorizing Provider  allopurinol (ZYLOPRIM) 300 MG tablet TAKE 1/2 TABLET DAILY TO PREVENT GOUT ATTACK 11/05/14  Yes Rai, Ripudeep K, MD  aspirin EC 81 MG tablet Take 1 tablet (81 mg total) by mouth  daily. 02/11/15  Yes [name], MD  carvedilol (COREG) 25 MG tablet Take 1 tablet (25 mg total) by mouth 2 (two) times daily with a meal. To replace atenolol 25mg  12/07/16  Yes [name], MD  cloNIDine (CATAPRES) 0.3 MG tablet Take 1 tablet (0.3 mg total) by mouth 3 (three) times daily. 01/04/17  Yes [name], MD  ELIQUIS 5 MG TABS tablet TAKE 1 TABLET TWICE A DAY 02/16/17  Yes [name], MD  furosemide (LASIX) 40 MG tablet Take 40mg  in the morning and 20mg  in the  evening. 02/25/17  Yes Kilroy, Luke K, PA-C  hydrALAZINE (APRESOLINE) 50 MG tablet Take 1 tablet (50 mg total) by mouth 2 (two) times daily. 12/07/16  Yes [name], MD  insulin aspart (NOVOLOG) 100 UNIT/ML injection Inject into the skin as directed. 10 UNITS AM, 6 UNITS LUNCH, AND 10 UNITS PM ALL BEFORE MEALS   Yes [provider]  Insulin Detemir (LEVEMIR) 100 UNIT/ML Pen Inject into the skin daily. Pt takes 24 units at night time   Yes [provider]  Multiple Vitamin (MULTIVITAMIN) tablet Take 1 tablet by mouth daily.   Yes [provider]  simvastatin (ZOCOR) 20 MG tablet Take 20 mg by mouth daily.   Yes [provider]  spironolactone (ALDACTONE) 25 MG tablet Take 25 mg by mouth daily. 03/24/15  Yes [provider]  lisinopril (PRINIVIL,ZESTRIL) 20 MG tablet Take 1 tablet (20 mg total) by mouth at bedtime. 11/05/16   [name], MD  lisinopril (PRINIVIL,ZESTRIL) 20 MG tablet TAKE 1 TABLET DAILY (NEW   DOSE) 04/04/17   [name], MD    Family History Family History  Problem Relation Age of Onset  . Diabetes Mother   . Hypertension Mother   . Heart disease Father   . Cancer Sister        lung  . Cancer Sister        lung  . Heart attack Neg Hx   . Stroke Neg Hx     Social History Social History   Tobacco Use  . Smoking status: Never Smoker  . Smokeless tobacco: Current User    Types: Chew  Substance Use Topics  . Alcohol use: No  . Drug use: No     Allergies   Patient has no known allergies.   Review of Systems Review of Systems  Constitutional: Negative for fever.  HENT: Negative for congestion.   Eyes: Negative for redness.  Respiratory: Negative for shortness of breath.   Cardiovascular: Negative for chest pain.  Gastrointestinal: Negative for abdominal pain.  Genitourinary: Negative for dysuria.  Musculoskeletal: Negative for myalgias.  Skin: Negative for rash.  Neurological: Negative for  syncope.  Hematological: Does not bruise/bleed easily.  Psychiatric/Behavioral: Negative for confusion.     Physical Exam Updated Vital Signs BP 130/82 (BP Location: Left Arm)   Pulse 60   Temp 98.3 F (36.8 C) (Oral)   Resp 16   SpO2 98%   Physical Exam  Constitutional: He is oriented to person, place, and time. He appears well-developed and well-nourished. No distress.  HENT:  Head: Normocephalic and atraumatic.  Mouth/Throat: Oropharynx is clear and moist.  Eyes: Pupils are equal, round, and reactive to light. EOM are normal.  Neck: Neck supple.  Cardiovascular: Normal rate and regular rhythm.  Pulmonary/Chest: Effort normal and breath sounds normal. No respiratory distress.  Abdominal: Soft. Bowel sounds are normal. There is no tenderness.  Musculoskeletal: Normal range of motion.  Neurological: He is alert and oriented to person, place, and time. No cranial nerve deficit or sensory deficit. He exhibits normal muscle tone. Coordination normal.  Skin: Skin is warm.  Nursing note and vitals reviewed.    ED Treatments / Results  Labs (all labs ordered are listed, but only abnormal results are displayed) Labs Reviewed  COMPREHENSIVE METABOLIC PANEL - Abnormal; Notable for the following components:      Result Value   Chloride 100 (*)    Glucose, Bld 152 (*)    BUN 37 (*)    Creatinine, Ser 1.73 (*)    Total Protein 6.3 (*)    GFR calc non Af Amer 34 (*)    GFR calc Af Amer 40 (*)    All other components within normal limits  CBC WITH DIFFERENTIAL/PLATELET - Abnormal; Notable for the following components:   RBC 4.06 (*)    Hemoglobin 12.7 (*)    HCT 38.4 (*)    All other components within normal limits  I-STAT CG4 LACTIC ACID, ED     Results for orders placed or performed during the hospital encounter of 05/13/17  Comprehensive metabolic panel  Result Value Ref Range   Sodium 137 135 - 145 mmol/L   Potassium 4.2 3.5 - 5.1 mmol/L   Chloride 100 (L) 101 - 111  mmol/L   CO2 24 22 - 32 mmol/L   Glucose, Bld 152 (H) 65 - 99 mg/dL   BUN 37 (H) 6 - 20 mg/dL   Creatinine, Ser 1.73 (H) 0.61 - 1.24 mg/dL   Calcium 9.5 8.9 - 10.3 mg/dL   Total Protein 6.3 (L) 6.5 - 8.1 g/dL   Albumin 3.6 3.5 - 5.0 g/dL   AST 37 15 - 41 U/L   ALT 25 17 - 63 U/L   Alkaline Phosphatase 68 38 - 126 U/L   Total Bilirubin 0.7 0.3 - 1.2 mg/dL   GFR calc non Af Amer 34 (L) >60 mL/min   GFR calc Af Amer 40 (L) >60 mL/min   Anion gap 13 5 - 15  CBC with Differential  Result Value Ref Range   WBC 8.1 4.0 - 10.5 K/uL   RBC 4.06 (L) 4.22 - 5.81 MIL/uL   Hemoglobin 12.7 (L) 13.0 - 17.0 g/dL   HCT 38.4 (L) 39.0 - 52.0 %   MCV 94.6 78.0 - 100.0 fL   MCH 31.3 26.0 - 34.0 pg   MCHC 33.1 30.0 - 36.0 g/dL   RDW 13.3 11.5 - 15.5 %   Platelets 212 150 - 400 K/uL   Neutrophils Relative % 69 %   Neutro Abs 5.6 1.7 - 7.7 K/uL   Lymphocytes Relative 21 %   Lymphs Abs 1.7 0.7 - 4.0 K/uL   Monocytes Relative 6 %   Monocytes Absolute 0.5 0.1 - 1.0 K/uL   Eosinophils Relative 3 %   Eosinophils Absolute 0.3 0.0 - 0.7 K/uL   Basophils Relative 1 %   Basophils Absolute 0.1 0.0 - 0.1 K/uL  I-Stat CG4 Lactic Acid, ED  Result Value Ref Range   Lactic Acid, Venous 1.46 0.5 - 1.9 mmol/L   Dg Chest 2 View  Result Date: 05/03/2017 CLINICAL DATA:  LEFT lower lobe pneumonia, follow-up EXAM: CHEST - 2 VIEW COMPARISON:  04/06/2017 FINDINGS: Borderline enlargement of cardiac silhouette post CABG with slight pulmonary vascular congestion Mediastinal contours normal. Minimal RIGHT basilar atelectasis Persistent opacity at the posterior LEFT lung base which could represent  atelectasis or residual lower lobe infiltrate. Upper lungs clear. No pleural effusion or pneumothorax. Bones demineralized with scattered degenerative disc disease changes of the thoracic spine. IMPRESSION: Minimal RIGHT basilar atelectasis with persistent mild atelectasis versus infiltrate in LEFT lower lobe. Electronically Signed    By: Lavonia Dana M.D.   On: 05/03/2017 13:23    EKG None  Radiology No results found.  Procedures Procedures (including critical care time)  Medications Ordered in ED Medications - No data to display   Initial Impression / Assessment and Plan / ED Course  I have reviewed the triage vital signs and the nursing notes.  Pertinent labs & imaging results that were available during my care of the patient were reviewed by me and considered in my medical decision making (see chart for details).     Patient sent in from [place] office for hypotension and congestion with weakness for a week.  Patient without any pain alert and oriented.  Reportedly systolic blood pressures were in the 60s at the office.  Upon arrival here blood pressure was initially 98.  There was one recorded at 84 and blood pressure has been been more recently here at 12 noon around 112.  Patient with extensive work-up here in observation.  Time of discharge blood pressures were 497 026 systolic.  We only had one isolated blood pressure of 84 at 937 however the trend is been that everything since 9 this morning is been 98 systolic or greater.  Patient's labs without any significant abnormalities.  Patient nontoxic no acute distress.  Labs significant for no leukocytosis lactic acid was normal.  Patient did have some elevation in BUN and creatinine but not significantly off from baseline.  No anemia.  Patient observed stable for discharge back home will return for any recurrent blood pressure but pressures been very stable here without any intervention.  Possible that patient's blood pressure medicine may have been too strong at the time he was in the office or if possible that those blood pressures were erroneous.  Because it is been a significant improvement here again without any intervention.  Final Clinical Impressions(s) / ED Diagnoses   Final diagnoses:  Hypotension, unspecified hypotension type    ED Discharge  Orders    None       Fredia Sorrow, MD 05/23/17 [phone]

## 2017-06-28 ENCOUNTER — Telehealth: Payer: Self-pay | Admitting: Podiatry

## 2017-06-28 NOTE — Telephone Encounter (Signed)
I'm calling to speak to the nurse about my fathers appointment from 28 April 2017. If you could please call me back in the office at [phone]. I will be out on Friday but I will be available the other days. Thank you.

## 2017-06-28 NOTE — Telephone Encounter (Signed)
Left message to call back to discuss results 

## 2017-06-29 NOTE — Telephone Encounter (Signed)
[name] called for information concerning pt's bill. I told [name] I would forward her message to Lollie Marrow to discuss.

## 2017-07-05 ENCOUNTER — Telehealth: Payer: Self-pay | Admitting: Podiatry

## 2017-07-05 DIAGNOSIS — E1165 Type 2 diabetes mellitus with hyperglycemia: Secondary | ICD-10-CM | POA: Diagnosis not present

## 2017-07-05 DIAGNOSIS — R062 Wheezing: Secondary | ICD-10-CM | POA: Diagnosis not present

## 2017-07-05 DIAGNOSIS — N183 Chronic kidney disease, stage 3 (moderate): Secondary | ICD-10-CM | POA: Diagnosis not present

## 2017-07-05 DIAGNOSIS — Z794 Long term (current) use of insulin: Secondary | ICD-10-CM | POA: Diagnosis not present

## 2017-07-05 DIAGNOSIS — R05 Cough: Secondary | ICD-10-CM | POA: Diagnosis not present

## 2017-07-05 DIAGNOSIS — R042 Hemoptysis: Secondary | ICD-10-CM | POA: Diagnosis not present

## 2017-07-05 NOTE — Telephone Encounter (Signed)
I'm calling because I spoke to [place], RN about my father. She gave me the name of several people that she said would be giving me a call. Nobody has called me. I need someone to call me at my office [phone]. I just need to close the loop on this. I wanted to find out who was supposed to call me that has not called me yet. Thank you.

## 2017-07-05 NOTE — Telephone Encounter (Signed)
Routed note to S. Durant, and Elmer Bales.

## 2017-07-06 ENCOUNTER — Other Ambulatory Visit: Payer: Self-pay | Admitting: Geriatric Medicine

## 2017-07-06 ENCOUNTER — Ambulatory Visit
Admission: RE | Admit: 2017-07-06 | Discharge: 2017-07-06 | Disposition: A | Discharge status: Home / Self Care | Payer: Medicare Other | Source: Ambulatory Visit | Attending: Geriatric Medicine | Admitting: Geriatric Medicine

## 2017-07-06 DIAGNOSIS — I129 Hypertensive chronic kidney disease with stage 1 through stage 4 chronic kidney disease, or unspecified chronic kidney disease: Secondary | ICD-10-CM | POA: Diagnosis not present

## 2017-07-06 DIAGNOSIS — J90 Pleural effusion, not elsewhere classified: Secondary | ICD-10-CM | POA: Diagnosis not present

## 2017-07-06 DIAGNOSIS — R05 Cough: Secondary | ICD-10-CM | POA: Diagnosis not present

## 2017-07-06 DIAGNOSIS — E1165 Type 2 diabetes mellitus with hyperglycemia: Secondary | ICD-10-CM | POA: Diagnosis not present

## 2017-07-06 DIAGNOSIS — D509 Iron deficiency anemia, unspecified: Secondary | ICD-10-CM | POA: Diagnosis not present

## 2017-07-06 DIAGNOSIS — R042 Hemoptysis: Secondary | ICD-10-CM | POA: Diagnosis not present

## 2017-07-06 DIAGNOSIS — I700 Atherosclerosis of aorta: Secondary | ICD-10-CM | POA: Diagnosis not present

## 2017-07-06 MED ORDER — IOHEXOL 300 MG/ML  SOLN
75.0000 mL | Freq: Once | INTRAMUSCULAR | Status: AC | PRN
  Administered 2017-07-06: 75 mL via INTRAVENOUS

## 2017-07-26 ENCOUNTER — Encounter: Payer: Self-pay | Admitting: Cardiovascular Disease

## 2017-07-26 ENCOUNTER — Ambulatory Visit (INDEPENDENT_AMBULATORY_CARE_PROVIDER_SITE_OTHER): Payer: Medicare Other | Admitting: Cardiovascular Disease

## 2017-07-26 ENCOUNTER — Telehealth: Payer: Self-pay | Admitting: Cardiovascular Disease

## 2017-07-26 VITALS — BP 153/81 | HR 70 | Ht 71.0 in | Wt 202.6 lb

## 2017-07-26 DIAGNOSIS — R600 Localized edema: Secondary | ICD-10-CM

## 2017-07-26 DIAGNOSIS — Z951 Presence of aortocoronary bypass graft: Secondary | ICD-10-CM | POA: Diagnosis not present

## 2017-07-26 DIAGNOSIS — I10 Essential (primary) hypertension: Secondary | ICD-10-CM | POA: Diagnosis not present

## 2017-07-26 DIAGNOSIS — E785 Hyperlipidemia, unspecified: Secondary | ICD-10-CM

## 2017-07-26 DIAGNOSIS — I5043 Acute on chronic combined systolic (congestive) and diastolic (congestive) heart failure: Secondary | ICD-10-CM | POA: Diagnosis not present

## 2017-07-26 DIAGNOSIS — I480 Paroxysmal atrial fibrillation: Secondary | ICD-10-CM | POA: Diagnosis not present

## 2017-07-26 DIAGNOSIS — I4819 Other persistent atrial fibrillation: Secondary | ICD-10-CM

## 2017-07-26 DIAGNOSIS — I481 Persistent atrial fibrillation: Secondary | ICD-10-CM | POA: Diagnosis not present

## 2017-07-26 DIAGNOSIS — I5042 Chronic combined systolic (congestive) and diastolic (congestive) heart failure: Secondary | ICD-10-CM | POA: Diagnosis not present

## 2017-07-26 DIAGNOSIS — E7800 Pure hypercholesterolemia, unspecified: Secondary | ICD-10-CM | POA: Diagnosis not present

## 2017-07-26 NOTE — Assessment & Plan Note (Signed)
History of hyperlipidemia on statin therapy. We will recheck a lipid and liver profile 

## 2017-07-26 NOTE — Telephone Encounter (Signed)
Labs received and placed for scanning to dr berry

## 2017-07-26 NOTE — Telephone Encounter (Signed)
LMTCB  Patient seen today and lipid/liver were ordered.

## 2017-07-26 NOTE — Assessment & Plan Note (Signed)
History of coronary artery disease status post bypass grafting in 1995.  His last Myoview performed 02/06/2015 revealed mostly fixed defects without evidence of ischemia.  He denies chest pain.

## 2017-07-26 NOTE — Telephone Encounter (Signed)
New Message   Patients daughter is calling to provide lab results from labs that was drawn at [facility]. Please call.

## 2017-07-26 NOTE — Assessment & Plan Note (Signed)
Acute on chronic systolic and diastolic heart failure on oral diuretics.  His last 2D echo performed 01/02/2015 showed an ejection fraction of 40 to 45% with moderate MR he does have chronic lower extremity edema experiencing hemoptysis as well.  We will recheck a 2D echocardiogram.

## 2017-07-26 NOTE — Patient Instructions (Signed)
Medication Instructions:   NO CHNAGE  Labwork:  Your physician recommends that you HAVE LAB WORK TODAY  Testing/Procedures:  Your physician has requested that you have an echocardiogram. Echocardiography is a painless test that uses sound waves to create images of your heart. It provides your doctor with information about the size and shape of your heart and how well your heart's chambers and valves are working. This procedure takes approximately one hour. There are no restrictions for this procedure.    Follow-Up:  Your physician recommends that you schedule a follow-up appointment in: [place]  Your physician wants you to follow-up in: [place] will receive a reminder letter in the mail two months in advance. If you don't receive a letter, please call our office to schedule the follow-up appointment.    If you need a refill on your cardiac medications before your next appointment, please call your pharmacy.

## 2017-07-26 NOTE — Progress Notes (Signed)
07/26/2017 John Romero   1930-08-31  [id number]  Primary Physician [name], MD Primary Cardiologist: [name] MD John Carney, [place]  HPI:  John Romero is a 84 y.o.  moderately overweight widowed Caucasian male father of 2, grandfather to one grandchild is retired from working at [employer].I last saw him in the office  11/19/2016. He is accompanied by his daughter [name] today. I saw him in the hospital when he was admitted with hypertensive encephalopathy. He has a history of hypertension, hyperlipidemia and type 2 diabetes. He has chronic kidney disease stage III and new onset A. Fib. He had coronary artery bypass grafting back in 1995. A 2-D echo during his hospitalization showed normal left ventricular function. He saw [name] [facility] in the office for his initial posthospital follow-up on 11/19/14 and was doing well at that time. She did have a discussion with him regarding beginning oral anticoagulation given his high CHA2DSVASC2 score (7) however the patient declined at that time wishing to remain on aspirin. Over the last several weeks prior to his December appointment he had progressive weight gain, dyspnea or resting shortness of breath. He was clearly in heart failure and 5 mg 3+ pitting edema. I doubled his diuretics. 2-D echo that showed a decline in his ejection fraction from 55-60% to 40-45% with a new wall motion abnormality. He has slowly diuresed on oral diuretic. A Myoview stress test performed on 02/07/15 showed a small area of inferolateral scar with mild peri-infarct ischemia and an ejection fraction of 47%. We have talked about initiating oral anticoagulant therapy which he agreed to initiate. We began him on Eliquis . He's been well since. He denies chest pain or shortness of breath. Since I saw him last he's done relatively well. He is somewhat unstable on his feet. He has chronic lower extremity edema and does admit to dietary indiscretion with regards to  salt. He was seen in the emergency room with what sounds like a TIA. He has been seen by [name] PA-C in the office on 03/23/2017 and was stable at that time.  Continue to have lower extremity edema on diuretics.  Since I saw him 6 months ago his major issue is with hemoptysis.  He has been evaluated and told he had "chronic pneumonia and is been on antibiotics in the past.  His symptoms are worse in the morning.  A CT scan performed 07/06/2017 showed interstitial changes worrisome for alveolar hemorrhage hemorrhage.  Referral to pulmonologist was recommended.     Current Meds  Medication Sig  . allopurinol (ZYLOPRIM) 300 MG tablet TAKE 1/2 TABLET DAILY TO PREVENT GOUT ATTACK  . aspirin EC 81 MG tablet Take 1 tablet (81 mg total) by mouth daily.  . carvedilol (COREG) 25 MG tablet Take 1 tablet (25 mg total) by mouth 2 (two) times daily with a meal. To replace atenolol 25mg   . ELIQUIS 5 MG TABS tablet TAKE 1 TABLET TWICE A DAY  . furosemide (LASIX) 40 MG tablet Take 40mg  in the morning and 20mg  in the evening.  . hydrALAZINE (APRESOLINE) 50 MG tablet Take 1 tablet (50 mg total) by mouth 2 (two) times daily.  . insulin aspart (NOVOLOG) 100 UNIT/ML injection Inject into the skin as directed. 10 UNITS AM, 6 UNITS LUNCH, AND 10 UNITS PM ALL BEFORE MEALS  . Insulin Detemir (LEVEMIR) 100 UNIT/ML Pen Inject 28 Units into the skin daily. Pt takes 24 units at night time  .  Multiple Vitamin (MULTIVITAMIN) tablet Take 1 tablet by mouth daily.  . simvastatin (ZOCOR) 20 MG tablet Take 20 mg by mouth daily.  Marland Kitchen spironolactone (ALDACTONE) 25 MG tablet Take 25 mg by mouth daily.  . [DISCONTINUED] cloNIDine (CATAPRES) 0.3 MG tablet Take 1 tablet (0.3 mg total) by mouth 3 (three) times daily.  . [DISCONTINUED] lisinopril (PRINIVIL,ZESTRIL) 20 MG tablet Take 1 tablet (20 mg total) by mouth at bedtime.  . [DISCONTINUED] lisinopril (PRINIVIL,ZESTRIL) 20 MG tablet TAKE 1 TABLET DAILY (NEW   DOSE)     No Known  Allergies  Social History   Socioeconomic History  . Marital status: Married    Spouse name: Not on file  . Number of children: Not on file  . Years of education: Not on file  . Highest education level: Not on file  Occupational History  . Not on file  Social Needs  . Financial resource strain: Not on file  . Food insecurity:    Worry: Not on file    Inability: Not on file  . Transportation needs:    Medical: Not on file    Non-medical: Not on file  Tobacco Use  . Smoking status: Never Smoker  . Smokeless tobacco: Current User    Types: Chew  Substance and Sexual Activity  . Alcohol use: No  . Drug use: No  . Sexual activity: Not on file    Comment: unknown  Lifestyle  . Physical activity:    Days per week: Not on file    Minutes per session: Not on file  . Stress: Not on file  Relationships  . Social connections:    Talks on phone: Not on file    Gets together: Not on file    Attends religious service: Not on file    Active member of club or organization: Not on file    Attends meetings of clubs or organizations: Not on file    Relationship status: Not on file  . Intimate partner violence:    Fear of current or ex partner: Not on file    Emotionally abused: Not on file    Physically abused: Not on file    Forced sexual activity: Not on file  Other Topics Concern  . Not on file  Social History Narrative  . Not on file     Review of Systems: General: negative for chills, fever, night sweats or weight changes.  Cardiovascular: negative for chest pain, dyspnea on exertion, edema, orthopnea, palpitations, paroxysmal nocturnal dyspnea or shortness of breath Dermatological: negative for rash Respiratory: negative for cough or wheezing Urologic: negative for hematuria Abdominal: negative for nausea, vomiting, diarrhea, bright red blood per rectum, melena, or hematemesis Neurologic: negative for visual changes, syncope, or dizziness All other systems reviewed and  are otherwise negative except as noted above.    Blood pressure (!) 153/81, pulse 70, height 5\' 11"  (1.803 m), weight 202 lb 9.6 oz (91.9 kg).  General appearance: alert and no distress Neck: no adenopathy, no carotid bruit, no JVD, supple, symmetrical, trachea midline and thyroid not enlarged, symmetric, no tenderness/mass/nodules Lungs: clear to auscultation bilaterally Heart: irregularly irregular rhythm Extremities: 3+ bilateral lower extremity edema. Pulses: 2+ and symmetric Skin: Skin color, texture, turgor normal. No rashes or lesions Neurologic: Alert and oriented X 3, normal strength and tone. Normal symmetric reflexes. Normal coordination and gait  EKG atrial fibrillation with a ventricular response of 70 and occasional ectopic aberrantly conducted beats nonspecific ST and T wave changes.  I  personally reviewed this EKG.  ASSESSMENT AND PLAN:   Lower leg edema History of bilateral lower extremity edema on spironolactone and twice daily furosemide.  He is aware salt restriction.  We will recheck a 2D echo.  Continue current medications.  Hyperlipidemia LDL goal <100 History of hyperlipidemia on statin therapy.  We will recheck a lipid and liver profile.  Essential hypertension History of essential hypertension her blood pressure measured today 153/81.  He is on carvedilol, hydralazine and clonidine.  Continue current meds at current dosing.  PAF (paroxysmal atrial fibrillation) (HCC) History of atrial fibrillation rate controlled on Eliquis oral and coagulation.  Hx of CABG History of coronary artery disease status post bypass grafting in 1995.  His last Myoview performed 02/06/2015 revealed mostly fixed defects without evidence of ischemia.  He denies chest pain.  Acute on chronic systolic and diastolic heart failure, NYHA class 3 (HCC) Acute on chronic systolic and diastolic heart failure on oral diuretics.  His last 2D echo performed 01/02/2015 showed an ejection fraction  of 40 to 45% with moderate MR he does have chronic lower extremity edema experiencing hemoptysis as well.  We will recheck a 2D echocardiogram.      [name] MD [facility], [facility] 07/26/2017 9:15 AM

## 2017-07-26 NOTE — Assessment & Plan Note (Signed)
History of atrial fibrillation rate controlled on Eliquis oral and coagulation.

## 2017-07-26 NOTE — Assessment & Plan Note (Signed)
History of essential hypertension her blood pressure measured today 153/81.  He is on carvedilol, hydralazine and clonidine.  Continue current meds at current dosing.

## 2017-07-26 NOTE — Assessment & Plan Note (Signed)
History of bilateral lower extremity edema on spironolactone and twice daily furosemide.  He is aware salt restriction.  We will recheck a 2D echo.  Continue current medications.

## 2017-07-26 NOTE — Telephone Encounter (Signed)
Daughter returned call and informed me that patient had labs with Dr. Buddy Duty - included lipid panel and maybe liver function/CMET. She will fax to [phone] attn: Hilda Blades

## 2017-07-28 ENCOUNTER — Other Ambulatory Visit: Payer: Self-pay

## 2017-07-28 ENCOUNTER — Ambulatory Visit (HOSPITAL_COMMUNITY): Payer: Medicare Other | Attending: Internal Medicine

## 2017-07-28 DIAGNOSIS — Z951 Presence of aortocoronary bypass graft: Secondary | ICD-10-CM | POA: Diagnosis not present

## 2017-07-28 DIAGNOSIS — I440 Atrioventricular block, first degree: Secondary | ICD-10-CM | POA: Insufficient documentation

## 2017-07-28 DIAGNOSIS — E785 Hyperlipidemia, unspecified: Secondary | ICD-10-CM | POA: Diagnosis not present

## 2017-07-28 DIAGNOSIS — I080 Rheumatic disorders of both mitral and aortic valves: Secondary | ICD-10-CM | POA: Insufficient documentation

## 2017-07-28 DIAGNOSIS — I4891 Unspecified atrial fibrillation: Secondary | ICD-10-CM | POA: Diagnosis not present

## 2017-07-28 DIAGNOSIS — N183 Chronic kidney disease, stage 3 (moderate): Secondary | ICD-10-CM | POA: Diagnosis not present

## 2017-07-28 DIAGNOSIS — I5042 Chronic combined systolic (congestive) and diastolic (congestive) heart failure: Secondary | ICD-10-CM | POA: Diagnosis not present

## 2017-07-28 DIAGNOSIS — I130 Hypertensive heart and chronic kidney disease with heart failure and stage 1 through stage 4 chronic kidney disease, or unspecified chronic kidney disease: Secondary | ICD-10-CM | POA: Insufficient documentation

## 2017-07-28 DIAGNOSIS — I77810 Thoracic aortic ectasia: Secondary | ICD-10-CM | POA: Diagnosis not present

## 2017-07-28 DIAGNOSIS — E1122 Type 2 diabetes mellitus with diabetic chronic kidney disease: Secondary | ICD-10-CM | POA: Insufficient documentation

## 2017-08-01 ENCOUNTER — Ambulatory Visit (INDEPENDENT_AMBULATORY_CARE_PROVIDER_SITE_OTHER): Payer: Medicare Other | Admitting: Podiatry

## 2017-08-01 DIAGNOSIS — D689 Coagulation defect, unspecified: Secondary | ICD-10-CM | POA: Diagnosis not present

## 2017-08-01 DIAGNOSIS — I5020 Unspecified systolic (congestive) heart failure: Secondary | ICD-10-CM | POA: Diagnosis not present

## 2017-08-01 DIAGNOSIS — B351 Tinea unguium: Secondary | ICD-10-CM

## 2017-08-01 DIAGNOSIS — E1140 Type 2 diabetes mellitus with diabetic neuropathy, unspecified: Secondary | ICD-10-CM

## 2017-08-01 DIAGNOSIS — I700 Atherosclerosis of aorta: Secondary | ICD-10-CM | POA: Diagnosis not present

## 2017-08-01 DIAGNOSIS — E1149 Type 2 diabetes mellitus with other diabetic neurological complication: Secondary | ICD-10-CM

## 2017-08-01 DIAGNOSIS — L84 Corns and callosities: Secondary | ICD-10-CM | POA: Diagnosis not present

## 2017-08-01 DIAGNOSIS — I129 Hypertensive chronic kidney disease with stage 1 through stage 4 chronic kidney disease, or unspecified chronic kidney disease: Secondary | ICD-10-CM | POA: Diagnosis not present

## 2017-08-01 DIAGNOSIS — N183 Chronic kidney disease, stage 3 (moderate): Secondary | ICD-10-CM | POA: Diagnosis not present

## 2017-08-01 DIAGNOSIS — M79675 Pain in left toe(s): Secondary | ICD-10-CM | POA: Diagnosis not present

## 2017-08-01 DIAGNOSIS — M2040 Other hammer toe(s) (acquired), unspecified foot: Secondary | ICD-10-CM

## 2017-08-01 DIAGNOSIS — M79674 Pain in right toe(s): Secondary | ICD-10-CM

## 2017-08-01 DIAGNOSIS — J8410 Pulmonary fibrosis, unspecified: Secondary | ICD-10-CM | POA: Diagnosis not present

## 2017-08-01 NOTE — Progress Notes (Signed)
Subjective:   Patient ID: John Romero, male   DOB: 84 y.o.   MRN: [id number]   HPI Patient presents with chronic nail disease 1-5 both feet with incurvation of the beds and pain with palpation and painful lesion 2nd toe right foot   ROS      Objective:  Physical Exam  Neurovascular status intact with thick yellow brittle nailbeds 1-5 both feet that are painful. 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 Discussed diabetic shoes which patient has had in past with great success

## 2017-08-05 ENCOUNTER — Telehealth: Payer: Self-pay | Admitting: Cardiovascular Disease

## 2017-08-05 NOTE — Telephone Encounter (Signed)
New Message:      Pt had a ultrasound on 7/11 and pt's daughter is calling to get the results from that test.

## 2017-08-05 NOTE — Telephone Encounter (Signed)
Spoke with pt's daughter and updated with echo results. Daughter verbalized for future she would like all results called instead of released to mychart.

## 2017-08-22 ENCOUNTER — Other Ambulatory Visit: Payer: Self-pay | Admitting: Cardiovascular Disease

## 2017-08-22 NOTE — Telephone Encounter (Signed)
Rx request sent to pharmacy.  

## 2017-08-26 ENCOUNTER — Encounter: Payer: Self-pay | Admitting: Pulmonary Disease

## 2017-08-26 ENCOUNTER — Ambulatory Visit (INDEPENDENT_AMBULATORY_CARE_PROVIDER_SITE_OTHER): Payer: Medicare Other | Admitting: Pulmonary Disease

## 2017-08-26 VITALS — BP 128/70 | HR 60 | Ht 71.0 in | Wt 201.2 lb

## 2017-08-26 DIAGNOSIS — R05 Cough: Secondary | ICD-10-CM | POA: Diagnosis not present

## 2017-08-26 DIAGNOSIS — R059 Cough, unspecified: Secondary | ICD-10-CM

## 2017-08-26 MED ORDER — FLUTTER DEVI: 0 refills | Status: DC

## 2017-08-26 MED ORDER — FLUTICASONE FUROATE-VILANTEROL 200-25 MCG/INH IN AEPB: 1.0000 | INHALATION_SPRAY | Freq: Every day | RESPIRATORY_TRACT | 0 refills | Status: AC

## 2017-08-26 MED ORDER — FLUTTER DEVI: 0 refills | Status: AC

## 2017-08-26 NOTE — Patient Instructions (Addendum)
We will give you a sample of Breo and send in a prescription I you feel like the inhaler then call pharmacy to get the prescription filled Use Mucinex, stay well-hydrated  We also give you flutter valve for help with clearing the secretions Follow-up in 3 months.

## 2017-08-26 NOTE — Progress Notes (Signed)
John Romero    [id number]    06/15/1930  Primary Care Physician:Stoneking, Christiane Ha, MD  Referring Physician: [name], MD [address] [place], [place] [address]  Chief complaint: Consult for cough, chest congestion, intermittent hemoptysis  HPI: 84 year old with history of coronary artery disease, atrial fibrillation, hypertension, heart failure. Has complaints of chest congestion, clear mucus, intermittent hemoptysis for the past 6 months.  Has dyspnea with exertion.  No symptoms at rest, no wheezing, fevers, chills.  He has used Mucinex over-the-counter for this without improvement in symptoms  He had a CT scan done on 07/06/2017 which showed groundglass opacities and changes concerning for interstitial lung disease, pulmonary fibrosis.  Pets: No pets Occupation: Used to work in Librarian, academic in Engineer, materials.  After discharge he worked for [employer]: May have exposure to asbestos.  He worked briefly in in Sales executive, TEFL teacher. He recently remodeled a bathroom at his home which may have had asbestos in it. Smoking history: 5-pack-year smoker.  Quit in 1960s Travel history: Originally from [place].  He still traveled all over the world for leisure with his wife.  No significant recent travel  Outpatient Encounter Medications as of 08/26/2017  Medication Sig  . allopurinol (ZYLOPRIM) 300 MG tablet TAKE 1/2 TABLET DAILY TO PREVENT GOUT ATTACK  . aspirin EC 81 MG tablet Take 1 tablet (81 mg total) by mouth daily.  . carvedilol (COREG) 25 MG tablet Take 1 tablet (25 mg total) by mouth 2 (two) times daily with a meal. To replace atenolol 25mg   . cloNIDine (CATAPRES) 0.2 MG tablet TK 1 T PO TID  . ELIQUIS 5 MG TABS tablet TAKE 1 TABLET TWICE A DAY  . furosemide (LASIX) 40 MG tablet Take 40mg  in the morning and 20mg  in the evening.  . hydrALAZINE (APRESOLINE) 50 MG tablet Take 1 tablet (50 mg total) by mouth 2 (two) times  daily.  . insulin aspart (NOVOLOG) 100 UNIT/ML injection Inject into the skin as directed. 10 UNITS AM, 6 UNITS LUNCH, AND 10 UNITS PM ALL BEFORE MEALS  . Insulin Detemir (LEVEMIR) 100 UNIT/ML Pen Inject 28 Units into the skin daily.   . Multiple Vitamin (MULTIVITAMIN) tablet Take 1 tablet by mouth daily.  . simvastatin (ZOCOR) 20 MG tablet Take 20 mg by mouth daily.  Marland Kitchen spironolactone (ALDACTONE) 25 MG tablet Take 25 mg by mouth daily.   No facility-administered encounter medications on file as of 08/26/2017.     Allergies as of 08/26/2017  . (No Known Allergies)    Past Medical History:  Diagnosis Date  . CAD (coronary artery disease)    a. s/p CABG, details unclear.  . CKD (chronic kidney disease), stage III ([place])   . Edema   . Essential hypertension   . First degree atrioventricular block   . Gout, unspecified   . Gout, unspecified   . Hyperlipidemia   . Hypertensive encephalopathy 10/2014  . Hypertensive heart disease    a. 2D echo 11/01/14: hypokinesis of basal inferior and inferolateral walls, severe focal basal hypertrophy of the septum, EF 55-60%, mild AI, mod LAE.   . Malignant neoplasm of prostate ([place])   . Old myocardial infarction   . PAF (paroxysmal atrial fibrillation) ([place])    a. Dx 10/2014.   . Stroke (cerebrum) ([place])    a. CT head 10/2014 showed occult strokes: "Old RIGHT basal ganglia lacunar infarct, small bilateral cerebellar  infarcts."  . Type II or unspecified type diabetes mellitus without mention of complication, uncontrolled   . Unspecified tinnitus     Past Surgical History:  Procedure Laterality Date  . APPENDECTOMY    . CORONARY ARTERY BYPASS GRAFT  1995  . EYE SURGERY  2014  . PROSTATE SURGERY      Family History  Problem Relation Age of Onset  . Diabetes Mother   . Hypertension Mother   . Heart disease Father   . Cancer Sister        lung  . Cancer Sister        lung  . Heart attack Neg Hx   . Stroke Neg Hx     Social History    Socioeconomic History  . Marital status: Married    Spouse name: Not on file  . Number of children: Not on file  . Years of education: Not on file  . Highest education level: Not on file  Occupational History  . Not on file  Social Needs  . Financial resource strain: Not on file  . Food insecurity:    Worry: Not on file    Inability: Not on file  . Transportation needs:    Medical: Not on file    Non-medical: Not on file  Tobacco Use  . Smoking status: Never Smoker  . Smokeless tobacco: Current User    Types: Chew  Substance and Sexual Activity  . Alcohol use: No  . Drug use: No  . Sexual activity: Not on file    Comment: unknown  Lifestyle  . Physical activity:    Days per week: Not on file    Minutes per session: Not on file  . Stress: Not on file  Relationships  . Social connections:    Talks on phone: Not on file    Gets together: Not on file    Attends religious service: Not on file    Active member of club or organization: Not on file    Attends meetings of clubs or organizations: Not on file    Relationship status: Not on file  . Intimate partner violence:    Fear of current or ex partner: Not on file    Emotionally abused: Not on file    Physically abused: Not on file    Forced sexual activity: Not on file  Other Topics Concern  . Not on file  Social History Narrative  . Not on file    Review of systems: Review of Systems  Constitutional: Negative for fever and chills.  HENT: Negative.   Eyes: Negative for blurred vision.  Respiratory: as per HPI  Cardiovascular: Negative for chest pain and palpitations.  Gastrointestinal: Negative for vomiting, diarrhea, blood per rectum. Genitourinary: Negative for dysuria, urgency, frequency and hematuria.  Musculoskeletal: Negative for myalgias, back pain and joint pain.  Skin: Negative for itching and rash.  Neurological: Negative for dizziness, tremors, focal weakness, seizures and loss of consciousness.   Endo/Heme/Allergies: Negative for environmental allergies.  Psychiatric/Behavioral: Negative for depression, suicidal ideas and hallucinations.  All other systems reviewed and are negative.  Physical Exam: Blood pressure 128/70, pulse 60, height 5\' 11"  (1.803 m), weight 201 lb 3.2 oz (91.3 kg), SpO2 96 %. Gen:      No acute distress HEENT:  EOMI, sclera anicteric Neck:     No masses; no thyromegaly Lungs:    Bilateral basal crackles.  No wheeze CV:         Regular rate  and rhythm; systolic murmur Abd:      + bowel sounds; soft, non-tender; no palpable masses, no distension Ext:    Mild clubbing Skin:      Warm and dry; no rash Neuro: alert and oriented x 3 Psych: normal mood and affect:  Data Reviewed: CT chest 07/06/2017- groundglass attenuations in the mid to lower lungs.  Septal thickening in the left lower lobe.  Subpleural reticulation, possible honeycombing, traction bronchiectasis.  I have reviewed the images personally  Assessment:  Consult for cough, congestion and intermittent hemoptysis Likely has chronic bronchitis, possible COPD.  There is no concerning mass on previous lung imaging.  Suspect that his hemoptysis is due to coughing, mucosal trauma in the setting of Eliquis anticoagulation.  I will give him a sample of Breo inhaler, flutter valve for clearance of secretion and advised him to use Mucinex over-the-counter  Interstitial lung disease His CT from June 2019 suggestive of basal pulmonary fibrosis, possible IPF as there is suggestion of honeycombing.  He also has mild clubbing and bilateral basal crackles. The previous CT scan was not optimal to fully evaluate the lungs and I have recommended that he get pulmonary function test and a high-resolution CT but he would like to avoid these tests for now   If symptoms continue then we can revisit this.  Plan/Recommendations: - Breo inhaler - Mucinex, flutter valve  Marshell Garfinkel MD [facility] 08/26/2017, 3:51 PM  CC: [name], MD

## 2017-08-29 ENCOUNTER — Ambulatory Visit: Payer: Medicare Other | Admitting: Orthotics

## 2017-08-29 DIAGNOSIS — E1140 Type 2 diabetes mellitus with diabetic neuropathy, unspecified: Secondary | ICD-10-CM

## 2017-08-29 DIAGNOSIS — E1149 Type 2 diabetes mellitus with other diabetic neurological complication: Principal | ICD-10-CM

## 2017-08-29 DIAGNOSIS — M2040 Other hammer toe(s) (acquired), unspecified foot: Secondary | ICD-10-CM

## 2017-08-29 NOTE — Progress Notes (Signed)

## 2017-09-29 ENCOUNTER — Ambulatory Visit (INDEPENDENT_AMBULATORY_CARE_PROVIDER_SITE_OTHER): Payer: Medicare Other | Admitting: Orthotics

## 2017-09-29 DIAGNOSIS — M2040 Other hammer toe(s) (acquired), unspecified foot: Secondary | ICD-10-CM

## 2017-09-29 DIAGNOSIS — M2042 Other hammer toe(s) (acquired), left foot: Secondary | ICD-10-CM

## 2017-09-29 DIAGNOSIS — M2041 Other hammer toe(s) (acquired), right foot: Secondary | ICD-10-CM | POA: Diagnosis not present

## 2017-09-29 DIAGNOSIS — E1149 Type 2 diabetes mellitus with other diabetic neurological complication: Secondary | ICD-10-CM | POA: Diagnosis not present

## 2017-09-29 DIAGNOSIS — E1140 Type 2 diabetes mellitus with diabetic neuropathy, unspecified: Secondary | ICD-10-CM

## 2017-09-29 DIAGNOSIS — L84 Corns and callosities: Secondary | ICD-10-CM

## 2017-09-29 NOTE — Progress Notes (Signed)

## 2017-10-10 ENCOUNTER — Telehealth: Payer: Self-pay | Admitting: Pulmonary Disease

## 2017-10-10 NOTE — Telephone Encounter (Signed)
Patient was advised Dr. Annamaria Boots is not taking any new pulmonary patients. Patient asked if I could put a message back to see if John Romero would be willing to make an exception, as his sister, John Romero is one of Dr. Janee Morn patients.  Patient also states he's needing an appointment in the near future as he is not feeling better from 08/26/2017 visit.  Offered him appt with NP but he declined and said he will call back if needs to be seen for acute visit.

## 2017-10-10 NOTE — Telephone Encounter (Signed)
I have called the patient he has advised that he has trouble understanding Dr. Vaughan Browner and woul like to know if it would be okay if he could switch to Dr. Annamaria Boots. Dr.Mannam  please advise if you are okay with this patient switching providers  An Dr. Annamaria Boots please advise if you can take on this patient.   Pre office protocol  I am sending this to both providers.

## 2017-10-10 NOTE — Telephone Encounter (Signed)
CY please advise would you be willing to take on patient as a new patient. Thank you.

## 2017-10-10 NOTE — Telephone Encounter (Signed)
I would be willing to see him if needed- would need to fit into an available opening to establish with me.

## 2017-10-10 NOTE — Telephone Encounter (Signed)
Called and spoke with patient, patient has been scheduled for 10/21/17 at 11:30 ok per Joellen Jersey. Nothing further needed.

## 2017-10-21 ENCOUNTER — Encounter: Payer: Self-pay | Admitting: Internal Medicine

## 2017-10-21 ENCOUNTER — Ambulatory Visit (INDEPENDENT_AMBULATORY_CARE_PROVIDER_SITE_OTHER): Payer: Medicare Other | Admitting: Internal Medicine

## 2017-10-21 ENCOUNTER — Other Ambulatory Visit (INDEPENDENT_AMBULATORY_CARE_PROVIDER_SITE_OTHER): Payer: Medicare Other

## 2017-10-21 VITALS — BP 114/72 | HR 75 | Ht 71.0 in | Wt 203.0 lb

## 2017-10-21 DIAGNOSIS — R0600 Dyspnea, unspecified: Secondary | ICD-10-CM | POA: Diagnosis not present

## 2017-10-21 DIAGNOSIS — Z23 Encounter for immunization: Secondary | ICD-10-CM

## 2017-10-21 DIAGNOSIS — R042 Hemoptysis: Secondary | ICD-10-CM | POA: Diagnosis not present

## 2017-10-21 DIAGNOSIS — R059 Cough, unspecified: Secondary | ICD-10-CM | POA: Insufficient documentation

## 2017-10-21 DIAGNOSIS — J849 Interstitial pulmonary disease, unspecified: Secondary | ICD-10-CM

## 2017-10-21 DIAGNOSIS — R05 Cough: Secondary | ICD-10-CM | POA: Diagnosis not present

## 2017-10-21 LAB — BASIC METABOLIC PANEL
BUN: 34 mg/dL — ABNORMAL HIGH (ref 6–23)
CALCIUM: 9.4 mg/dL (ref 8.4–10.5)
CHLORIDE: 98 meq/L (ref 96–112)
CO2: 30 meq/L (ref 19–32)
CREATININE: 1.78 mg/dL — AB (ref 0.40–1.50)
GFR: 38.75 mL/min — ABNORMAL LOW (ref >60.00)
GLUCOSE: 393 mg/dL — AB (ref 70–99)
Potassium: 4.6 mEq/L (ref 3.5–5.1)
Sodium: 135 mEq/L (ref 135–145)

## 2017-10-21 LAB — CBC WITH DIFFERENTIAL/PLATELET
BASOS ABS: 0.1 10*3/uL (ref 0.0–0.1)
BASOS PCT: 1.1 % (ref 0.0–3.0)
Eosinophils Absolute: 0.2 10*3/uL (ref 0.0–0.7)
Eosinophils Relative: 2.3 % (ref 0.0–5.0)
HEMATOCRIT: 39 % (ref 39.0–52.0)
Hemoglobin: 12.9 g/dL — ABNORMAL LOW (ref 13.0–17.0)
LYMPHS ABS: 1.1 10*3/uL (ref 0.7–4.0)
LYMPHS PCT: 15.2 % (ref 12.0–46.0)
MCHC: 33 g/dL (ref 30.0–36.0)
MCV: 94.6 fl (ref 78.0–100.0)
MONOS PCT: 7.7 % (ref 3.0–12.0)
Monocytes Absolute: 0.5 10*3/uL (ref 0.1–1.0)
NEUTROS ABS: 5.2 10*3/uL (ref 1.4–7.7)
NEUTROS PCT: 73.7 % (ref 43.0–77.0)
PLATELETS: 215 10*3/uL (ref 150.0–400.0)
RBC: 4.12 Mil/uL — ABNORMAL LOW (ref 4.22–5.81)
RDW: 14.1 % (ref 11.5–15.5)
WBC: 7 10*3/uL (ref 4.0–10.5)

## 2017-10-21 LAB — BRAIN NATRIURETIC PEPTIDE: PRO B NATRI PEPTIDE: 269 pg/mL — AB (ref 0.0–100.0)

## 2017-10-21 MED ORDER — GLYCOPYRROLATE-FORMOTEROL 9-4.8 MCG/ACT IN AERO: 2.0000 | INHALATION_SPRAY | Freq: Two times a day (BID) | RESPIRATORY_TRACT | 0 refills | Status: DC

## 2017-10-21 NOTE — Patient Instructions (Addendum)
Order-  Schedule CT chest high resolution   -  interstitial lung disease protocol   Dx ILD, hemoptysis  Order- Flu vax senior  Sample Bevespi      Inhale 2 puffs, twice daily  Order- lab- CBC w diff, B MET, D-dimer, BNP    Dx dyspnea, hemoptysis, CHF  Please call if we can help

## 2017-10-21 NOTE — Assessment & Plan Note (Addendum)
Chest CT 07/06/2017-possible mild interstitial lung disease, density in left base which may be from bleeding together with atelectasis, chronic aspiration, or  possibly localized edema compared with right base.  He is not a good candidate for bronchoscopy or needle biopsy, especially if he is not getting worse. Plan-update CT chest with high-resolution views, 4 months after initial study.  Labs for CBC with differential, be met, d-dimer,BNP. Try Bevespi as a LABA/ LAMA to see if it helps cough and improves airway clearance.

## 2017-10-21 NOTE — Progress Notes (Signed)
HPI:  Seen 08/26/17 by Dr Vaughan Browner:  Extracted notes from that visit: 84 year old with history of coronary artery disease, atrial fibrillation, hypertension, heart failure, DM Has complaints of chest congestion, clear mucus, intermittent hemoptysis for the past 6 months.  Has dyspnea with exertion.  No symptoms at rest, no wheezing, fevers, chills.  He has used Mucinex over-the-counter for this without improvement in symptoms  He had a CT scan done on 07/06/2017 which showed groundglass opacities and changes concerning for interstitial lung disease, pulmonary fibrosis.  Pets: No pets Occupation: Used to work in Librarian, academic in Engineer, materials.  After discharge he worked for [employer]: May have exposure to asbestos.  He worked briefly in in Sales executive, TEFL teacher. He recently remodeled a bathroom at his home which may have had asbestos in it. Smoking history: 5-pack-year smoker.  Quit in 1960s Travel history: Originally from [place].  He still travels Assessment:  Consult for cough, congestion and intermittent hemoptysis Likely has chronic bronchitis, possible COPD.  There is no concerning mass on previous lung imaging.  Suspect that his hemoptysis is due to coughing, mucosal trauma in the setting of Eliquis anticoagulation.  I will give him a sample of Breo inhaler, flutter valve for clearance of secretion and advised him to use Mucinex over-the-counter  Interstitial lung disease His CT from June 2019 suggestive of basal pulmonary fibrosis, possible IPF as there is suggestion of honeycombing.  He also has mild clubbing and bilateral basal crackles. The previous CT scan was not optimal to fully evaluate the lungs and I have recommended that he get pulmonary function test and a high-resolution CT but he would like to avoid these tests for now   If symptoms continue then we can revisit this.  Plan/Recommendations: - Breo inhaler - Mucinex, flutter  valve  /////////////////////////////////////////////////////////////////////////////////  10/21/2017- Pulmonary Consult: Former Psychologist, educational.  Patient is here today with his daughter.  I take care of his sister for COPD.  I suspect she prompted the request for change of provider although he is somewhat hard of hearing and says he had trouble understanding at his first visit. He and daughter give history of persistent cough with some chest congestion, occasional wheeze and chest tightness, but little shortness of breath.  This is been going on for 6 or 8 months and has not changed with 2 antibiotics, Breo inhaler.  He has had rusty but not grossly bloody sputum and reports Dr. Berry/cardiology advised against reducing Eliquis in context of chronic atrial fibrillation. He has not gotten notably better or worse over this period of time.  He does feel somewhat worse trying to lie down flat. He denies adenopathy, fever, leg pain or swelling, other bleeding. Quit smoking at least 30 years ago.  Worked for [employer] with little occupational hazard exposure.  He says respiratory protection measures were quite good.  Prostate cancer treated around 1970 without known recurrence. I reviewed CT image from June, discussed radiology recommendation for repeat study with high-resolution, and compared the image to most recent CXR.  Left hemidiaphragm mildly elevated with splenic flexure gas and potential for some atelectasis in the left lower lung zone.  The CT suggests fairly significant increased density in the left lower lung zone-possibly atelectasis or localized edema.  There are minor peripheral basilar changes of interstitial disease that I would consider of low priority for an 84 year old man. CT chest 07/06/2017- IMPRESSION: 1. The appearance of the lungs is concerning for potential  interstitial lung disease. However, in the setting of history of hemoptysis, some of these findings could be  attributable to areas of alveolar hemorrhage. Outpatient referral to Pulmonology is recommended in the near future for further evaluation. Consideration for follow-up high-resolution chest CT is also suggested. 2. Cardiomegaly with left atrial dilatation. There is also a filling defect in the tip of the left atrial appendage, concerning for left atrial appendage thrombus. While this could simply reflect "pseudo thrombus" related to timing of the contrast bolus, further evaluation with transesophageal echocardiography should be considered if clinically appropriate. If present, this thrombus would place the patient at risk for systemic embolization. 3. Aortic atherosclerosis, in addition to left main and 3 vessel coronary artery disease. Status post median sternotomy for CABG including LIMA to the LAD. Echo 07/28/2017- - Normal LV systolic function; mild LVH; sclerotic aortic valve   with mild AI; mildly dilated aortic root; mild LAE, RAE and RVE.  EF 55-60%.  Prior to Admission medications   Medication Sig Start Date End Date Taking? Authorizing Provider  allopurinol (ZYLOPRIM) 300 MG tablet TAKE 1/2 TABLET DAILY TO PREVENT GOUT ATTACK 11/05/14  Yes Rai, Ripudeep K, MD  aspirin EC 81 MG tablet Take 1 tablet (81 mg total) by mouth daily. 02/11/15  Yes [name], MD  carvedilol (COREG) 25 MG tablet Take 1 tablet (25 mg total) by mouth 2 (two) times daily with a meal. To replace atenolol 25mg  12/07/16  Yes [name], MD  cloNIDine (CATAPRES) 0.2 MG tablet TK 1 T PO TID 05/13/17  Yes [provider]  ELIQUIS 5 MG TABS tablet TAKE 1 TABLET TWICE A DAY 08/22/17  Yes [name], MD  furosemide (LASIX) 40 MG tablet Take 40mg  in the morning and 20mg  in the evening. 02/25/17  Yes Kilroy, Luke K, PA-C  hydrALAZINE (APRESOLINE) 50 MG tablet Take 1 tablet (50 mg total) by mouth 2 (two) times daily. 12/07/16  Yes [name], MD  insulin aspart (NOVOLOG) 100 UNIT/ML  injection Inject into the skin as directed. 10 UNITS AM, 6 UNITS LUNCH, AND 10 UNITS PM ALL BEFORE MEALS   Yes [provider]  Insulin Detemir (LEVEMIR) 100 UNIT/ML Pen Inject 28 Units into the skin daily.    Yes [provider]  Multiple Vitamin (MULTIVITAMIN) tablet Take 1 tablet by mouth daily.   Yes [provider]  Respiratory Therapy Supplies (FLUTTER) DEVI Use as directed 08/26/17  Yes Mannam, Praveen, MD  simvastatin (ZOCOR) 20 MG tablet Take 20 mg by mouth daily.   Yes [provider]  spironolactone (ALDACTONE) 25 MG tablet Take 25 mg by mouth daily. 03/24/15  Yes [provider]  Glycopyrrolate-Formoterol (BEVESPI AEROSPHERE) 9-4.8 MCG/ACT AERO Inhale 2 puffs into the lungs 2 (two) times daily. 10/21/17   Deneise Lever, MD   Past Medical History:  Diagnosis Date  . CAD (coronary artery disease)    a. s/p CABG, details unclear.  . CKD (chronic kidney disease), stage III ([place])   . Edema   . Essential hypertension   . First degree atrioventricular block   . Gout, unspecified   . Gout, unspecified   . Hyperlipidemia   . Hypertensive encephalopathy 10/2014  . Hypertensive heart disease    a. 2D echo 11/01/14: hypokinesis of basal inferior and inferolateral walls, severe focal basal hypertrophy of the septum, EF 55-60%, mild AI, mod LAE.   . Malignant neoplasm of prostate ([place])   . Old myocardial infarction   .  PAF (paroxysmal atrial fibrillation) ([place])    a. Dx 10/2014.   . Stroke (cerebrum) ([place])    a. CT head 10/2014 showed occult strokes: "Old RIGHT basal ganglia lacunar infarct, small bilateral cerebellar infarcts."  . Type II or unspecified type diabetes mellitus without mention of complication, uncontrolled   . Unspecified tinnitus    Past Surgical History:  Procedure Laterality Date  . APPENDECTOMY    . CORONARY ARTERY BYPASS GRAFT  1995  . EYE SURGERY  2014  . PROSTATE SURGERY     Family History  Problem Relation Age of  Onset  . Diabetes Mother   . Hypertension Mother   . Heart disease Father   . Cancer Sister        lung  . Cancer Sister        lung  . Heart attack Neg Hx   . Stroke Neg Hx    Social History   Socioeconomic History  . Marital status: Widowed    Spouse name: Not on file  . Number of children: Not on file  . Years of education: Not on file  . Highest education level: Not on file  Occupational History  . Not on file  Social Needs  . Financial resource strain: Not on file  . Food insecurity:    Worry: Not on file    Inability: Not on file  . Transportation needs:    Medical: Not on file    Non-medical: Not on file  Tobacco Use  . Smoking status: Never Smoker  . Smokeless tobacco: Current User    Types: Chew  Substance and Sexual Activity  . Alcohol use: No  . Drug use: No  . Sexual activity: Not on file    Comment: unknown  Lifestyle  . Physical activity:    Days per week: Not on file    Minutes per session: Not on file  . Stress: Not on file  Relationships  . Social connections:    Talks on phone: Not on file    Gets together: Not on file    Attends religious service: Not on file    Active member of club or organization: Not on file    Attends meetings of clubs or organizations: Not on file    Relationship status: Not on file  . Intimate partner violence:    Fear of current or ex partner: Not on file    Emotionally abused: Not on file    Physically abused: Not on file    Forced sexual activity: Not on file  Other Topics Concern  . Not on file  Social History Narrative  . Not on file   ROS-see HPI   + = positive Constitutional:    weight loss, night sweats, fevers, chills, fatigue, lassitude,  HEENT:    headaches, difficulty swallowing, tooth/dental problems, sore throat,       sneezing, itching, ear ache, nasal congestion, post nasal drip, snoring CV:    chest pain, orthopnea, PND, swelling in lower extremities, anasarca,                                   dizziness, palpitations Resp:   shortness of breath with exertion or at rest.                +productive cough,   non-productive cough, +coughing up of blood.              +  change in color of mucus.  wheezing.   Skin:    rash or lesions. GI:  No-   heartburn, indigestion, abdominal pain, nausea, vomiting, diarrhea,                 change in bowel habits, loss of appetite GU: dysuria, change in color of urine, no urgency or frequency.   flank pain. MS:   joint pain, stiffness, decreased range of motion, back pain. Neuro-     nothing unusual Psych:  change in mood or affect.  depression or anxiety.   memory loss.  OBJ- Physical Exam General- Alert, Oriented, Affect-appropriate, Distress- none acute, + wheelchair Skin- rash-none, lesions- none, excoriation- none Lymphadenopathy- none Head- atraumatic            Eyes- Gross vision intact, PERRLA, conjunctivae and secretions clear            Ears- Hearing, canals-normal            Nose- Clear, no-Septal dev, mucus, polyps, erosion, perforation             Throat- Mallampati II , mucosa clear , drainage- none, tonsils- atrophic Neck- flexible , trachea midline, no stridor , thyroid nl, carotid no bruit Chest - symmetrical excursion , unlabored           Heart/CV- +IRR/ AFib , no murmur , no gallop  , no rub, nl s1 s2                           - JVD- none , edema+1, stasis changes- none, varices- none           Lung- + fine bibasilar crackles especially left base, wheeze- none, cough- none , dullness+ L                           base, rub- none           Chest wall-  Abd-  Br/ Gen/ Rectal- Not done, not indicated Extrem- cyanosis- none, clubbing, none, atrophy- none, strength- nl Neuro-+ interaction and response to questions is a little slow as if he is considering, or may be from decreased hearing.

## 2017-10-21 NOTE — Assessment & Plan Note (Signed)
Stable low-grade hemoptysis/rust tinged sputum reported over 6-8 months on Eliquis.  His cardiologist does not want to reduce the Eliquis dose per patient report, because of A. fib.  I suspect the source may be in the left lower lobe and some of the density there may be from parenchymal bleeding, otherwise no particular suspicious area seen.

## 2017-10-22 LAB — D-DIMER, QUANTITATIVE: D-Dimer, Quant: 0.33 mcg/mL FEU (ref <0.50)

## 2017-10-24 DIAGNOSIS — Z794 Long term (current) use of insulin: Secondary | ICD-10-CM | POA: Diagnosis not present

## 2017-10-24 DIAGNOSIS — N183 Chronic kidney disease, stage 3 (moderate): Secondary | ICD-10-CM | POA: Diagnosis not present

## 2017-10-24 DIAGNOSIS — E1165 Type 2 diabetes mellitus with hyperglycemia: Secondary | ICD-10-CM | POA: Diagnosis not present

## 2017-10-25 ENCOUNTER — Telehealth: Payer: Self-pay | Admitting: Internal Medicine

## 2017-10-25 NOTE — Telephone Encounter (Signed)
Called and spoke with patient he is aware of results and verbalized understanding. Nothing further needed.  

## 2017-10-25 NOTE — Telephone Encounter (Signed)
Called and spoke with patients daughter per DPR. Advised her of results from Dr. Annamaria Boots. She is aware of results and verbalized understanding. She stated that the patient will be following up with his cardiologist and primary care. Nothing further needed. Patient will still have CT scan this week.

## 2017-10-26 ENCOUNTER — Telehealth: Payer: Self-pay | Admitting: Internal Medicine

## 2017-10-26 NOTE — Telephone Encounter (Signed)
Spoke with [name] She was asking if the pt needs to fast for his CT  I advised that this is not necessary  She verbalized understanding and nothing further needed

## 2017-10-28 ENCOUNTER — Ambulatory Visit
Admission: RE | Admit: 2017-10-28 | Discharge: 2017-10-28 | Disposition: A | Discharge status: Home / Self Care | Payer: Medicare Other | Source: Ambulatory Visit | Attending: Internal Medicine | Admitting: Internal Medicine

## 2017-10-28 DIAGNOSIS — R042 Hemoptysis: Secondary | ICD-10-CM

## 2017-10-28 DIAGNOSIS — J849 Interstitial pulmonary disease, unspecified: Secondary | ICD-10-CM | POA: Diagnosis not present

## 2017-10-31 ENCOUNTER — Ambulatory Visit (INDEPENDENT_AMBULATORY_CARE_PROVIDER_SITE_OTHER): Payer: Medicare Other | Admitting: Podiatry

## 2017-10-31 ENCOUNTER — Encounter: Payer: Self-pay | Admitting: Podiatry

## 2017-10-31 DIAGNOSIS — B351 Tinea unguium: Secondary | ICD-10-CM

## 2017-10-31 DIAGNOSIS — M79674 Pain in right toe(s): Secondary | ICD-10-CM | POA: Diagnosis not present

## 2017-10-31 DIAGNOSIS — M79675 Pain in left toe(s): Secondary | ICD-10-CM | POA: Diagnosis not present

## 2017-10-31 DIAGNOSIS — E1149 Type 2 diabetes mellitus with other diabetic neurological complication: Secondary | ICD-10-CM

## 2017-10-31 DIAGNOSIS — L84 Corns and callosities: Secondary | ICD-10-CM | POA: Diagnosis not present

## 2017-10-31 DIAGNOSIS — E1140 Type 2 diabetes mellitus with diabetic neuropathy, unspecified: Secondary | ICD-10-CM

## 2017-11-01 NOTE — Progress Notes (Signed)
Subjective:   Patient ID: John Romero, male   DOB: 84 y.o.   MRN: [id number]   HPI Patient presents stating his nails are thickened incurvated and he cannot cut them himself and they are painful   ROS      Objective:  Physical Exam  Neurovascular status intact with thick yellow brittle nailbeds 1-5 both feet that are painful     Assessment:  Mycotic nail infection with pain 1-5 both feet     Plan:  H&P condition reviewed and debrided nailbeds 1-5 both feet with no iatrogenic bleeding noted

## 2017-11-02 ENCOUNTER — Telehealth: Payer: Self-pay | Admitting: Internal Medicine

## 2017-11-02 NOTE — Telephone Encounter (Signed)
Pt daughter [name] is calling back  [phone]

## 2017-11-02 NOTE — Telephone Encounter (Signed)
LMTCB for John Romero-patients daughter

## 2017-11-02 NOTE — Telephone Encounter (Signed)
Called and spoke with pt's daughter [name] stating to her for pt to try Delsym cough syrup first to see if this helps.  Stated to her if this does not help pt to call our office back. [name] expressed understanding. Nothing further needed.

## 2017-11-02 NOTE — Progress Notes (Signed)
Spoke with pt and notified of results per Dr.Young. Pt verbalized understanding and denied any questions. He states that he is not having a fever, but he is coughing up "rusty" colored sputum. Do you want to call him in something for this? Please advise thanks

## 2017-11-02 NOTE — Telephone Encounter (Signed)
Ok to call in prometh codeine 200 ml, 5 ml every 6 hours if needed for cough  Or can use otc Delsym cough syrup

## 2017-11-02 NOTE — Telephone Encounter (Signed)
Notes recorded by Deneise Lever, MD on 10/31/2017 at 11:03 AM EDT CT chest- there is an area of possible bleeding or infection in the lower left lung. If no fever or infected sputum, I suggest we wait on this and discuss in detail at upcoming appointment in November. If feeling sick, let me know before then.  Called and spoke with pt's daughter [name] letting her know the results of pt's CT scan results. [name] expressed understanding.  [name] stated pt has been coughing and wants to know if there was a cough syrup that pt could take knowing that pt does have high BP.  Dr. Annamaria Boots, please advise cough syrups that pt can take, if there are any OTC or if there is something Rx that we should send in for pt instead.  Thanks!  No Known Allergies   Current Outpatient Medications:  .  allopurinol (ZYLOPRIM) 300 MG tablet, TAKE 1/2 TABLET DAILY TO PREVENT GOUT ATTACK, Disp: 90 tablet, Rfl: 1 .  aspirin EC 81 MG tablet, Take 1 tablet (81 mg total) by mouth daily., Disp: 90 tablet, Rfl: 3 .  carvedilol (COREG) 25 MG tablet, Take 1 tablet (25 mg total) by mouth 2 (two) times daily with a meal. To replace atenolol 25mg , Disp: 180 tablet, Rfl: 3 .  cloNIDine (CATAPRES) 0.2 MG tablet, TK 1 T PO TID, Disp: , Rfl: 5 .  ELIQUIS 5 MG TABS tablet, TAKE 1 TABLET TWICE A DAY, Disp: 180 tablet, Rfl: 1 .  furosemide (LASIX) 40 MG tablet, Take 40mg  in the morning and 20mg  in the evening., Disp: 45 tablet, Rfl: 6 .  Glycopyrrolate-Formoterol (BEVESPI AEROSPHERE) 9-4.8 MCG/ACT AERO, Inhale 2 puffs into the lungs 2 (two) times daily., Disp: 1 Inhaler, Rfl: 0 .  hydrALAZINE (APRESOLINE) 50 MG tablet, Take 1 tablet (50 mg total) by mouth 2 (two) times daily., Disp: 180 tablet, Rfl: 3 .  insulin aspart (NOVOLOG) 100 UNIT/ML injection, Inject into the skin as directed. 10 UNITS AM, 6 UNITS LUNCH, AND 10 UNITS PM ALL BEFORE MEALS, Disp: , Rfl:  .  Insulin Detemir (LEVEMIR) 100 UNIT/ML Pen, Inject 28 Units into the skin  daily. , Disp: , Rfl:  .  Multiple Vitamin (MULTIVITAMIN) tablet, Take 1 tablet by mouth daily., Disp: , Rfl:  .  Respiratory Therapy Supplies (FLUTTER) DEVI, Use as directed, Disp: 1 each, Rfl: 0 .  simvastatin (ZOCOR) 20 MG tablet, Take 20 mg by mouth daily., Disp: , Rfl:  .  spironolactone (ALDACTONE) 25 MG tablet, Take 25 mg by mouth daily., Disp: , Rfl:

## 2017-11-03 ENCOUNTER — Other Ambulatory Visit: Payer: Self-pay | Admitting: Internal Medicine

## 2017-11-03 MED ORDER — AMOXICILLIN-POT CLAVULANATE 875-125 MG PO TABS: 1.0000 | ORAL_TABLET | Freq: Two times a day (BID) | ORAL | 0 refills | Status: DC

## 2017-11-03 NOTE — Progress Notes (Signed)
Spoke with the pt and notified of recs and he verbalized understanding  Rx was sent to pharm

## 2017-11-15 ENCOUNTER — Encounter

## 2017-11-15 ENCOUNTER — Other Ambulatory Visit: Payer: Self-pay | Admitting: Cardiovascular Disease

## 2017-11-15 ENCOUNTER — Encounter: Payer: Self-pay | Admitting: Cardiovascular Disease

## 2017-11-15 ENCOUNTER — Ambulatory Visit (INDEPENDENT_AMBULATORY_CARE_PROVIDER_SITE_OTHER): Payer: Medicare Other | Admitting: Cardiovascular Disease

## 2017-11-15 DIAGNOSIS — Z951 Presence of aortocoronary bypass graft: Secondary | ICD-10-CM | POA: Diagnosis not present

## 2017-11-15 DIAGNOSIS — E785 Hyperlipidemia, unspecified: Secondary | ICD-10-CM

## 2017-11-15 DIAGNOSIS — I480 Paroxysmal atrial fibrillation: Secondary | ICD-10-CM

## 2017-11-15 DIAGNOSIS — R600 Localized edema: Secondary | ICD-10-CM

## 2017-11-15 DIAGNOSIS — I10 Essential (primary) hypertension: Secondary | ICD-10-CM | POA: Diagnosis not present

## 2017-11-15 DIAGNOSIS — I5042 Chronic combined systolic (congestive) and diastolic (congestive) heart failure: Secondary | ICD-10-CM | POA: Diagnosis not present

## 2017-11-15 NOTE — Assessment & Plan Note (Signed)
History of essential hypertension her blood pressure measured at 146/78.  He is on carvedilol, clonidine and hydralazine.

## 2017-11-15 NOTE — Assessment & Plan Note (Signed)
Chronic diastolic heart failure on diuretics echo performed 07/28/2017 that revealed an ejection fraction of 55 to 60% improved from his prior EF of 40% by 2D echo 01/02/2015.  He had normal valvular function as well.

## 2017-11-15 NOTE — Assessment & Plan Note (Signed)
Chronic lower extremity edema on spironolactone and furosemide

## 2017-11-15 NOTE — Progress Notes (Signed)
11/15/2017 John Romero Romero   [birth year]  [id number]  Primary Physician John Romero Manes, MD Primary Cardiologist: John Romero Harp MD John Romero Romero, [place]  HPI:  John Romero Romero is a 84 y.o.  moderately overweight widowed Caucasian male father of 2, grandfather to one grandchild is retired from working at [employer].I last saw him in the office  07/26/2017. He is accompanied by his daughter John Romero Romero today. I saw him in the hospital when he was admitted with hypertensive encephalopathy. He has a history of hypertension, hyperlipidemia and type 2 diabetes. He has chronic kidney disease stage III and new onset A. Fib. He had coronary artery bypass grafting back in 1995. A 2-D echo during his hospitalization showed normal left ventricular function. He saw John Romero Romero in the office for his initial posthospital follow-up on 11/19/14 and was doing well at that time. She did have a discussion with him regarding beginning oral anticoagulation given his high CHA2DSVASC2 score (7) however the patient declined at that time wishing to remain on aspirin. Over the last several weeks prior to his December appointment he had progressive weight gain, dyspnea or resting shortness of breath. He was clearly in heart failure and 5 mg 3+ pitting edema. I doubled his diuretics. 2-D echo that showed a decline in his ejection fraction from 55-60% to 40-45% with a new wall motion abnormality. He has slowly diuresed on oral diuretic. A Myoview stress test performed on 02/07/15 showed a small area of inferolateral scar with mild peri-infarct ischemia and an ejection fraction of 47%. We have talked about initiating oral anticoagulant therapy which he agreed to initiate. We began him on Eliquis . He's been well since. He denies chest pain or shortness of breath. Since I saw him last he's done relatively well. He is somewhat unstable on his feet. He has chronic lower extremity edema and does admit to dietary indiscretion with regards  to salt. He wasseen in the emergency room with what sounds like a TIA. He has been seen by John Romero Ransom PA-C in the office on 03/23/2017 and was stable at that time.  Continue to have lower extremity edema on diuretics.  Since I saw him 6 months ago his major issue is with hemoptysis.  He has been evaluated and told he had "chronic pneumonia and is been on antibiotics in the past.  His symptoms are worse in the morning.  A CT scan performed 07/06/2017 showed interstitial changes worrisome for alveolar hemorrhage hemorrhage.  Referral to pulmonologist was recommended. He has seen John Romero Romero for pulmonary evaluation.  His major complaint is of coughing up phlegm.  He had a chest CT that has showed pulmonary fibrosis and interstitial lung disease with a working diagnosis being chronic bronchitis.  Current Meds  Medication Sig  . allopurinol (ZYLOPRIM) 300 MG tablet TAKE 1/2 TABLET DAILY TO PREVENT GOUT ATTACK  . amoxicillin-clavulanate (AUGMENTIN) 875-125 MG tablet Take 1 tablet by mouth 2 (two) times daily.  Marland Kitchen aspirin EC 81 MG tablet Take 1 tablet (81 mg total) by mouth daily.  . carvedilol (COREG) 25 MG tablet TAKE 1 TABLET TWICE DAILY  WITH MEALS (TO REPLACE     ATENOLOL 25MG )  . cloNIDine (CATAPRES) 0.2 MG tablet TK 1 T PO TID  . ELIQUIS 5 MG TABS tablet TAKE 1 TABLET TWICE A DAY  . furosemide (LASIX) 40 MG tablet Take 40mg  in the morning and 20mg  in the evening.  . Glycopyrrolate-Formoterol (BEVESPI AEROSPHERE) 9-4.8 MCG/ACT AERO Inhale  2 puffs into the lungs 2 (two) times daily.  . hydrALAZINE (APRESOLINE) 50 MG tablet Take 1 tablet (50 mg total) by mouth 2 (two) times daily.  . insulin aspart (NOVOLOG) 100 UNIT/ML injection Inject into the skin as directed. 12 UNITS AM, 8 UNITS LUNCH, AND 12 UNITS PM ALL BEFORE MEALS  . Insulin Detemir (LEVEMIR) 100 UNIT/ML Pen Inject 24 Units into the skin daily.   . Multiple Vitamin (MULTIVITAMIN) tablet Take 1 tablet by mouth daily.  Marland Kitchen Respiratory Therapy  Supplies (FLUTTER) DEVI Use as directed  . simvastatin (ZOCOR) 20 MG tablet Take 20 mg by mouth daily.  Marland Kitchen spironolactone (ALDACTONE) 25 MG tablet Take 25 mg by mouth daily.     No Known Allergies  Social History   Socioeconomic History  . Marital status: Widowed    Spouse name: Not on file  . Number of children: Not on file  . Years of education: Not on file  . Highest education level: Not on file  Occupational History  . Not on file  Social Needs  . Financial resource strain: Not on file  . Food insecurity:    Worry: Not on file    Inability: Not on file  . Transportation needs:    Medical: Not on file    Non-medical: Not on file  Tobacco Use  . Smoking status: Never Smoker  . Smokeless tobacco: Current User    Types: Chew  Substance and Sexual Activity  . Alcohol use: No  . Drug use: No  . Sexual activity: Not on file    Comment: unknown  Lifestyle  . Physical activity:    Days per week: Not on file    Minutes per session: Not on file  . Stress: Not on file  Relationships  . Social connections:    Talks on phone: Not on file    Gets together: Not on file    Attends religious service: Not on file    Active member of club or organization: Not on file    Attends meetings of clubs or organizations: Not on file    Relationship status: Not on file  . Intimate partner violence:    Fear of current or ex partner: Not on file    Emotionally abused: Not on file    Physically abused: Not on file    Forced sexual activity: Not on file  Other Topics Concern  . Not on file  Social History Narrative  . Not on file     Review of Systems: General: negative for chills, fever, night sweats or weight changes.  Cardiovascular: negative for chest pain, dyspnea on exertion, edema, orthopnea, palpitations, paroxysmal nocturnal dyspnea or shortness of breath Dermatological: negative for rash Respiratory: negative for cough or wheezing Urologic: negative for  hematuria Abdominal: negative for nausea, vomiting, diarrhea, bright red blood per rectum, melena, or hematemesis Neurologic: negative for visual changes, syncope, or dizziness All other systems reviewed and are otherwise negative except as noted above.    Blood pressure (!) 146/78, pulse (!) 57, height 5\' 11"  (1.803 m).  General appearance: alert and no distress Neck: no adenopathy, no carotid bruit, no JVD, supple, symmetrical, trachea midline and thyroid not enlarged, symmetric, no tenderness/mass/nodules Lungs: clear to auscultation bilaterally Heart: regular rate and rhythm, S1, S2 normal, no murmur, click, rub or gallop Extremities: 1-2+ pitting edema bilaterally Pulses: 2+ and symmetric Skin: Skin color, texture, turgor normal. No rashes or lesions Neurologic: Alert and oriented X 3, normal strength and  tone. Normal symmetric reflexes. Normal coordination and gait  EKG not performed today  ASSESSMENT AND PLAN:   Lower leg edema Chronic lower extremity edema on spironolactone and furosemide  Hyperlipidemia LDL goal <100 History of hyperlipidemia not on statin therapy  Essential hypertension History of essential hypertension her blood pressure measured at 146/78.  He is on carvedilol, clonidine and hydralazine.  PAF (paroxysmal atrial fibrillation) (HCC) History of paroxysmal atrial fibrillation on Eliquis oral anticoagulation.  Hx of CABG History of CABG in 1995 by Dr. Redmond Pulling with a Myoview stress test January 2017 that was low risk.  Patient denies chest pain.  Chronic combined systolic and diastolic CHF (congestive heart failure) (HCC) Chronic diastolic heart failure on diuretics echo performed 07/28/2017 that revealed an ejection fraction of 55 to 60% improved from his prior EF of 40% by 2D echo 01/02/2015.  He had normal valvular function as well.      John Romero Harp MD FACP,FACC,FAHA, [facility] 11/15/2017 3:52 PM

## 2017-11-15 NOTE — Assessment & Plan Note (Signed)
History of CABG in 1995 by Dr. Redmond Pulling with a Myoview stress test January 2017 that was low risk.  Patient denies chest pain.

## 2017-11-15 NOTE — Patient Instructions (Signed)
Medication Instructions:  Your physician recommends that you continue on your current medications as directed. Please refer to the Current Medication list given to you today.  If you need a refill on your cardiac medications before your next appointment, please call your pharmacy.   Lab work: none If you have labs (blood work) drawn today and your tests are completely normal, you will receive your results only by: Marland Kitchen MyChart Message (if you have MyChart) OR . A paper copy in the mail If you have any lab test that is abnormal or we need to change your treatment, we will call you to review the results.  Testing/Procedures: none  Follow-Up: At [facility], you and your health needs are our priority.  As part of our continuing mission to provide you with exceptional heart care, we have created designated Provider Care Teams.  These Care Teams include your primary Cardiologist (physician) and Advanced Practice Providers (APPs -  Physician Assistants and Nurse Practitioners) who all work together to provide you with the care you need, when you need it. We request that you follow-up in: 6 months with an extender and in 12 months with Dr Andria Rhein will receive a reminder letter in the mail two months in advance. If you don't receive a letter, please call our office to schedule the follow-up appointment.     Any Other Special Instructions Will Be Listed Below (If Applicable).

## 2017-11-15 NOTE — Assessment & Plan Note (Signed)
History of paroxysmal atrial fibrillation on Eliquis oral anticoagulation. 

## 2017-11-15 NOTE — Assessment & Plan Note (Signed)
History of hyperlipidemia not on statin therapy. 

## 2017-11-24 ENCOUNTER — Ambulatory Visit (INDEPENDENT_AMBULATORY_CARE_PROVIDER_SITE_OTHER): Payer: Medicare Other | Admitting: Internal Medicine

## 2017-11-24 ENCOUNTER — Ambulatory Visit (INDEPENDENT_AMBULATORY_CARE_PROVIDER_SITE_OTHER)
Admission: RE | Admit: 2017-11-24 | Discharge: 2017-11-24 | Disposition: A | Discharge status: Home / Self Care | Payer: Medicare Other | Source: Ambulatory Visit | Attending: Internal Medicine | Admitting: Internal Medicine

## 2017-11-24 ENCOUNTER — Other Ambulatory Visit: Payer: Medicare Other

## 2017-11-24 VITALS — BP 148/68 | HR 58 | Ht 71.0 in | Wt 204.6 lb

## 2017-11-24 DIAGNOSIS — R042 Hemoptysis: Secondary | ICD-10-CM | POA: Diagnosis not present

## 2017-11-24 DIAGNOSIS — R05 Cough: Secondary | ICD-10-CM | POA: Diagnosis not present

## 2017-11-24 DIAGNOSIS — R059 Cough, unspecified: Secondary | ICD-10-CM

## 2017-11-24 DIAGNOSIS — I5042 Chronic combined systolic (congestive) and diastolic (congestive) heart failure: Secondary | ICD-10-CM

## 2017-11-24 DIAGNOSIS — R918 Other nonspecific abnormal finding of lung field: Secondary | ICD-10-CM | POA: Diagnosis not present

## 2017-11-24 NOTE — Patient Instructions (Addendum)
Order- lab- Sputum C&S-       Routine, fungal, AFB/ fluorochrome     Dx hemoptysis              Lab- Sputum Cytology  Order- CXR    Dx hemoptysis

## 2017-11-24 NOTE — Progress Notes (Signed)
HPI:  Seen 08/26/17 by Dr Vaughan Browner:  Extracted notes from that visit: 84 year old with history of coronary artery disease, atrial fibrillation, hypertension, heart failure, DM Has complaints of chest congestion, clear mucus, intermittent hemoptysis for the past 6 months.  Has dyspnea with exertion.  No symptoms at rest, no wheezing, fevers, chills.  He has used Mucinex over-the-counter for this without improvement in symptoms  He had a CT scan done on 07/06/2017 which showed groundglass opacities and changes concerning for interstitial lung disease, pulmonary fibrosis.  Pets: No pets Occupation: Used to work in Librarian, academic in Engineer, materials.  After discharge he worked for [employer]: May have exposure to asbestos.  He worked briefly in in Sales executive, TEFL teacher. He recently remodeled a bathroom at his home which may have had asbestos in it. Smoking history: 5-pack-year smoker.  Quit in 1960s Travel history: Originally from [place].  He still travels Assessment:  Consult for cough, congestion and intermittent hemoptysis Likely has chronic bronchitis, possible COPD.  There is no concerning mass on previous lung imaging.  Suspect that his hemoptysis is due to coughing, mucosal trauma in the setting of Eliquis anticoagulation.  I will give him a sample of Breo inhaler, flutter valve for clearance of secretion and advised him to use Mucinex over-the-counter  Interstitial lung disease His CT from June 2019 suggestive of basal pulmonary fibrosis, possible IPF as there is suggestion of honeycombing.  He also has mild clubbing and bilateral basal crackles. The previous CT scan was not optimal to fully evaluate the lungs and I have recommended that he get pulmonary function test and a high-resolution CT but he would like to avoid these tests for now   If symptoms continue then we can revisit this.  Plan/Recommendations: - Breo inhaler - Mucinex, flutter  valve  /////////////////////////////////////////////////////////////////////////////////  10/21/2017- Pulmonary Consult: Former Psychologist, educational.  Patient is here today with his daughter.  I take care of his sister for COPD.  I suspect she prompted the request for change of provider although he is somewhat hard of hearing and says he had trouble understanding at his first visit. He and daughter give history of persistent cough with some chest congestion, occasional wheeze and chest tightness, but little shortness of breath.  This is been going on for 6 or 8 months and has not changed with 2 antibiotics, Breo inhaler.  He has had rusty but not grossly bloody sputum and reports Dr. Berry/cardiology advised against reducing Eliquis in context of chronic atrial fibrillation. He has not gotten notably better or worse over this period of time.  He does feel somewhat worse trying to lie down flat. He denies adenopathy, fever, leg pain or swelling, other bleeding. Quit smoking at least 30 years ago.  Worked for [employer] with little occupational hazard exposure.  He says respiratory protection measures were quite good.  Prostate cancer treated around 1970 without known recurrence. I reviewed CT image from June, discussed radiology recommendation for repeat study with high-resolution, and compared the image to most recent CXR.  Left hemidiaphragm mildly elevated with splenic flexure gas and potential for some atelectasis in the left lower lung zone.  The CT suggests fairly significant increased density in the left lower lung zone-possibly atelectasis or localized edema.  There are minor peripheral basilar changes of interstitial disease that I would consider of low priority for an 84 year old man. CT chest 07/06/2017- IMPRESSION: 1. The appearance of the lungs is concerning for potential  interstitial lung disease. However, in the setting of history of hemoptysis, some of these findings could be  attributable to areas of alveolar hemorrhage. Outpatient referral to Pulmonology is recommended in the near future for further evaluation. Consideration for follow-up high-resolution chest CT is also suggested. 2. Cardiomegaly with left atrial dilatation. There is also a filling defect in the tip of the left atrial appendage, concerning for left atrial appendage thrombus. While this could simply reflect "pseudo thrombus" related to timing of the contrast bolus, further evaluation with transesophageal echocardiography should be considered if clinically appropriate. If present, this thrombus would place the patient at risk for systemic embolization. 3. Aortic atherosclerosis, in addition to left main and 3 vessel coronary artery disease. Status post median sternotomy for CABG including LIMA to the LAD. Echo 07/28/2017- - Normal LV systolic function; mild LVH; sclerotic aortic valve   with mild AI; mildly dilated aortic root; mild LAE, RAE and RVE.  EF 55-60%.  11/24/2017-84 year old male never smoker followed for chronic cough, ILD, hemoptysis, complicated by chronic A. fib, ASCVD/MI, CABG/chronic systolic and diastolic CHF, CVA, CKD 3, DM 2, HBP, gout, prostate cancer, No benefit from Bevespi or flutter device.  Continues to chew tobacco. Labs 10/21/2017-hemoglobin 12.9, ProBNP 269H, DDimer 0.33, Glu 393H, Creat 1.78,  His daughter brings him today.  He still reports scant blood-tinged sputum in the morning and sometimes a little later in the day if he feels need to clear his throat.  There is been no gross bleeding. We discussed labs, including blood sugar, renal function, mild anemia and indication of mild fluid overload.  We reviewed his chest CT image and report.  There is bibasilar fibrosis, but the left lower lobe parenchymal density without discrete mass suggests this is the source of blood.  A loose malignancy or local bleeding could look like this.  He feels this illness has been going on  for months, without fever, chills or purulent sputum and despite several antibiotics which makes a bacterial infection unlikely. He does not feel badly but does want this problem resolved.  He is reluctant to have more testing and interventions and takes time to think through our discussions. CT chest High Res 10/28/2017 IMPRESSION: 1. Spectrum of findings suggestive of basilar predominant fibrotic interstitial lung disease with mild honeycombing, without significant change since 07/06/2017 chest CT. Findings are indeterminate for UIP per consensus guidelines: Diagnosis of Idiopathic Pulmonary Fibrosis: An Official ATS/ERS/JRS/ALAT Clinical Practice Guideline. [place], Iss 5, [phone], Sep 18 2016. Differential includes fibrotic nonspecific interstitial pneumonia (NSIP) given the prominent ground-glass component. Follow-up high-resolution chest CT suggested in 12 months to assess for temporal pattern stability. 2. New patchy consolidation in the superior segment left lower lobe, suggestive of superimposed pneumonia or alveolar hemorrhage given the reported history of hemoptysis. Suggest follow-up chest CT in 3-6 months to document resolution of the new consolidation. Aortic Atherosclerosis (ICD10-I70.0) and Emphysema (ICD10-J43.9).  ROS-see HPI   + = positive Constitutional:    weight loss, night sweats, fevers, chills, fatigue, lassitude,  HEENT:    headaches, difficulty swallowing, tooth/dental problems, sore throat,       sneezing, itching, ear ache, nasal congestion, post nasal drip, snoring CV:    chest pain, orthopnea, PND, swelling in lower extremities, anasarca,  dizziness, palpitations Resp:   shortness of breath with exertion or at rest.                +productive cough,   non-productive cough, +coughing up of blood.              +change in color of mucus.  wheezing.   Skin:    rash or  lesions. GI:  No-   heartburn, indigestion, abdominal pain, nausea, vomiting, diarrhea,                 change in bowel habits, loss of appetite GU: dysuria, change in color of urine, no urgency or frequency.   flank pain. MS:   joint pain, stiffness, decreased range of motion, back pain. Neuro-     nothing unusual Psych:  change in mood or affect.  depression or anxiety.   memory loss.  OBJ- Physical Exam General- Alert, Oriented, Affect-appropriate, Distress- none acute, + wheelchair Skin- rash-none, lesions- none, excoriation- none Lymphadenopathy- none Head- atraumatic            Eyes- Gross vision intact, PERRLA, conjunctivae and secretions clear            Ears- Hearing, canals-normal            Nose- Clear, no-Septal dev, mucus, polyps, erosion, perforation             Throat- Mallampati II , mucosa clear , drainage- none, tonsils- atrophic Neck- flexible , trachea midline, no stridor , thyroid nl, carotid no bruit Chest - symmetrical excursion , unlabored           Heart/CV- +IRR/ AFib , no murmur , no gallop  , no rub, nl s1 s2                           - JVD- none , edema+1, stasis changes- none, varices- none           Lung- + coarse bibasilar crackles especially left base, wheeze- none, cough- none , dullness+ L                           base, rub- none           Chest wall-  Abd-  Br/ Gen/ Rectal- Not done, not indicated Extrem- cyanosis- none, clubbing, none, atrophy- none, strength- nl Neuro-+ interaction and response to questions is a little slow as if he is considering, or may be from decreased hearing.

## 2017-11-25 ENCOUNTER — Encounter: Payer: Self-pay | Admitting: Internal Medicine

## 2017-11-25 ENCOUNTER — Other Ambulatory Visit: Payer: Medicare Other

## 2017-11-25 DIAGNOSIS — R042 Hemoptysis: Secondary | ICD-10-CM | POA: Diagnosis not present

## 2017-11-25 NOTE — Assessment & Plan Note (Signed)
I expect we will recommend bronchoscopy.  He wanted to try expectorated sputum first, realizing the cytology yield would be lower than with direct bronchoscopy.

## 2017-11-25 NOTE — Assessment & Plan Note (Addendum)
ProBNP 269H, he is not in overt heart failure at this visit.  Followed by cardiology.

## 2017-11-25 NOTE — Assessment & Plan Note (Signed)
Multifactorial cough.  Mild hemoptysis likely relates to the persistent streaky airspace disease in the left base which is not really consolidated and lacks a discrete mass. Plan-I have asked him to consider bronchoscopy to evaluate the left lower lobe.  In hopes it will avoid an invasive procedure, we discussed and will try expectorated sputum for cultures and cytology first.

## 2017-11-28 ENCOUNTER — Other Ambulatory Visit: Payer: Medicare Other

## 2017-11-28 ENCOUNTER — Telehealth: Payer: Self-pay | Admitting: Internal Medicine

## 2017-11-28 NOTE — Telephone Encounter (Signed)
Called courtney, unable to reach left message to give [place] a cal back.

## 2017-11-29 LAB — NON-GYN, SPECIMEN A

## 2017-11-29 LAB — CYTOLOGY - NON PAP

## 2017-11-29 NOTE — Telephone Encounter (Signed)
Spoke with [employer] with [employer]. States that someone from our office has already called and advised her that the sample that was provided by the pt was sputum.

## 2017-12-07 ENCOUNTER — Telehealth: Payer: Self-pay | Admitting: Internal Medicine

## 2017-12-07 MED ORDER — LEVOFLOXACIN 750 MG PO TABS: 750.0000 mg | ORAL_TABLET | Freq: Every day | ORAL | 0 refills | Status: DC

## 2017-12-07 NOTE — Telephone Encounter (Signed)
Called and spoke with pt's daughter [name] letting her know the results of the sputum culture as well as the cxr. Stated to her due to pt having positive growth of two different bacteria that showed sensitivity to levaquin, CY stated for pt to take levaquin to help clear this up.  [name] stated to me that pt was recently on three different types of abx but she did not know if the levaquin was one pt was recently on. After explaining over and over to her the reason why CY was wanting pt to take the levaquin due to the bacteria having a sensitivity to the levaquin and that was why were going to prescribe pt that to treat the pna and bacteria that was grown.  [name] expressed understanding. I verified pt's pharmacy and called abx in for pt. Nothing further needed.

## 2017-12-07 NOTE — Progress Notes (Signed)
LMTCB 11/20

## 2017-12-20 ENCOUNTER — Telehealth: Payer: Self-pay | Admitting: Internal Medicine

## 2017-12-20 NOTE — Telephone Encounter (Signed)
Called and spoke with pt's daughter [name]. [name] stated after pt finished the levaquin, pt is still coughing up blood and it is no better.  I stated to [name] that we should get pt in for an appt with an APP to see if anything else needs to be changed with treatment due to pt still coughing up the blood which has been going on now for 8 months.   [name] stated to me that she does not want pt to see an APP, she only wants him to see Dr. Annamaria Boots. I stated to [name] that CY's first avail appt isn't until January which that is also when pt has a current appt scheduled. No matter what I said, [name] insisted that pt only see CY for an appt.  Dr. Annamaria Boots, please advise on this for pt and daughter [name] if there is any way we could schedule pt an appt soon with you due to pt still coughing up blood even after finishing levaquin, or if we cannot, please advise on recs for pt. Thanks!  No Known Allergies   Current Outpatient Medications:  .  allopurinol (ZYLOPRIM) 300 MG tablet, TAKE 1/2 TABLET DAILY TO PREVENT GOUT ATTACK, Disp: 90 tablet, Rfl: 1 .  aspirin EC 81 MG tablet, Take 1 tablet (81 mg total) by mouth daily., Disp: 90 tablet, Rfl: 3 .  carvedilol (COREG) 25 MG tablet, TAKE 1 TABLET TWICE DAILY  WITH MEALS (TO REPLACE     ATENOLOL 25MG ), Disp: 180 tablet, Rfl: 3 .  cloNIDine (CATAPRES) 0.2 MG tablet, TK 1 T PO TID, Disp: , Rfl: 5 .  ELIQUIS 5 MG TABS tablet, TAKE 1 TABLET TWICE A DAY, Disp: 180 tablet, Rfl: 1 .  furosemide (LASIX) 40 MG tablet, Take 40mg  in the morning and 20mg  in the evening., Disp: 45 tablet, Rfl: 6 .  hydrALAZINE (APRESOLINE) 50 MG tablet, Take 1 tablet (50 mg total) by mouth 2 (two) times daily., Disp: 180 tablet, Rfl: 3 .  insulin aspart (NOVOLOG) 100 UNIT/ML injection, Inject into the skin as directed. 12 UNITS AM, 8 UNITS LUNCH, AND 12 UNITS PM ALL BEFORE MEALS, Disp: , Rfl:  .  Insulin Detemir (LEVEMIR) 100 UNIT/ML Pen, Inject 24 Units into the skin daily. , Disp: ,  Rfl:  .  levofloxacin (LEVAQUIN) 750 MG tablet, Take 1 tablet (750 mg total) by mouth daily., Disp: 10 tablet, Rfl: 0 .  Multiple Vitamin (MULTIVITAMIN) tablet, Take 1 tablet by mouth daily., Disp: , Rfl:  .  Respiratory Therapy Supplies (FLUTTER) DEVI, Use as directed (Patient not taking: Reported on 11/24/2017), Disp: 1 each, Rfl: 0 .  simvastatin (ZOCOR) 20 MG tablet, Take 20 mg by mouth daily., Disp: , Rfl:  .  spironolactone (ALDACTONE) 25 MG tablet, Take 25 mg by mouth daily., Disp: , Rfl:

## 2017-12-20 NOTE — Telephone Encounter (Signed)
Please see if we can get him today

## 2017-12-20 NOTE — Telephone Encounter (Signed)
Spoke with John Romero and made an appt with CY tomorrow at 11:30. Nothing further is needed.

## 2017-12-21 ENCOUNTER — Encounter: Payer: Self-pay | Admitting: Internal Medicine

## 2017-12-21 ENCOUNTER — Ambulatory Visit (INDEPENDENT_AMBULATORY_CARE_PROVIDER_SITE_OTHER): Payer: Medicare Other | Admitting: Internal Medicine

## 2017-12-21 VITALS — BP 118/72 | HR 59 | Ht 71.0 in | Wt 203.2 lb

## 2017-12-21 DIAGNOSIS — R042 Hemoptysis: Secondary | ICD-10-CM | POA: Diagnosis not present

## 2017-12-21 NOTE — Patient Instructions (Addendum)
Order- pneumovax 23-WILL NEED TO GIVE AT NEXT OV  Order- Sputum smear and culture, routine, fungal and AFB/ fluorochrome  I will discuss bronchoscopy and blood tests for cancer cells with my associates  I will discuss with Dr Gwenlyn Found  Please call as needed

## 2017-12-21 NOTE — Progress Notes (Signed)
HPI:  Seen 08/26/17 by Dr Vaughan Browner:  Extracted notes from that visit: 84 year old with history of coronary artery disease, atrial fibrillation, hypertension, heart failure, DM Has complaints of chest congestion, clear mucus, intermittent hemoptysis for the past 6 months.  Has dyspnea with exertion.  No symptoms at rest, no wheezing, fevers, chills.  He has used Mucinex over-the-counter for this without improvement in symptoms  He had a CT scan done on 07/06/2017 which showed groundglass opacities and changes concerning for interstitial lung disease, pulmonary fibrosis.  ------------------------------------------------------------------------------------------------------  10/21/2017- Pulmonary Consult: Former Psychologist, educational.  Patient is here today with his daughter.  I take care of his sister for COPD.  I suspect she prompted the request for change of provider although he is somewhat hard of hearing and says he had trouble understanding at his first visit. He and daughter give history of persistent cough with some chest congestion, occasional wheeze and chest tightness, but little shortness of breath.  This is been going on for 6 or 8 months and has not changed with 2 antibiotics, Breo inhaler.  He has had rusty but not grossly bloody sputum and reports Dr. Berry/cardiology advised against reducing Eliquis in context of chronic atrial fibrillation. He has not gotten notably better or worse over this period of time.  He does feel somewhat worse trying to lie down flat. He denies adenopathy, fever, leg pain or swelling, other bleeding. Quit smoking at least 30 years ago.  Worked for [employer] with little occupational hazard exposure.  He says respiratory protection measures were quite good.  Prostate cancer treated around 1970 without known recurrence. I reviewed CT image from June, discussed radiology recommendation for repeat study with high-resolution, and compared the image to most recent CXR.   Left hemidiaphragm mildly elevated with splenic flexure gas and potential for some atelectasis in the left lower lung zone.  The CT suggests fairly significant increased density in the left lower lung zone-possibly atelectasis or localized edema.  There are minor peripheral basilar changes of interstitial disease that I would consider of low priority for an 84 year old man. CT chest 07/06/2017- IMPRESSION: 1. The appearance of the lungs is concerning for potential interstitial lung disease. However, in the setting of history of hemoptysis, some of these findings could be attributable to areas of alveolar hemorrhage. Outpatient referral to Pulmonology is recommended in the near future for further evaluation. Consideration for follow-up high-resolution chest CT is also suggested. 2. Cardiomegaly with left atrial dilatation. There is also a filling defect in the tip of the left atrial appendage, concerning for left atrial appendage thrombus. While this could simply reflect "pseudo thrombus" related to timing of the contrast bolus, further evaluation with transesophageal echocardiography should be considered if clinically appropriate. If present, this thrombus would place the patient at risk for systemic embolization. 3. Aortic atherosclerosis, in addition to left main and 3 vessel coronary artery disease. Status post median sternotomy for CABG including LIMA to the LAD. Echo 07/28/2017- - Normal LV systolic function; mild LVH; sclerotic aortic valve   with mild AI; mildly dilated aortic root; mild LAE, RAE and RVE.  EF 55-60%.  11/24/2017-84 year old male never smoker followed for chronic cough, ILD, hemoptysis, complicated by chronic A. fib, ASCVD/MI, CABG/chronic systolic and diastolic CHF, CVA, CKD 3, DM 2, HBP, gout, prostate cancer, No benefit from Bevespi or flutter device.  Continues to chew tobacco. Labs 10/21/2017-hemoglobin 12.9, ProBNP 269H, DDimer 0.33, Glu 393H, Creat 1.78,  His  daughter brings him today.  He  still reports scant blood-tinged sputum in the morning and sometimes a little later in the day if he feels need to clear his throat.  There is been no gross bleeding. We discussed labs, including blood sugar, renal function, mild anemia and indication of mild fluid overload.  We reviewed his chest CT image and report.  There is bibasilar fibrosis, but the left lower lobe parenchymal density without discrete mass suggests this is the source of blood.  A loose malignancy or local bleeding could look like this.  He feels this illness has been going on for months, without fever, chills or purulent sputum and despite several antibiotics which makes a bacterial infection unlikely. He does not feel badly but does want this problem resolved.  He is reluctant to have more testing and interventions and takes time to think through our discussions. CT chest High Res 10/28/2017 IMPRESSION: 1. Spectrum of findings suggestive of basilar predominant fibrotic interstitial lung disease with mild honeycombing, without significant change since 07/06/2017 chest CT. Findings are indeterminate for UIP per consensus guidelines: Diagnosis of Idiopathic Pulmonary Fibrosis: An Official ATS/ERS/JRS/ALAT Clinical Practice Guideline. Bland, Iss 5, [phone], Sep 18 2016. Differential includes fibrotic nonspecific interstitial pneumonia (NSIP) given the prominent ground-glass component. Follow-up high-resolution chest CT suggested in 12 months to assess for temporal pattern stability. 2. New patchy consolidation in the superior segment left lower lobe, suggestive of superimposed pneumonia or alveolar hemorrhage given the reported history of hemoptysis. Suggest follow-up chest CT in 3-6 months to document resolution of the new consolidation. Aortic Atherosclerosis (ICD10-I70.0) and Emphysema (ICD10-J43.9).  12/21/2017- 84 year old male never smoker followed for chronic  cough, ILD, hemoptysis, complicated by chronic A. fib, ASCVD/MI, CABG/chronic systolic and diastolic CHF, CVA, CKD 3, DM 2, HBP, gout, prostate cancer, No benefit from Bevespi or flutter device.  Continues to chew tobacco -----Hemoptysis: Pt continues to cough up blood even after completing Levaquin abx. Started out pink in color and then went to dark red color.  Flutter device, Eliquis, Sputum cultures 11/25/2017+ type Klebsiella, positive Proteus, incidental + mold Geotrichum, - AFB Sputum cytology-negative but only looked like mouth sample. We had called in Levaquin based on sensitivities of sputum bacterial culture Sputum sample in a plastic bag, bright red and black bloody mixed in clear/yellow mucus CXR 11/24/2017 IMPRESSION: Hazy and reticular opacities at both lung bases, with the hazy component at the left lung base apparently increased compared to the recent chest CT. Findings may indicate interval alveolar hemorrhage or pneumonia at the left lung base superimposed on chronic basilar predominant fibrotic interstitial lung disease. Continued chest imaging follow-up advised.   ROS-see HPI   + = positive Constitutional:    weight loss, night sweats, fevers, chills, fatigue, lassitude,  HEENT:    headaches, difficulty swallowing, tooth/dental problems, sore throat,       sneezing, itching, ear ache, nasal congestion, post nasal drip, snoring CV:    chest pain, orthopnea, PND, swelling in lower extremities, anasarca,                                                          dizziness, palpitations Resp:   shortness of breath with exertion or at rest.                +productive  cough,   non-productive cough, +coughing up of blood.              +change in color of mucus.  wheezing.   Skin:    rash or lesions. GI:  No-   heartburn, indigestion, abdominal pain, nausea, vomiting, diarrhea,                 change in bowel habits, loss of appetite GU: dysuria, change in color of urine, no  urgency or frequency.   flank pain. MS:   joint pain, stiffness, decreased range of motion, back pain. Neuro-     nothing unusual Psych:  change in mood or affect.  depression or anxiety.   memory loss.  OBJ- Physical Exam General- Alert, Oriented, Affect-appropriate, Distress- none acute, + wheelchair Skin- rash-none, lesions- none, excoriation- none Lymphadenopathy- none Head- atraumatic            Eyes- Gross vision intact, PERRLA, conjunctivae and secretions clear            Ears- Hearing, canals-normal            Nose- Clear, no-Septal dev, mucus, polyps, erosion, perforation             Throat- Mallampati II , mucosa clear , drainage- none, tonsils- atrophic Neck- flexible , trachea midline, no stridor , thyroid nl, carotid no bruit Chest - symmetrical excursion , unlabored           Heart/CV- +IRR/ AFib , no murmur , no gallop  , no rub, nl s1 s2                           - JVD- none , edema+1, stasis changes- none, varices- none           Lung- + coarse bibasilar crackles especially left base, wheeze- none, cough- none , dullness+ L                           base, rub- none           Chest wall-  Abd-  Br/ Gen/ Rectal- Not done, not indicated Extrem- cyanosis- none, clubbing, none, atrophy- none, strength- nl Neuro-+ interaction and response to questions is a little slow as if he is considering, or may be from decreased hearing.

## 2017-12-21 NOTE — Assessment & Plan Note (Signed)
Likely coming from left lower lobe process which remains dense with some atelectasis but no discrete mass denies pain, fever, adenopathy is not really noticed shortness of breath.  Admits easy fatigue.  Heme is intermittent this did now at least 3 months.  I have talked with him about bronchoscopy for direct evaluation and sampling.  Potential benefits, commonly associated risks and complications.  I will contact Dr. Gwenlyn Found holding Eliquis for a few days for procedure Mr. Cope forward with this, we will follow with x-ray.  I doubt the sputum culture for Geotrichum sp is significant, but we will recheck sputum.  A fungal pneumonia or a persistent aspiration pneumonia with some foreign body occlusion of airway might look like this, especially if there is bleeding on Eliquis into that part of his lung. Plan-I will discuss with Dr. Gwenlyn Found, reculture sputum after Levaquin, I will discuss possible bronchoscopy with colleagues.

## 2017-12-22 ENCOUNTER — Other Ambulatory Visit: Payer: Medicare Other

## 2017-12-22 ENCOUNTER — Ambulatory Visit: Payer: Medicare Other | Admitting: Internal Medicine

## 2017-12-22 DIAGNOSIS — R042 Hemoptysis: Secondary | ICD-10-CM | POA: Diagnosis not present

## 2017-12-26 ENCOUNTER — Other Ambulatory Visit: Payer: Self-pay | Admitting: Cardiovascular Disease

## 2017-12-28 NOTE — Telephone Encounter (Signed)
Rx request sent to pharmacy.  

## 2018-01-04 ENCOUNTER — Telehealth: Payer: Self-pay | Admitting: Internal Medicine

## 2018-01-04 NOTE — Telephone Encounter (Signed)
Dr. Annamaria Boots, pt is requesting sputum culture results. Please advise.

## 2018-01-09 NOTE — Telephone Encounter (Signed)
Patient had requested results.  Sputum results still not available at this time.

## 2018-01-10 LAB — MYCOBACTERIA,CULT W/FLUOROCHROME SMEAR
MICRO NUMBER: 91348261
SMEAR:: NONE SEEN
SPECIMEN QUALITY: ADEQUATE

## 2018-01-10 LAB — RESPIRATORY CULTURE OR RESPIRATORY AND SPUTUM CULTURE
MICRO NUMBER:: 91348262
SPECIMEN QUALITY:: ADEQUATE

## 2018-01-10 LAB — FUNGUS CULTURE W SMEAR
MICRO NUMBER:: 91348260
SPECIMEN QUALITY: ADEQUATE

## 2018-01-10 NOTE — Telephone Encounter (Signed)
Left message for patient's wife that Sputum cultures can take about 4-6 weeks to be completed. Will forward to CY as friendly reminder that patient is waiting on results.

## 2018-01-12 ENCOUNTER — Telehealth: Payer: Self-pay | Admitting: Internal Medicine

## 2018-01-12 NOTE — Telephone Encounter (Signed)
Dulcolax 1-2 tabs by mouth once daily for constipation (over the counter) Follow up with primary care if not better

## 2018-01-12 NOTE — Telephone Encounter (Signed)
Pt's wife is calling back [phone]  Please leave a detailed message.

## 2018-01-12 NOTE — Telephone Encounter (Signed)
ATC unable to leave a message will call back.

## 2018-01-12 NOTE — Telephone Encounter (Signed)
Called and spoke with pt's wife John Romero who states pt has been getting constipated, taking miralax which is not working.  Per John Romero, pt has had problems with the constipation x10 days now and has been taking miralax since. Per John Romero, pt is going 4-5 days without having a BM. Pt got on the phone and stated that he has no urge to have a BM.  Pt and wife John Romero wants to know if there is something else pt can take other than miralax to help with constipation since it is not helping. Beth, please advise on recs for pt. Thanks!

## 2018-01-12 NOTE — Telephone Encounter (Signed)
LMOM

## 2018-01-12 NOTE — Telephone Encounter (Signed)
Attempted to call pt's wife [name] but unable to reach her. Left message for shirley to return call.

## 2018-01-12 NOTE — Telephone Encounter (Signed)
Left detailed message on voicemail as requested of recs. Nothing more needed at this time.

## 2018-01-12 NOTE — Telephone Encounter (Signed)
CY is out of the office today, will review results at that time.

## 2018-01-16 NOTE — Telephone Encounter (Signed)
Patient and Patient's Wife, John Romero, aware that results take several weeks to come back. Nothing further at this time.

## 2018-01-24 ENCOUNTER — Encounter: Payer: Self-pay | Admitting: Internal Medicine

## 2018-01-24 ENCOUNTER — Ambulatory Visit (INDEPENDENT_AMBULATORY_CARE_PROVIDER_SITE_OTHER): Payer: Medicare Other | Admitting: Internal Medicine

## 2018-01-24 VITALS — BP 112/60 | HR 68 | Ht 71.0 in | Wt 209.0 lb

## 2018-01-24 DIAGNOSIS — Z23 Encounter for immunization: Secondary | ICD-10-CM | POA: Diagnosis not present

## 2018-01-24 DIAGNOSIS — R600 Localized edema: Secondary | ICD-10-CM | POA: Diagnosis not present

## 2018-01-24 DIAGNOSIS — R042 Hemoptysis: Secondary | ICD-10-CM

## 2018-01-24 NOTE — Progress Notes (Signed)
HPI:  Seen 08/26/17 by Dr Vaughan Browner:  Extracted notes from that visit: 85 year old with history of coronary artery disease, atrial fibrillation, hypertension, heart failure, DM Has complaints of chest congestion, clear mucus, intermittent hemoptysis for the past 6 months.  Has dyspnea with exertion.  No symptoms at rest, no wheezing, fevers, chills.  He has used Mucinex over-the-counter for this without improvement in symptoms  He had a CT scan done on 07/06/2017 which showed groundglass opacities and changes concerning for interstitial lung disease, pulmonary fibrosis. Sputum cultures 11/25/2017+ type Klebsiella, positive Proteus, incidental + mold Geotrichum, - AFB Sputum cultures 12/22/2017-normal bacterial flora, negative AFB smear with culture pending, yeast. ------------------------------------------------------------------------------------------------------  10/21/2017- Pulmonary Consult: Former Psychologist, educational.  Patient is here today with his daughter.  I take care of his sister for COPD.  I suspect she prompted the request for change of provider although he is somewhat hard of hearing and says he had trouble understanding at his first visit. He and daughter give history of persistent cough with some chest congestion, occasional wheeze and chest tightness, but little shortness of breath.  This is been going on for 6 or 8 months and has not changed with 2 antibiotics, Breo inhaler.  He has had rusty but not grossly bloody sputum and reports Dr. Berry/cardiology advised against reducing Eliquis in context of chronic atrial fibrillation. He has not gotten notably better or worse over this period of time.  He does feel somewhat worse trying to lie down flat. He denies adenopathy, fever, leg pain or swelling, other bleeding. Quit smoking at least 30 years ago.  Worked for [employer] with little occupational hazard exposure.  He says respiratory protection measures were quite good.  Prostate cancer  treated around 1970 without known recurrence. I reviewed CT image from June, discussed radiology recommendation for repeat study with high-resolution, and compared the image to most recent CXR.  Left hemidiaphragm mildly elevated with splenic flexure gas and potential for some atelectasis in the left lower lung zone.  The CT suggests fairly significant increased density in the left lower lung zone-possibly atelectasis or localized edema.  There are minor peripheral basilar changes of interstitial disease that I would consider of low priority for an 85 year old man. CT chest 07/06/2017- IMPRESSION: 1. The appearance of the lungs is concerning for potential interstitial lung disease. However, in the setting of history of hemoptysis, some of these findings could be attributable to areas of alveolar hemorrhage. Outpatient referral to Pulmonology is recommended in the near future for further evaluation. Consideration for follow-up high-resolution chest CT is also suggested. 2. Cardiomegaly with left atrial dilatation. There is also a filling defect in the tip of the left atrial appendage, concerning for left atrial appendage thrombus. While this could simply reflect "pseudo thrombus" related to timing of the contrast bolus, further evaluation with transesophageal echocardiography should be considered if clinically appropriate. If present, this thrombus would place the patient at risk for systemic embolization. 3. Aortic atherosclerosis, in addition to left main and 3 vessel coronary artery disease. Status post median sternotomy for CABG including LIMA to the LAD. Echo 07/28/2017- - Normal LV systolic function; mild LVH; sclerotic aortic valve   with mild AI; mildly dilated aortic root; mild LAE, RAE and RVE.  EF 55-60%.  11/24/2017-85 year old male never smoker followed for chronic cough, ILD, hemoptysis, complicated by chronic A. fib, ASCVD/MI, CABG/chronic systolic and diastolic CHF, CVA, CKD 3, DM  2, HBP, gout, prostate cancer, No benefit from Bevespi or flutter device.  Continues to chew tobacco. Labs 10/21/2017-hemoglobin 12.9, ProBNP 269H, DDimer 0.33, Glu 393H, Creat 1.78,  His daughter brings him today.  He still reports scant blood-tinged sputum in the morning and sometimes a little later in the day if he feels need to clear his throat.  There is been no gross bleeding. We discussed labs, including blood sugar, renal function, mild anemia and indication of mild fluid overload.  We reviewed his chest CT image and report.  There is bibasilar fibrosis, but the left lower lobe parenchymal density without discrete mass suggests this is the source of blood.  A loose malignancy or local bleeding could look like this.  He feels this illness has been going on for months, without fever, chills or purulent sputum and despite several antibiotics which makes a bacterial infection unlikely. He does not feel badly but does want this problem resolved.  He is reluctant to have more testing and interventions and takes time to think through our discussions. CT chest High Res 10/28/2017 IMPRESSION: 1. Spectrum of findings suggestive of basilar predominant fibrotic interstitial lung disease with mild honeycombing, without significant change since 07/06/2017 chest CT. Findings are indeterminate for UIP per consensus guidelines: Diagnosis of Idiopathic Pulmonary Fibrosis: An Official ATS/ERS/JRS/ALAT Clinical Practice Guideline. [place], Iss 5, [phone], Sep 18 2016. Differential includes fibrotic nonspecific interstitial pneumonia (NSIP) given the prominent ground-glass component. Follow-up high-resolution chest CT suggested in 12 months to assess for temporal pattern stability. 2. New patchy consolidation in the superior segment left lower lobe, suggestive of superimposed pneumonia or alveolar hemorrhage given the reported history of hemoptysis. Suggest follow-up chest CT in 3-6  months to document resolution of the new consolidation. Aortic Atherosclerosis (ICD10-I70.0) and Emphysema (ICD10-J43.9).  12/21/2017- 85 year old male never smoker followed for chronic cough, ILD, hemoptysis, complicated by chronic A. fib, ASCVD/MI, CABG/chronic systolic and diastolic CHF, CVA, CKD 3, DM 2, HBP, gout, prostate cancer, No benefit from Bevespi or flutter device.  Continues to chew tobacco -----Hemoptysis: Pt continues to cough up blood even after completing Levaquin abx. Started out pink in color and then went to dark red color.  Flutter device, Eliquis, Sputum cultures 11/25/2017+ type Klebsiella, positive Proteus, incidental + mold Geotrichum, - AFB Sputum cytology-negative but only looked like mouth sample. We had called in Levaquin based on sensitivities of sputum bacterial culture Sputum sample in a plastic bag, bright red and black bloody mixed in clear/yellow mucus CXR 11/24/2017 IMPRESSION: Hazy and reticular opacities at both lung bases, with the hazy component at the left lung base apparently increased compared to the recent chest CT. Findings may indicate interval alveolar hemorrhage or pneumonia at the left lung base superimposed on chronic basilar predominant fibrotic interstitial lung disease. Continued chest imaging follow-up advised.  01/24/2018- 85 year old male never smoker followed for chronic cough, ILD, hemoptysis, complicated by chronic A. fib, ASCVD/MI, CABG/chronic systolic and diastolic CHF, CVA, CKD 3, DM 2, HBP, gout, prostate cancer, Sputum cultures 12/22/2017-normal bacterial flora, negative AFB smear with culture pending, yeast. He comes again with his daughter, today reporting that he feels "fine".  He minimizes cough and says there is been no blood in 2 weeks.  He does complain of "no energy" and is not very active.  Legs remain swollen.  Denies chest pain, fever, discolored sputum.  He notices very little cough or choking when swallowing food or  drink. GNRs from previous culture cleared after levaquin. I had contacted Dr. Market researcher, who gives permission to reduce or  hold Eliquis for a few days if needed to control hemoptysis.  ROS-see HPI   + = positive Constitutional:    weight loss, night sweats, fevers, chills, fatigue,+ lassitude,  HEENT:    headaches, difficulty swallowing, tooth/dental problems, sore throat,       sneezing, itching, ear ache, nasal congestion, post nasal drip, snoring CV:    chest pain, orthopnea, PND, swelling in lower extremities, anasarca,                                                        dizziness, palpitations Resp:   shortness of breath with exertion or at rest.                +productive cough,   non-productive cough, coughing up of blood.              change in color of mucus.  wheezing.   Skin:    +scaling stasis dermatitis lower legs GI:  No-   heartburn, indigestion, abdominal pain, nausea, vomiting, diarrhea,                 change in bowel habits, loss of appetite GU: dysuria, change in color of urine, no urgency or frequency.   flank pain. MS:   joint pain, stiffness, decreased range of motion, back pain. Neuro-     nothing unusual Psych:  change in mood or affect.  depression or anxiety.   memory loss.  OBJ- Physical Exam General- Alert, Oriented, Affect-appropriate, Distress- none acute, + wheelchair Skin-+scaling dermatitis lower legs Lymphadenopathy- none Head- atraumatic            Eyes- Gross vision intact, PERRLA, conjunctivae and secretions clear            Ears- Hard of hearing            Nose- Clear, no-Septal dev, mucus, polyps, erosion, perforation             Throat- Mallampati II , mucosa clear , drainage- none, tonsils- atrophic Neck- flexible , trachea midline, no stridor , thyroid nl, carotid no bruit Chest - symmetrical excursion , unlabored           Heart/CV- +IRR/ AFib , no murmur , no gallop  , no rub, nl s1 s2                           - JVD- none ,  edema+2-3, stasis changes+, varices- none           Lung- +  crackles especially left base, wheeze- none, cough- none , dullness-none                            rub- none           Chest wall-  Abd-  Br/ Gen/ Rectal- Not done, not indicated Extrem- cyanosis- none, clubbing, none, atrophy- none, strength- nl Neuro-+ interaction and response to questions is a little slow as if he is considering, or may be from decreased hearing.

## 2018-01-24 NOTE — Patient Instructions (Addendum)
Order- Prevnar 56    Im glad you are doing better. We will follow along for now.  Please call if we can help

## 2018-01-24 NOTE — Assessment & Plan Note (Signed)
I think we are probably dealing with a combination of indolent interstitial lung disease (possibly NSIP?)  And superimposed pneumonia (possibly recurrent aspiration?).  Now much improved after Levaquin and most recent culture is negative. We question if some of the left lower lobe density could be parenchymal hemorrhage and I was prepared to reduce or hold Eliquis for 3 or 4 days.  Since he reports no hemoptysis in 2 weeks, we will not change anything. He says he is feeling much better once to minimize appointments.  We are making a follow-up appointment in 3 months which he can cancel if he does not feel he needs it.

## 2018-01-24 NOTE — Assessment & Plan Note (Signed)
His daughter is concerned about scaling on his edematous lower legs.  We also note what appears to be clot on a superficial ulcer right pretibial area.  These can be evaluated by Dr. Felipa Eth at upcoming primary care follow-up next week.

## 2018-01-30 ENCOUNTER — Ambulatory Visit: Payer: Medicare Other | Admitting: Podiatry

## 2018-02-01 ENCOUNTER — Telehealth: Payer: Self-pay | Admitting: Internal Medicine

## 2018-02-01 DIAGNOSIS — J8410 Pulmonary fibrosis, unspecified: Secondary | ICD-10-CM | POA: Diagnosis not present

## 2018-02-01 DIAGNOSIS — N183 Chronic kidney disease, stage 3 (moderate): Secondary | ICD-10-CM | POA: Diagnosis not present

## 2018-02-01 DIAGNOSIS — E1140 Type 2 diabetes mellitus with diabetic neuropathy, unspecified: Secondary | ICD-10-CM | POA: Diagnosis not present

## 2018-02-01 DIAGNOSIS — I5020 Unspecified systolic (congestive) heart failure: Secondary | ICD-10-CM | POA: Diagnosis not present

## 2018-02-01 DIAGNOSIS — I480 Paroxysmal atrial fibrillation: Secondary | ICD-10-CM | POA: Diagnosis not present

## 2018-02-01 DIAGNOSIS — E1121 Type 2 diabetes mellitus with diabetic nephropathy: Secondary | ICD-10-CM | POA: Diagnosis not present

## 2018-02-01 DIAGNOSIS — I700 Atherosclerosis of aorta: Secondary | ICD-10-CM | POA: Diagnosis not present

## 2018-02-01 DIAGNOSIS — Z79899 Other long term (current) drug therapy: Secondary | ICD-10-CM | POA: Diagnosis not present

## 2018-02-01 DIAGNOSIS — I129 Hypertensive chronic kidney disease with stage 1 through stage 4 chronic kidney disease, or unspecified chronic kidney disease: Secondary | ICD-10-CM | POA: Diagnosis not present

## 2018-02-01 NOTE — Telephone Encounter (Signed)
Attempted to call [name] who is patient's daughter and listed on the DPR, no answer, left message to call back if she would like to speak with me.  I was advised by Kathlee Nations that the daughter wanted to talk specifically with Dr. Vaughan Browner whom the patient saw back in August. Patient switched providers after that visit and has been seeing Dr. Annamaria Boots since.  The daughter was not happy about the bill for the initial consult. Kathlee Nations attempted to explain that all of our providers bill the same for the same levels of service and that it wasn't just Dr. Vaughan Browner picking an amount.  Daughter was not happy and wanted to speak to PM directly. I attempted to call since PM will not be calling her directly and planned on relaying the same information that Kathlee Nations had.

## 2018-02-06 LAB — FUNGUS CULTURE W SMEAR
MICRO NUMBER:: 91458112
SMEAR:: NONE SEEN
SPECIMEN QUALITY:: ADEQUATE

## 2018-02-06 LAB — MYCOBACTERIA,CULT W/FLUOROCHROME SMEAR
MICRO NUMBER:: 91458113
SMEAR:: NONE SEEN
SPECIMEN QUALITY:: ADEQUATE

## 2018-02-06 LAB — RESPIRATORY CULTURE OR RESPIRATORY AND SPUTUM CULTURE
MICRO NUMBER:: 91458114
RESULT:: NORMAL
SPECIMEN QUALITY:: ADEQUATE

## 2018-02-08 ENCOUNTER — Ambulatory Visit: Payer: Medicare Other | Admitting: Internal Medicine

## 2018-02-27 ENCOUNTER — Other Ambulatory Visit: Payer: Self-pay

## 2018-02-27 MED ORDER — APIXABAN 5 MG PO TABS: 5.0000 mg | ORAL_TABLET | Freq: Two times a day (BID) | ORAL | 0 refills | Status: DC

## 2018-03-23 ENCOUNTER — Ambulatory Visit (INDEPENDENT_AMBULATORY_CARE_PROVIDER_SITE_OTHER): Payer: Medicare Other | Admitting: Podiatry

## 2018-03-23 DIAGNOSIS — M79675 Pain in left toe(s): Secondary | ICD-10-CM | POA: Diagnosis not present

## 2018-03-23 DIAGNOSIS — E1140 Type 2 diabetes mellitus with diabetic neuropathy, unspecified: Secondary | ICD-10-CM | POA: Insufficient documentation

## 2018-03-23 DIAGNOSIS — I872 Venous insufficiency (chronic) (peripheral): Secondary | ICD-10-CM | POA: Insufficient documentation

## 2018-03-23 DIAGNOSIS — E1142 Type 2 diabetes mellitus with diabetic polyneuropathy: Secondary | ICD-10-CM

## 2018-03-23 DIAGNOSIS — L97512 Non-pressure chronic ulcer of other part of right foot with fat layer exposed: Secondary | ICD-10-CM | POA: Diagnosis not present

## 2018-03-23 DIAGNOSIS — M79674 Pain in right toe(s): Secondary | ICD-10-CM | POA: Diagnosis not present

## 2018-03-23 DIAGNOSIS — B351 Tinea unguium: Secondary | ICD-10-CM | POA: Diagnosis not present

## 2018-03-23 DIAGNOSIS — E11621 Type 2 diabetes mellitus with foot ulcer: Secondary | ICD-10-CM | POA: Diagnosis not present

## 2018-03-23 NOTE — Patient Instructions (Addendum)
Apply Iodosorb and a bandage daily   Onychomycosis/Fungal Toenails  WHAT IS IT? An infection that lies within the keratin of your nail plate that is caused by a fungus.  WHY ME? Fungal infections affect all ages, sexes, races, and creeds.  There may be many factors that predispose you to a fungal infection such as age, coexisting medical conditions such as diabetes, or an autoimmune disease; stress, medications, fatigue, genetics, etc.  Bottom line: fungus thrives in a warm, moist environment and your shoes offer such a location.  IS IT CONTAGIOUS? Theoretically, yes.  You do not want to share shoes, nail clippers or files with someone who has fungal toenails.  Walking around barefoot in the same room or sleeping in the same bed is unlikely to transfer the organism.  It is important to realize, however, that fungus can spread easily from one nail to the next on the same foot.  HOW DO WE TREAT THIS?  There are several ways to treat this condition.  Treatment may depend on many factors such as age, medications, pregnancy, liver and kidney conditions, etc.  It is best to ask your doctor which options are available to you.  1. No treatment.   Unlike many other medical concerns, you can live with this condition.  However for many people this can be a painful condition and may lead to ingrown toenails or a bacterial infection.  It is recommended that you keep the nails cut short to help reduce the amount of fungal nail. 2. Topical treatment.  These range from herbal remedies to prescription strength nail lacquers.  About 40-50% effective, topicals require twice daily application for approximately 9 to 12 months or until an entirely new nail has grown out.  The most effective topicals are medical grade medications available through physicians offices. 3. Oral antifungal medications.  With an 80-90% cure rate, the most common oral medication requires 3 to 4 months of therapy and stays in your system for a  year as the new nail grows out.  Oral antifungal medications do require blood work to make sure it is a safe drug for you.  A liver function panel will be performed prior to starting the medication and after the first month of treatment.  It is important to have the blood work performed to avoid any harmful side effects.  In general, this medication safe but blood work is required. 4. Laser Therapy.  This treatment is performed by applying a specialized laser to the affected nail plate.  This therapy is noninvasive, fast, and non-painful.  It is not covered by insurance and is therefore, out of pocket.  The results have been very good with a 80-95% cure rate.  The [facility] is the only practice in the area to offer this therapy. 5. Permanent Nail Avulsion.  Removing the entire nail so that a new nail will not grow back.

## 2018-04-03 ENCOUNTER — Encounter: Payer: Self-pay | Admitting: Podiatry

## 2018-04-03 NOTE — Progress Notes (Signed)
Subjective: John Romero presents with diabetes and diabetic neuropathy for preventative foot care with cc of painful, discolored, thick toenails which interferes with activities of daily living. Pain is aggravated when wearing enclosed shoe gear. Pain is relieved with periodic professional debridement.  Last Podiatry visit July 2019.  John Manes, MD is his PCP.   Current Outpatient Medications:  .  allopurinol (ZYLOPRIM) 300 MG tablet, TAKE 1/2 TABLET DAILY TO PREVENT GOUT ATTACK, Disp: 90 tablet, Rfl: 1 .  apixaban (ELIQUIS) 5 MG TABS tablet, Take 1 tablet (5 mg total) by mouth 2 (two) times daily., Disp: 180 tablet, Rfl: 0 .  aspirin EC 81 MG tablet, Take 1 tablet (81 mg total) by mouth daily., Disp: 90 tablet, Rfl: 3 .  carvedilol (COREG) 25 MG tablet, TAKE 1 TABLET TWICE DAILY  WITH MEALS (TO REPLACE     ATENOLOL 25MG ), Disp: 180 tablet, Rfl: 3 .  cloNIDine (CATAPRES) 0.2 MG tablet, TK 1 T PO TID, Disp: , Rfl: 5 .  furosemide (LASIX) 40 MG tablet, Take 40mg  in the morning and 20mg  in the evening., Disp: 45 tablet, Rfl: 6 .  hydrALAZINE (APRESOLINE) 50 MG tablet, Take 1 tablet (50 mg total) by mouth 2 (two) times daily., Disp: 180 tablet, Rfl: 3 .  insulin aspart (NOVOLOG) 100 UNIT/ML injection, Inject into the skin as directed. 12 UNITS AM, 8 UNITS LUNCH, AND 12 UNITS PM ALL BEFORE MEALS, Disp: , Rfl:  .  Insulin Detemir (LEVEMIR) 100 UNIT/ML Pen, Inject 24 Units into the skin daily. , Disp: , Rfl:  .  Multiple Vitamin (MULTIVITAMIN) tablet, Take 1 tablet by mouth daily., Disp: , Rfl:  .  Respiratory Therapy Supplies (FLUTTER) DEVI, Use as directed, Disp: 1 each, Rfl: 0 .  simvastatin (ZOCOR) 20 MG tablet, Take 20 mg by mouth daily., Disp: , Rfl:  .  spironolactone (ALDACTONE) 25 MG tablet, Take 25 mg by mouth daily., Disp: , Rfl:   No Known Allergies  Vascular Examination: Capillary refill time <3 seconds x 10 digits.  Dorsalis pedis and Posterior tibial pulses present  b/l.  No digital hair x 10 digits.  Skin temperature WNL b/l.  Dermatological Examination: Skin with normal turgor, texture and tone b/l.  Toenails 1-5 b/l discolored, thick, dystrophic with subungual debris and pain with palpation to nailbeds due to thickness of nails.  Hyperkeratotic lesion distal tip right 2nd digit. No erythema, no edema. +Flocculence noted. Predebridement, lesion measures 0.5 x 0.5 cm. Postdebridement, there is an underlying ulceration meausuring 0.4 x 0.3 x 0.1 cm with pink granular base. No purulence, no cellulitis, no bone exposure.  Musculoskeletal: Muscle strength 5/5 to all LE muscle groups  Neurological: Sensation intact  with 10 gram monofilament.  Vibratory sensation intact.  Assessment: 1. Painful onychomycosis toenails 1-5 b/l 2. Diabetic ulcer right 2nd digit, noninfected. 3. NIDDM with Diabetic neuropathy  Plan: 1. Continue diabetic foot care principles.  2. Toenails 1-5 b/l were debrided in length and girth without iatrogenic bleeding. 3. Ulcer was debrided and reactive hyperkeratoses and necrotic tissue was resected to the level of bleeding or viable tissue. Ulcer was cleansed with wound cleanser. Iodosorb Gel was applied to base of wound with light dressing. 4. Patient was given instructions on offloading and dressing change/aftercare and was instructed to call immediately if any signs or symptoms of infection arise.  5. Patient is to follow up 2 weeks. 6. Patient/POA instructed to report to emergency department with worsening appearance of ulcer/toe/foot, increased pain, foul odor,  increased redness, swelling, drainage, fever, chills, nightsweats, nausea, vomiting, increased blood sugar. Patient/POA related understanding.

## 2018-04-06 ENCOUNTER — Telehealth: Payer: Self-pay | Admitting: *Deleted

## 2018-04-06 ENCOUNTER — Ambulatory Visit: Payer: Medicare Other | Admitting: Podiatry

## 2018-04-06 NOTE — Telephone Encounter (Addendum)
I called pt, and explained that Dr. Elisha Ponder had noticed he had cancelled his appt today and she wanted to make sure he was well and the right 2nd toe was okay. Pt states the toe is doing well, and he appreciates the fine job she did, and would call if there were changes, but would see her in June.

## 2018-04-19 ENCOUNTER — Encounter: Payer: Self-pay | Admitting: Internal Medicine

## 2018-05-08 ENCOUNTER — Telehealth: Payer: Self-pay

## 2018-05-08 MED ORDER — APIXABAN 2.5 MG PO TABS: 2.5000 mg | ORAL_TABLET | Freq: Two times a day (BID) | ORAL | 1 refills | Status: DC

## 2018-05-08 NOTE — Telephone Encounter (Signed)
85yo male Afib (CKD) Scr = 1.78 (10/2017) Wt = 94.8kg  *Eliquis dose decreased to 2.5mg  BID*

## 2018-05-09 ENCOUNTER — Ambulatory Visit: Payer: Medicare Other | Admitting: Internal Medicine

## 2018-05-10 ENCOUNTER — Telehealth: Payer: Self-pay

## 2018-05-10 NOTE — Telephone Encounter (Signed)
Rx sent 05/08/2018

## 2018-05-22 ENCOUNTER — Telehealth: Payer: Self-pay | Admitting: Cardiovascular Disease

## 2018-05-22 NOTE — Telephone Encounter (Signed)
Talked to daughter and explained dose change.   She understands and agrees with change.

## 2018-05-22 NOTE — Telephone Encounter (Signed)
New message:   Patient daughter calling concerning why the doctor reduced his Eliquis. Please call his daughter.

## 2018-05-22 NOTE — Telephone Encounter (Signed)
Spoke with pt's daughter and explained protocol based on weight , age , and cr the reason Eliquis was decreased .Pt's daughter insists on  phone call for further explanation. Will forward to Raquel PHarmD to call daughter at [facility]

## 2018-06-01 ENCOUNTER — Telehealth: Payer: Self-pay | Admitting: Cardiology

## 2018-06-01 NOTE — Telephone Encounter (Signed)
My Chart message sent

## 2018-06-01 NOTE — Telephone Encounter (Signed)
LVM for patient to call and schedule 6 month followup with [name].

## 2018-06-13 DIAGNOSIS — I480 Paroxysmal atrial fibrillation: Secondary | ICD-10-CM | POA: Diagnosis not present

## 2018-06-13 DIAGNOSIS — I5020 Unspecified systolic (congestive) heart failure: Secondary | ICD-10-CM | POA: Diagnosis not present

## 2018-06-13 DIAGNOSIS — I129 Hypertensive chronic kidney disease with stage 1 through stage 4 chronic kidney disease, or unspecified chronic kidney disease: Secondary | ICD-10-CM | POA: Diagnosis not present

## 2018-06-13 DIAGNOSIS — Z79899 Other long term (current) drug therapy: Secondary | ICD-10-CM | POA: Diagnosis not present

## 2018-06-13 DIAGNOSIS — N183 Chronic kidney disease, stage 3 (moderate): Secondary | ICD-10-CM | POA: Diagnosis not present

## 2018-06-13 DIAGNOSIS — Z794 Long term (current) use of insulin: Secondary | ICD-10-CM | POA: Diagnosis not present

## 2018-06-13 DIAGNOSIS — D539 Nutritional anemia, unspecified: Secondary | ICD-10-CM | POA: Diagnosis not present

## 2018-06-13 DIAGNOSIS — E1140 Type 2 diabetes mellitus with diabetic neuropathy, unspecified: Secondary | ICD-10-CM | POA: Diagnosis not present

## 2018-06-19 ENCOUNTER — Ambulatory Visit (INDEPENDENT_AMBULATORY_CARE_PROVIDER_SITE_OTHER): Payer: Medicare Other | Admitting: Podiatry

## 2018-06-19 ENCOUNTER — Encounter: Payer: Self-pay | Admitting: Podiatry

## 2018-06-19 ENCOUNTER — Other Ambulatory Visit: Payer: Self-pay

## 2018-06-19 VITALS — Temp 98.1°F

## 2018-06-19 DIAGNOSIS — M79674 Pain in right toe(s): Secondary | ICD-10-CM | POA: Diagnosis not present

## 2018-06-19 DIAGNOSIS — L84 Corns and callosities: Secondary | ICD-10-CM

## 2018-06-19 DIAGNOSIS — B351 Tinea unguium: Secondary | ICD-10-CM

## 2018-06-19 DIAGNOSIS — S99822A Other specified injuries of left foot, initial encounter: Secondary | ICD-10-CM

## 2018-06-19 DIAGNOSIS — E1142 Type 2 diabetes mellitus with diabetic polyneuropathy: Secondary | ICD-10-CM | POA: Diagnosis not present

## 2018-06-19 DIAGNOSIS — M79675 Pain in left toe(s): Secondary | ICD-10-CM | POA: Diagnosis not present

## 2018-06-19 NOTE — Patient Instructions (Signed)
Diabetes Mellitus and Foot Care Foot care is an important part of your health, especially when you have diabetes. Diabetes may cause you to have problems because of poor blood flow (circulation) to your feet and legs, which can cause your skin to:  Become thinner and drier.  Break more easily.  Heal more slowly.  Peel and crack. You may also have nerve damage (neuropathy) in your legs and feet, causing decreased feeling in them. This means that you may not notice minor injuries to your feet that could lead to more serious problems. Noticing and addressing any potential problems early is the best way to prevent future foot problems. How to care for your feet Foot hygiene  Wash your feet daily with warm water and mild soap. Do not use hot water. Then, pat your feet and the areas between your toes until they are completely dry. Do not soak your feet as this can dry your skin.  Trim your toenails straight across. Do not dig under them or around the cuticle. File the edges of your nails with an emery board or nail file.  Apply a moisturizing lotion or petroleum jelly to the skin on your feet and to dry, brittle toenails. Use lotion that does not contain alcohol and is unscented. Do not apply lotion between your toes. Shoes and socks  Wear clean socks or stockings every day. Make sure they are not too tight. Do not wear knee-high stockings since they may decrease blood flow to your legs.  Wear shoes that fit properly and have enough cushioning. Always look in your shoes before you put them on to be sure there are no objects inside.  To break in new shoes, wear them for just a few hours a day. This prevents injuries on your feet. Wounds, scrapes, corns, and calluses  Check your feet daily for blisters, cuts, bruises, sores, and redness. If you cannot see the bottom of your feet, use a mirror or ask someone for help.  Do not cut corns or calluses or try to remove them with medicine.  If you  find a minor scrape, cut, or break in the skin on your feet, keep it and the skin around it clean and dry. You may clean these areas with mild soap and water. Do not clean the area with peroxide, alcohol, or iodine.  If you have a wound, scrape, corn, or callus on your foot, look at it several times a day to make sure it is healing and not infected. Check for: ? Redness, swelling, or pain. ? Fluid or blood. ? Warmth. ? Pus or a bad smell. General instructions  Do not cross your legs. This may decrease blood flow to your feet.  Do not use heating pads or hot water bottles on your feet. They may burn your skin. If you have lost feeling in your feet or legs, you may not know this is happening until it is too late.  Protect your feet from hot and cold by wearing shoes, such as at the beach or on hot pavement.  Schedule a complete foot exam at least once a year (annually) or more often if you have foot problems. If you have foot problems, report any cuts, sores, or bruises to your health care provider immediately. Contact a health care provider if:  You have a medical condition that increases your risk of infection and you have any cuts, sores, or bruises on your feet.  You have an injury that is not   healing.  You have redness on your legs or feet.  You feel burning or tingling in your legs or feet.  You have pain or cramps in your legs and feet.  Your legs or feet are numb.  Your feet always feel cold.  You have pain around a toenail. Get help right away if:  You have a wound, scrape, corn, or callus on your foot and: ? You have pain, swelling, or redness that gets worse. ? You have fluid or blood coming from the wound, scrape, corn, or callus. ? Your wound, scrape, corn, or callus feels warm to the touch. ? You have pus or a bad smell coming from the wound, scrape, corn, or callus. ? You have a fever. ? You have a red line going up your leg. Summary  Check your feet every day  for cuts, sores, red spots, swelling, and blisters.  Moisturize feet and legs daily.  Wear shoes that fit properly and have enough cushioning.  If you have foot problems, report any cuts, sores, or bruises to your health care provider immediately.  Schedule a complete foot exam at least once a year (annually) or more often if you have foot problems. This information is not intended to replace advice given to you by your health care provider. Make sure you discuss any questions you have with your health care provider. Document Released: 01/02/2000 Document Revised: 02/16/2017 Document Reviewed: 02/06/2016 Elsevier Interactive Patient Education  2019 [employer]  Onychomycosis/Fungal Toenails  WHAT IS IT? An infection that lies within the keratin of your nail plate that is caused by a fungus.  WHY ME? Fungal infections affect all ages, sexes, races, and creeds.  There may be many factors that predispose you to a fungal infection such as age, coexisting medical conditions such as diabetes, or an autoimmune disease; stress, medications, fatigue, genetics, etc.  Bottom line: fungus thrives in a warm, moist environment and your shoes offer such a location.  IS IT CONTAGIOUS? Theoretically, yes.  You do not want to share shoes, nail clippers or files with someone who has fungal toenails.  Walking around barefoot in the same room or sleeping in the same bed is unlikely to transfer the organism.  It is important to realize, however, that fungus can spread easily from one nail to the next on the same foot.  HOW DO WE TREAT THIS?  There are several ways to treat this condition.  Treatment may depend on many factors such as age, medications, pregnancy, liver and kidney conditions, etc.  It is best to ask your doctor which options are available to you.  1. No treatment.   Unlike many other medical concerns, you can live with this condition.  However for many people this can be a painful condition and  may lead to ingrown toenails or a bacterial infection.  It is recommended that you keep the nails cut short to help reduce the amount of fungal nail. 2. Topical treatment.  These range from herbal remedies to prescription strength nail lacquers.  About 40-50% effective, topicals require twice daily application for approximately 9 to 12 months or until an entirely new nail has grown out.  The most effective topicals are medical grade medications available through physicians offices. 3. Oral antifungal medications.  With an 80-90% cure rate, the most common oral medication requires 3 to 4 months of therapy and stays in your system for a year as the new nail grows out.  Oral antifungal medications do require   blood work to make sure it is a safe drug for you.  A liver function panel will be performed prior to starting the medication and after the first month of treatment.  It is important to have the blood work performed to avoid any harmful side effects.  In general, this medication safe but blood work is required. 4. Laser Therapy.  This treatment is performed by applying a specialized laser to the affected nail plate.  This therapy is noninvasive, fast, and non-painful.  It is not covered by insurance and is therefore, out of pocket.  The results have been very good with a 80-95% cure rate.  [facility] is the only practice in the area to offer this therapy. 5. Permanent Nail Avulsion.  Removing the entire nail so that a new nail will not grow back. 

## 2018-06-20 DIAGNOSIS — E119 Type 2 diabetes mellitus without complications: Secondary | ICD-10-CM | POA: Diagnosis not present

## 2018-06-24 NOTE — Progress Notes (Signed)
Subjective: John Romero presents to clinic today accompanied by his daughter.  He presents with history of diabetes, diabetic neuropathy history of diabetic ulcer of right second digit.  Daughter states ulcer has resolved since his last visit.  She states they still have Iodosorb gel at home.   [name], MD is his PCP.   Current Outpatient Medications:  .  allopurinol (ZYLOPRIM) 300 MG tablet, TAKE 1/2 TABLET DAILY TO PREVENT GOUT ATTACK, Disp: 90 tablet, Rfl: 1 .  apixaban (ELIQUIS) 2.5 MG TABS tablet, Take 1 tablet (2.5 mg total) by mouth 2 (two) times daily., Disp: 180 tablet, Rfl: 1 .  aspirin EC 81 MG tablet, Take 1 tablet (81 mg total) by mouth daily., Disp: 90 tablet, Rfl: 3 .  carvedilol (COREG) 25 MG tablet, TAKE 1 TABLET TWICE DAILY  WITH MEALS (TO REPLACE     ATENOLOL 25MG ), Disp: 180 tablet, Rfl: 3 .  cloNIDine (CATAPRES) 0.2 MG tablet, TK 1 T PO TID, Disp: , Rfl: 5 .  furosemide (LASIX) 40 MG tablet, Take 40mg  in the morning and 20mg  in the evening., Disp: 45 tablet, Rfl: 6 .  hydrALAZINE (APRESOLINE) 50 MG tablet, Take 1 tablet (50 mg total) by mouth 2 (two) times daily., Disp: 180 tablet, Rfl: 3 .  insulin aspart (NOVOLOG) 100 UNIT/ML injection, Inject into the skin as directed. 12 UNITS AM, 8 UNITS LUNCH, AND 12 UNITS PM ALL BEFORE MEALS, Disp: , Rfl:  .  Insulin Detemir (LEVEMIR) 100 UNIT/ML Pen, Inject 24 Units into the skin daily. , Disp: , Rfl:  .  Multiple Vitamin (MULTIVITAMIN) tablet, Take 1 tablet by mouth daily., Disp: , Rfl:  .  Respiratory Therapy Supplies (FLUTTER) DEVI, Use as directed, Disp: 1 each, Rfl: 0 .  simvastatin (ZOCOR) 20 MG tablet, Take 20 mg by mouth daily., Disp: , Rfl:  .  spironolactone (ALDACTONE) 25 MG tablet, Take 25 mg by mouth daily., Disp: , Rfl:   No Known Allergies  Objective: Vitals:   06/19/18 1636  Temp: 98.1 F (36.7 C)    Vascular Examination: Capillary refill time less than 3 seconds x 10 digits.  Dorsalis pedis  pulses palpable bilaterally.  Posterior tibial pulses palpable bilaterally.  Digital hair absent x 10 digits.  Skin temperature gradient within normal limits b/l.  Dermatological Examination: Skin with normal turgor, texture and tone b/l.  Toenails right great toe and digits 2-5 b/l discolored, thick, dystrophic with subungual debris and pain with palpation to nailbeds due to thickness of nails.  Upon debridement of left great toenail, there is noted evidence of subungual seroma left great toe.  Mild serous drainage noted subungually.  No erythema, no edema, no flocculence noted.  Hyperkeratotic lesion(s) distal tip right second digit.  Prior ulcer is completely resolved.  There is no erythema, no edema, no drainage, no flocculence noted.   Musculoskeletal: Muscle strength 5/5 to all LE muscle groups.  No pain, crepitus or joint limitation with passive/active ROM.  Neurological: Sensation intact 5/5 with 10 gram monofilament.  Vibratory sensation intact bilaterally.  Assessment: 1. Painful onychomycosis toenails 1-5 b/l 2. Corn distal tip right second digit 3. Subungual seroma left great toe 4. NIDDM with Diabetic neuropathy  Plan: 1. Continue diabetic foot care principles. Literature dispensed on today. 2. Ulcer right second toe is completely healed.  Dispensed toe tunnel for daily protection.  Daughter instructed how to apply total and how to check patient's digital circulation after application. 3. Toenails right great toe and digits  2-5 b/l were debrided in length and girth without iatrogenic bleeding. 4. For subungual seroma of the left great toe nail plate, it was debrided to level of adherence nail bed cleansed with wound cleanser.  Light bleeding addressed with Lumicain hemostatic solution.  Triple antibiotic ointment and Band-Aid applied.  Daughter instructed to apply Iodosorb gel once daily for 1 week patient to left great toe.  Call office if any problems  arise. 5. Continue soft, supportive shoe gear daily. 6. Patient to report any pedal injuries to medical professional  7. Follow up 3 months.  8. Patient/POA to call should there be a concern in the interim.

## 2018-06-26 ENCOUNTER — Telehealth: Payer: Self-pay | Admitting: Cardiovascular Disease

## 2018-06-26 NOTE — Telephone Encounter (Signed)
Left message to call back  

## 2018-06-26 NOTE — Telephone Encounter (Signed)
New message    Pt c/o BP issue: STAT if pt c/o blurred vision, one-sided weakness or slurred speech  1. What are your last 5 BP readings? 99/55  110/66   2. Are you having any other symptoms (ex. Dizziness, headache, blurred vision, passed out)? Dizzy, low energy   3. What is your BP issue? Patient's daughter states that his b/p is dropping

## 2018-06-27 NOTE — Telephone Encounter (Signed)
Returned call to daughter and notified her of [facility] recommendations. She wrote these down and will prepare her father's medications accordingly.

## 2018-06-27 NOTE — Telephone Encounter (Signed)
Left message for pt dtr to call  

## 2018-06-27 NOTE — Telephone Encounter (Signed)
Returned call to daughter [name]. She reports BP is "steadily low" (readings below). He had 1 high reading that she recalls of 159/76. He has AF so she said his automatic BP cuff may be off d/t that. His HR is 67-69bpm. He is fatigued, low energy.   99/55, 110/66, 103/55, 107/58  PCP decreased clonidine to 0.1mg  TID (from 0.2mg  TID) Patient is still on current doses of carvedilol, hydralazine, lasix, spironolactone as listed   Will routed to MD & [facility] to review meds, concerns and advise on low BP

## 2018-06-27 NOTE — Telephone Encounter (Signed)
Wean off clonidine (should NOT stop suddenly)  Instructions:  Decrease dose to clonidine 0.1mg  twice daily for 1 week, then decrease to clonidine 0.1mg  every evening for 1 week, then stop.  Continue to monitor blood pressure 1 to 2 times daily. Keep records of blood pressure readings and pulse.  Call back if additional adjustment needed.

## 2018-06-27 NOTE — Telephone Encounter (Signed)
Daughter called back returning a call from our office

## 2018-06-28 DIAGNOSIS — H16143 Punctate keratitis, bilateral: Secondary | ICD-10-CM | POA: Diagnosis not present

## 2018-07-03 DIAGNOSIS — H02002 Unspecified entropion of right lower eyelid: Secondary | ICD-10-CM | POA: Diagnosis not present

## 2018-07-03 DIAGNOSIS — H02005 Unspecified entropion of left lower eyelid: Secondary | ICD-10-CM | POA: Diagnosis not present

## 2018-08-14 DIAGNOSIS — Z85828 Personal history of other malignant neoplasm of skin: Secondary | ICD-10-CM | POA: Diagnosis not present

## 2018-08-14 DIAGNOSIS — C4371 Malignant melanoma of right lower limb, including hip: Secondary | ICD-10-CM | POA: Diagnosis not present

## 2018-08-14 DIAGNOSIS — D0462 Carcinoma in situ of skin of left upper limb, including shoulder: Secondary | ICD-10-CM | POA: Diagnosis not present

## 2018-08-14 DIAGNOSIS — L821 Other seborrheic keratosis: Secondary | ICD-10-CM | POA: Diagnosis not present

## 2018-08-14 DIAGNOSIS — D485 Neoplasm of uncertain behavior of skin: Secondary | ICD-10-CM | POA: Diagnosis not present

## 2018-08-21 DIAGNOSIS — H52223 Regular astigmatism, bilateral: Secondary | ICD-10-CM | POA: Diagnosis not present

## 2018-08-21 DIAGNOSIS — H5203 Hypermetropia, bilateral: Secondary | ICD-10-CM | POA: Diagnosis not present

## 2018-08-21 DIAGNOSIS — H02831 Dermatochalasis of right upper eyelid: Secondary | ICD-10-CM | POA: Diagnosis not present

## 2018-08-21 DIAGNOSIS — H02834 Dermatochalasis of left upper eyelid: Secondary | ICD-10-CM | POA: Diagnosis not present

## 2018-08-21 DIAGNOSIS — H524 Presbyopia: Secondary | ICD-10-CM | POA: Diagnosis not present

## 2018-08-22 DIAGNOSIS — Z85828 Personal history of other malignant neoplasm of skin: Secondary | ICD-10-CM | POA: Diagnosis not present

## 2018-08-22 DIAGNOSIS — C4371 Malignant melanoma of right lower limb, including hip: Secondary | ICD-10-CM | POA: Diagnosis not present

## 2018-08-22 DIAGNOSIS — L988 Other specified disorders of the skin and subcutaneous tissue: Secondary | ICD-10-CM | POA: Diagnosis not present

## 2018-08-22 DIAGNOSIS — Z85820 Personal history of malignant melanoma of skin: Secondary | ICD-10-CM | POA: Diagnosis not present

## 2018-08-24 DIAGNOSIS — Z4801 Encounter for change or removal of surgical wound dressing: Secondary | ICD-10-CM | POA: Diagnosis not present

## 2018-09-05 DIAGNOSIS — D0359 Melanoma in situ of other part of trunk: Secondary | ICD-10-CM | POA: Diagnosis not present

## 2018-09-05 DIAGNOSIS — Z85820 Personal history of malignant melanoma of skin: Secondary | ICD-10-CM | POA: Diagnosis not present

## 2018-09-05 DIAGNOSIS — D485 Neoplasm of uncertain behavior of skin: Secondary | ICD-10-CM | POA: Diagnosis not present

## 2018-09-05 DIAGNOSIS — D045 Carcinoma in situ of skin of trunk: Secondary | ICD-10-CM | POA: Diagnosis not present

## 2018-09-05 DIAGNOSIS — L821 Other seborrheic keratosis: Secondary | ICD-10-CM | POA: Diagnosis not present

## 2018-09-05 DIAGNOSIS — Z85828 Personal history of other malignant neoplasm of skin: Secondary | ICD-10-CM | POA: Diagnosis not present

## 2018-09-05 DIAGNOSIS — D225 Melanocytic nevi of trunk: Secondary | ICD-10-CM | POA: Diagnosis not present

## 2018-09-05 DIAGNOSIS — L905 Scar conditions and fibrosis of skin: Secondary | ICD-10-CM | POA: Diagnosis not present

## 2018-09-05 DIAGNOSIS — D1801 Hemangioma of skin and subcutaneous tissue: Secondary | ICD-10-CM | POA: Diagnosis not present

## 2018-09-05 DIAGNOSIS — L814 Other melanin hyperpigmentation: Secondary | ICD-10-CM | POA: Diagnosis not present

## 2018-09-26 ENCOUNTER — Ambulatory Visit: Payer: Medicare Other | Admitting: Podiatry

## 2018-10-03 ENCOUNTER — Ambulatory Visit (INDEPENDENT_AMBULATORY_CARE_PROVIDER_SITE_OTHER): Payer: Medicare Other | Admitting: Podiatry

## 2018-10-03 ENCOUNTER — Other Ambulatory Visit: Payer: Self-pay

## 2018-10-03 ENCOUNTER — Encounter: Payer: Self-pay | Admitting: Podiatry

## 2018-10-03 DIAGNOSIS — M79674 Pain in right toe(s): Secondary | ICD-10-CM

## 2018-10-03 DIAGNOSIS — L84 Corns and callosities: Secondary | ICD-10-CM

## 2018-10-03 DIAGNOSIS — Z9229 Personal history of other drug therapy: Secondary | ICD-10-CM

## 2018-10-03 DIAGNOSIS — M79675 Pain in left toe(s): Secondary | ICD-10-CM

## 2018-10-03 DIAGNOSIS — E1142 Type 2 diabetes mellitus with diabetic polyneuropathy: Secondary | ICD-10-CM | POA: Diagnosis not present

## 2018-10-03 DIAGNOSIS — B351 Tinea unguium: Secondary | ICD-10-CM

## 2018-10-03 NOTE — Patient Instructions (Signed)
Diabetes Mellitus and Foot Care Foot care is an important part of your health, especially when you have diabetes. Diabetes may cause you to have problems because of poor blood flow (circulation) to your feet and legs, which can cause your skin to:  Become thinner and drier.  Break more easily.  Heal more slowly.  Peel and crack. You may also have nerve damage (neuropathy) in your legs and feet, causing decreased feeling in them. This means that you may not notice minor injuries to your feet that could lead to more serious problems. Noticing and addressing any potential problems early is the best way to prevent future foot problems. How to care for your feet Foot hygiene  Wash your feet daily with warm water and mild soap. Do not use hot water. Then, pat your feet and the areas between your toes until they are completely dry. Do not soak your feet as this can dry your skin.  Trim your toenails straight across. Do not dig under them or around the cuticle. File the edges of your nails with an emery board or nail file.  Apply a moisturizing lotion or petroleum jelly to the skin on your feet and to dry, brittle toenails. Use lotion that does not contain alcohol and is unscented. Do not apply lotion between your toes. Shoes and socks  Wear clean socks or stockings every day. Make sure they are not too tight. Do not wear knee-high stockings since they may decrease blood flow to your legs.  Wear shoes that fit properly and have enough cushioning. Always look in your shoes before you put them on to be sure there are no objects inside.  To break in new shoes, wear them for just a few hours a day. This prevents injuries on your feet. Wounds, scrapes, corns, and calluses  Check your feet daily for blisters, cuts, bruises, sores, and redness. If you cannot see the bottom of your feet, use a mirror or ask someone for help.  Do not cut corns or calluses or try to remove them with medicine.  If you  find a minor scrape, cut, or break in the skin on your feet, keep it and the skin around it clean and dry. You may clean these areas with mild soap and water. Do not clean the area with peroxide, alcohol, or iodine.  If you have a wound, scrape, corn, or callus on your foot, look at it several times a day to make sure it is healing and not infected. Check for: ? Redness, swelling, or pain. ? Fluid or blood. ? Warmth. ? Pus or a bad smell. General instructions  Do not cross your legs. This may decrease blood flow to your feet.  Do not use heating pads or hot water bottles on your feet. They may burn your skin. If you have lost feeling in your feet or legs, you may not know this is happening until it is too late.  Protect your feet from hot and cold by wearing shoes, such as at the beach or on hot pavement.  Schedule a complete foot exam at least once a year (annually) or more often if you have foot problems. If you have foot problems, report any cuts, sores, or bruises to your health care provider immediately. Contact a health care provider if:  You have a medical condition that increases your risk of infection and you have any cuts, sores, or bruises on your feet.  You have an injury that is not   healing.  You have redness on your legs or feet.  You feel burning or tingling in your legs or feet.  You have pain or cramps in your legs and feet.  Your legs or feet are numb.  Your feet always feel cold.  You have pain around a toenail. Get help right away if:  You have a wound, scrape, corn, or callus on your foot and: ? You have pain, swelling, or redness that gets worse. ? You have fluid or blood coming from the wound, scrape, corn, or callus. ? Your wound, scrape, corn, or callus feels warm to the touch. ? You have pus or a bad smell coming from the wound, scrape, corn, or callus. ? You have a fever. ? You have a red line going up your leg. Summary  Check your feet every day  for cuts, sores, red spots, swelling, and blisters.  Moisturize feet and legs daily.  Wear shoes that fit properly and have enough cushioning.  If you have foot problems, report any cuts, sores, or bruises to your health care provider immediately.  Schedule a complete foot exam at least once a year (annually) or more often if you have foot problems. This information is not intended to replace advice given to you by your health care provider. Make sure you discuss any questions you have with your health care provider. Document Released: 01/02/2000 Document Revised: 02/16/2017 Document Reviewed: 02/06/2016 Elsevier Patient Education  2020 [employer]   Onychomycosis/Fungal Toenails  WHAT IS IT? An infection that lies within the keratin of your nail plate that is caused by a fungus.  WHY ME? Fungal infections affect all ages, sexes, races, and creeds.  There may be many factors that predispose you to a fungal infection such as age, coexisting medical conditions such as diabetes, or an autoimmune disease; stress, medications, fatigue, genetics, etc.  Bottom line: fungus thrives in a warm, moist environment and your shoes offer such a location.  IS IT CONTAGIOUS? Theoretically, yes.  You do not want to share shoes, nail clippers or files with someone who has fungal toenails.  Walking around barefoot in the same room or sleeping in the same bed is unlikely to transfer the organism.  It is important to realize, however, that fungus can spread easily from one nail to the next on the same foot.  HOW DO WE TREAT THIS?  There are several ways to treat this condition.  Treatment may depend on many factors such as age, medications, pregnancy, liver and kidney conditions, etc.  It is best to ask your doctor which options are available to you.  1. No treatment.   Unlike many other medical concerns, you can live with this condition.  However for many people this can be a painful condition and may lead to  ingrown toenails or a bacterial infection.  It is recommended that you keep the nails cut short to help reduce the amount of fungal nail. 2. Topical treatment.  These range from herbal remedies to prescription strength nail lacquers.  About 40-50% effective, topicals require twice daily application for approximately 9 to 12 months or until an entirely new nail has grown out.  The most effective topicals are medical grade medications available through physicians offices. 3. Oral antifungal medications.  With an 80-90% cure rate, the most common oral medication requires 3 to 4 months of therapy and stays in your system for a year as the new nail grows out.  Oral antifungal medications do require   blood work to make sure it is a safe drug for you.  A liver function panel will be performed prior to starting the medication and after the first month of treatment.  It is important to have the blood work performed to avoid any harmful side effects.  In general, this medication safe but blood work is required. 4. Laser Therapy.  This treatment is performed by applying a specialized laser to the affected nail plate.  This therapy is noninvasive, fast, and non-painful.  It is not covered by insurance and is therefore, out of pocket.  The results have been very good with a 80-95% cure rate.  [facility] is the only practice in the area to offer this therapy. 5. Permanent Nail Avulsion.  Removing the entire nail so that a new nail will not grow back. 

## 2018-10-09 NOTE — Progress Notes (Signed)
Subjective: John Romero presents to clinic with cc of painful mycotic toenails, corns and calluses b/l which are aggravated when weightbearing with and without shoe gear.  This pain limits his daily activities. Pain symptoms resolve with periodic professional debridement.  John Manes, MD is his PCP.   His daughter is present during the visit. They state toe tunnel is working great for his right 2nd digit.   Current Outpatient Medications:  .  allopurinol (ZYLOPRIM) 300 MG tablet, TAKE 1/2 TABLET DAILY TO PREVENT GOUT ATTACK, Disp: 90 tablet, Rfl: 1 .  apixaban (ELIQUIS) 2.5 MG TABS tablet, Take 1 tablet (2.5 mg total) by mouth 2 (two) times daily., Disp: 180 tablet, Rfl: 1 .  aspirin EC 81 MG tablet, Take 1 tablet (81 mg total) by mouth daily., Disp: 90 tablet, Rfl: 3 .  carvedilol (COREG) 25 MG tablet, TAKE 1 TABLET TWICE DAILY  WITH MEALS (TO REPLACE     ATENOLOL 25MG ), Disp: 180 tablet, Rfl: 3 .  cloNIDine (CATAPRES) 0.1 MG tablet, Take 0.1 mg by mouth 3 (three) times daily., Disp: , Rfl:  .  doxycycline (VIBRAMYCIN) 100 MG capsule, TK 1 C PO BID WF, Disp: , Rfl:  .  furosemide (LASIX) 40 MG tablet, Take 40mg  in the morning and 20mg  in the evening., Disp: 45 tablet, Rfl: 6 .  hydrALAZINE (APRESOLINE) 50 MG tablet, Take 1 tablet (50 mg total) by mouth 2 (two) times daily., Disp: 180 tablet, Rfl: 3 .  insulin aspart (NOVOLOG) 100 UNIT/ML injection, Inject into the skin as directed. 12 UNITS AM, 8 UNITS LUNCH, AND 12 UNITS PM ALL BEFORE MEALS, Disp: , Rfl:  .  Insulin Detemir (LEVEMIR) 100 UNIT/ML Pen, Inject 24 Units into the skin daily. , Disp: , Rfl:  .  Multiple Vitamin (MULTIVITAMIN) tablet, Take 1 tablet by mouth daily., Disp: , Rfl:  .  mupirocin ointment (BACTROBAN) 2 %, APPLY TOPICALLY TO BIOPSY SITE ONCE D UTD, Disp: , Rfl:  .  Respiratory Therapy Supplies (FLUTTER) DEVI, Use as directed, Disp: 1 each, Rfl: 0 .  simvastatin (ZOCOR) 20 MG tablet, Take 20 mg by mouth daily., Disp:  , Rfl:  .  spironolactone (ALDACTONE) 25 MG tablet, Take 25 mg by mouth daily., Disp: , Rfl:  .  tobramycin-dexamethasone (TOBRADEX) ophthalmic solution, SHAKE LQ AND INT 1 GTT IN OU QID, Disp: , Rfl:    No Known Allergies   Objective: Physical Examination:  Vascular  Examination: Capillary refill time <3 seconds x 10 digits.  Palpable DP/PT pulses b/l.  Digital hair absent b/l.  No edema noted b/l.  Skin temperature gradient WNL b/l.  Dermatological Examination: Skin with normal turgor, texture and tone b/l.  No open wounds b/l.  No interdigital macerations noted b/l.  Elongated, thick, discolored brittle toenails with subungual debris and pain on dorsal palpation of nailbeds 1-5 b/l.  Hyperkeratotic lesion distal tip right 2nd and right 4th digits. No erythema, no edema, no drainage, no flocculence noted.  Musculoskeletal Examination: Muscle strength 5/5 to all muscle groups b/l.  No pain, crepitus or joint discomfort with active/passive ROM.  Neurological Examination: Sensation intact 5/5 b/l with 10 gram monofilament.  Vibratory sensation intact b/l.  Proprioceptive sensation intact b/l.  Assessment: 1. Mycotic nail infection with pain 1-5 b/l 2. Corns distal tip right 2nd and right 4th digits 3. NIDDM 4. Patient on long term blood thinner  Plan: 1. Toenails 1-5 b/l were debrided in length and girth without iatrogenic laceration.  2. Corn(s) pared  right 2nd and right 4th digits utilizing sterile scalpel blade without incident. 3. Continue soft, supportive shoe gear daily. 4. Report any pedal injuries to medical professional. 5. Follow up 3 months. 6. Patient/POA to call should there be a question/concern in there interim.

## 2018-10-23 DIAGNOSIS — D0359 Melanoma in situ of other part of trunk: Secondary | ICD-10-CM | POA: Diagnosis not present

## 2018-10-23 DIAGNOSIS — Z85828 Personal history of other malignant neoplasm of skin: Secondary | ICD-10-CM | POA: Diagnosis not present

## 2018-10-23 DIAGNOSIS — Z85820 Personal history of malignant melanoma of skin: Secondary | ICD-10-CM | POA: Diagnosis not present

## 2018-10-25 DIAGNOSIS — Z1389 Encounter for screening for other disorder: Secondary | ICD-10-CM | POA: Diagnosis not present

## 2018-10-25 DIAGNOSIS — I4891 Unspecified atrial fibrillation: Secondary | ICD-10-CM | POA: Diagnosis not present

## 2018-10-25 DIAGNOSIS — Z0000 Encounter for general adult medical examination without abnormal findings: Secondary | ICD-10-CM | POA: Diagnosis not present

## 2018-10-25 DIAGNOSIS — I872 Venous insufficiency (chronic) (peripheral): Secondary | ICD-10-CM | POA: Diagnosis not present

## 2018-10-25 DIAGNOSIS — I129 Hypertensive chronic kidney disease with stage 1 through stage 4 chronic kidney disease, or unspecified chronic kidney disease: Secondary | ICD-10-CM | POA: Diagnosis not present

## 2018-10-25 DIAGNOSIS — E1140 Type 2 diabetes mellitus with diabetic neuropathy, unspecified: Secondary | ICD-10-CM | POA: Diagnosis not present

## 2018-10-25 DIAGNOSIS — E1151 Type 2 diabetes mellitus with diabetic peripheral angiopathy without gangrene: Secondary | ICD-10-CM | POA: Diagnosis not present

## 2018-10-25 DIAGNOSIS — Z79899 Other long term (current) drug therapy: Secondary | ICD-10-CM | POA: Diagnosis not present

## 2018-10-25 DIAGNOSIS — I5032 Chronic diastolic (congestive) heart failure: Secondary | ICD-10-CM | POA: Diagnosis not present

## 2018-10-25 DIAGNOSIS — N1831 Chronic kidney disease, stage 3a: Secondary | ICD-10-CM | POA: Diagnosis not present

## 2018-10-25 DIAGNOSIS — E1121 Type 2 diabetes mellitus with diabetic nephropathy: Secondary | ICD-10-CM | POA: Diagnosis not present

## 2018-11-01 DIAGNOSIS — Z23 Encounter for immunization: Secondary | ICD-10-CM | POA: Diagnosis not present

## 2018-11-20 ENCOUNTER — Other Ambulatory Visit: Payer: Self-pay | Admitting: Cardiovascular Disease

## 2018-11-21 NOTE — Telephone Encounter (Signed)
88 M, 94.8 kg, Scr 1.78 (10/19), has OV with Gwenlyn Found on 11-10, will repeat labs

## 2018-11-29 ENCOUNTER — Other Ambulatory Visit: Payer: Self-pay

## 2018-11-29 ENCOUNTER — Encounter: Payer: Self-pay | Admitting: Cardiovascular Disease

## 2018-11-29 ENCOUNTER — Ambulatory Visit (INDEPENDENT_AMBULATORY_CARE_PROVIDER_SITE_OTHER): Payer: Medicare Other | Admitting: Cardiovascular Disease

## 2018-11-29 VITALS — BP 156/89 | HR 65 | Ht 72.0 in | Wt 202.0 lb

## 2018-11-29 DIAGNOSIS — Z951 Presence of aortocoronary bypass graft: Secondary | ICD-10-CM | POA: Diagnosis not present

## 2018-11-29 DIAGNOSIS — I5042 Chronic combined systolic (congestive) and diastolic (congestive) heart failure: Secondary | ICD-10-CM

## 2018-11-29 DIAGNOSIS — E785 Hyperlipidemia, unspecified: Secondary | ICD-10-CM | POA: Diagnosis not present

## 2018-11-29 DIAGNOSIS — R600 Localized edema: Secondary | ICD-10-CM | POA: Diagnosis not present

## 2018-11-29 DIAGNOSIS — I10 Essential (primary) hypertension: Secondary | ICD-10-CM

## 2018-11-29 DIAGNOSIS — I480 Paroxysmal atrial fibrillation: Secondary | ICD-10-CM | POA: Diagnosis not present

## 2018-11-29 NOTE — Assessment & Plan Note (Signed)
History of hyperlipidemia on statin therapy with lipid profile performed 10/25/2018 revealing total cholesterol 167, LDL 91 and HDL 38.

## 2018-11-29 NOTE — Progress Notes (Signed)
11/29/2018 John Romero   [birth year]  [id number]  Primary Physician [name], MD Primary Cardiologist: [name] MD Lupe Carney, [place]  HPI:  John Romero is a 85 y.o.  moderately overweight widowed Caucasian male father of 2, grandfather to one grandchild is retired from working at [employer].I last saw him in the office  11/15/2017. He is accompanied by his daughter [name] today.  He lives alone and is independent.  He does drive somewhat.  He walks with a walker.  I saw him in the hospital when he was admitted with hypertensive encephalopathy. He has a history of hypertension, hyperlipidemia and type 2 diabetes. He has chronic kidney disease stage III and new onset A. Fib. He had coronary artery bypass grafting back in 1995. A 2-D echo during his hospitalization showed normal left ventricular function. He saw [name] [facility] in the office for his initial posthospital follow-up on 11/19/14 and was doing well at that time. She did have a discussion with him regarding beginning oral anticoagulation given his high CHA2DSVASC2 score (7) however the patient declined at that time wishing to remain on aspirin. Over the last several weeks prior to his December appointment he had progressive weight gain, dyspnea or resting shortness of breath. He was clearly in heart failure and 5 mg 3+ pitting edema. I doubled his diuretics. 2-D echo that showed a decline in his ejection fraction from 55-60% to 40-45% with a new wall motion abnormality. He has slowly diuresed on oral diuretic. A Myoview stress test performed on 02/07/15 showed a small area of inferolateral scar with mild peri-infarct ischemia and an ejection fraction of 47%. We have talked about initiating oral anticoagulant therapy which he agreed to initiate. We began him on Eliquis . He's been well since. He denies chest pain or shortness of breath. Since I saw him last he's done relatively well. He is somewhat unstable on his feet.  He has chronic lower extremity edema and does admit to dietary indiscretion with regards to salt. He wasseen in the emergency room with what sounds like a TIA. He has been seen by [name] PA-C in the office on3/06/2017 and was stable at that time. Continue to have lower extremity edema on diuretics. Since I saw him 6 months ago his major issue is with hemoptysis. He has been evaluated and told he had "chronic pneumonia and is been on antibiotics in the past. His symptoms are worse in the morning. A CT scan performed 07/06/2017 showed interstitial changes worrisome for alveolar hemorrhage hemorrhage. Referral to pulmonologist was recommended.  He has seen Dr. Annamaria Boots for pulmonary evaluation.  His major complaint is of coughing up phlegm.  He had a chest CT that has showed pulmonary fibrosis and interstitial lung disease with a working diagnosis being chronic bronchitis.  Since I saw him a year ago he continues to do well.  No longer has hemoptysis.  He has chronic lower extremity edema left greater than right although this is somewhat improved.  He has chronic shortness of breath but denies chest pain.  He remains in A. fib rate controlled on Eliquis oral anticoagulation.   Current Meds  Medication Sig   allopurinol (ZYLOPRIM) 300 MG tablet TAKE 1/2 TABLET DAILY TO PREVENT GOUT ATTACK   aspirin EC 81 MG tablet Take 1 tablet (81 mg total) by mouth daily.   carvedilol (COREG) 25 MG tablet Take 1 tablet (25 mg total) by mouth 2 (two) times daily  with a meal. Please keep upcoming appt for future refills. Thank you   ELIQUIS 2.5 MG TABS tablet TAKE 1 TABLET TWICE A DAY  (DOSE CHANGE)   furosemide (LASIX) 40 MG tablet Take 40mg  in the morning and 20mg  in the evening.   hydrALAZINE (APRESOLINE) 50 MG tablet Take 1 tablet (50 mg total) by mouth 2 (two) times daily.   insulin aspart (NOVOLOG) 100 UNIT/ML injection Inject into the skin as directed. 12 UNITS AM, 8 UNITS LUNCH, AND 12 UNITS PM ALL  BEFORE MEALS   Insulin Detemir (LEVEMIR) 100 UNIT/ML Pen Inject 24 Units into the skin daily.    Multiple Vitamin (MULTIVITAMIN) tablet Take 1 tablet by mouth daily.   mupirocin ointment (BACTROBAN) 2 % APPLY TOPICALLY TO BIOPSY SITE ONCE D UTD   Respiratory Therapy Supplies (FLUTTER) DEVI Use as directed   simvastatin (ZOCOR) 20 MG tablet Take 20 mg by mouth daily.   spironolactone (ALDACTONE) 25 MG tablet Take 25 mg by mouth daily.   tobramycin-dexamethasone (TOBRADEX) ophthalmic solution SHAKE LQ AND INT 1 GTT IN OU QID   [DISCONTINUED] cloNIDine (CATAPRES) 0.1 MG tablet Take 0.1 mg by mouth 3 (three) times daily.   [DISCONTINUED] doxycycline (VIBRAMYCIN) 100 MG capsule TK 1 C PO BID WF     No Known Allergies  Social History   Socioeconomic History   Marital status: Widowed    Spouse name: Not on file   Number of children: Not on file   Years of education: Not on file   Highest education level: Not on file  Occupational History   Not on file  Social Needs   Financial resource strain: Not on file   Food insecurity    Worry: Not on file    Inability: Not on file   Transportation needs    Medical: Not on file    Non-medical: Not on file  Tobacco Use   Smoking status: Never Smoker   Smokeless tobacco: Current User    Types: Chew  Substance and Sexual Activity   Alcohol use: No   Drug use: No   Sexual activity: Not on file    Comment: unknown  Lifestyle   Physical activity    Days per week: Not on file    Minutes per session: Not on file   Stress: Not on file  Relationships   Social connections    Talks on phone: Not on file    Gets together: Not on file    Attends religious service: Not on file    Active member of club or organization: Not on file    Attends meetings of clubs or organizations: Not on file    Relationship status: Not on file   Intimate partner violence    Fear of current or ex partner: Not on file    Emotionally abused:  Not on file    Physically abused: Not on file    Forced sexual activity: Not on file  Other Topics Concern   Not on file  Social History Narrative   Not on file     Review of Systems: General: negative for chills, fever, night sweats or weight changes.  Cardiovascular: negative for chest pain, dyspnea on exertion, edema, orthopnea, palpitations, paroxysmal nocturnal dyspnea or shortness of breath Dermatological: negative for rash Respiratory: negative for cough or wheezing Urologic: negative for hematuria Abdominal: negative for nausea, vomiting, diarrhea, bright red blood per rectum, melena, or hematemesis Neurologic: negative for visual changes, syncope, or dizziness All other systems  reviewed and are otherwise negative except as noted above.    Blood pressure (!) 156/89, pulse 65, height 6' (1.829 m), weight 202 lb (91.6 kg), SpO2 99 %.  General appearance: alert and no distress Neck: no adenopathy, no carotid bruit, no JVD, supple, symmetrical, trachea midline and thyroid not enlarged, symmetric, no tenderness/mass/nodules Lungs: clear to auscultation bilaterally Heart: irregularly irregular rhythm Extremities: 2+ left, 1+ right lower extremity edema Pulses: Diminished pedal pulses Skin: Venous stasis changes Neurologic: Alert and oriented X 3, normal strength and tone. Normal symmetric reflexes. Normal coordination and gait  EKG atrial fibrillation with ventricular spots of 65.  I personally reviewed this EKG.  ASSESSMENT AND PLAN:   Lower leg edema Chronic lower extremity edema left greater than right on oral diuretics  Hyperlipidemia LDL goal <100 History of hyperlipidemia on statin therapy with lipid profile performed 10/25/2018 revealing total cholesterol 167, LDL 91 and HDL 38.  Essential hypertension History of essential hypertension with blood pressure measured today 156/89 although at home when he measures it is much better than this.  He is on carvedilol,  hydralazine and spironolactone.  PAF (paroxysmal atrial fibrillation) (HCC) History of atrial fibrillation on Eliquis oral anticoagulation (low-dose)  Hx of CABG History of CAD status post CABG in 1995.  Myoview performed 02/07/2015 showed a small area of inferolateral scar with mild peri-infarct ischemia.  He denies chest pain.  Chronic combined systolic and diastolic CHF (congestive heart failure) ([place]) History of combined systolic and diastolic heart failure with echo performed 07/28/2017 revealed EF of 55 to 60%      [name] MD [facility], [facility] 11/29/2018 3:45 PM

## 2018-11-29 NOTE — Assessment & Plan Note (Signed)
Chronic lower extremity edema left greater than right on oral diuretics

## 2018-11-29 NOTE — Assessment & Plan Note (Signed)
History of CAD status post CABG in 1995.  Myoview performed 02/07/2015 showed a small area of inferolateral scar with mild peri-infarct ischemia.  He denies chest pain.

## 2018-11-29 NOTE — Patient Instructions (Signed)
Medication Instructions:  Your physician recommends that you continue on your current medications as directed. Please refer to the Current Medication list given to you today.  If you need a refill on your cardiac medications before your next appointment, please call your pharmacy.   Lab work: NONE  Testing/Procedures: NONE  Follow-Up: At [facility], you and your health needs are our priority.  As part of our continuing mission to provide you with exceptional heart care, we have created designated Provider Care Teams.  These Care Teams include your primary Cardiologist (physician) and Advanced Practice Providers (APPs -  Physician Assistants and Nurse Practitioners) who all work together to provide you with the care you need, when you need it. You may see Dr Berry or one of the following Advanced Practice Providers on your designated Care Team:    Luke Kilroy, PA-C  Callie Goodrich, PA-C  Jesse Cleaver, FNP  Your physician wants you to follow-up in: 1 year. You will receive a reminder letter in the mail two months in advance. If you don't receive a letter, please call our office to schedule the follow-up appointment.      

## 2018-11-29 NOTE — Assessment & Plan Note (Signed)
History of essential hypertension with blood pressure measured today 156/89 although at home when he measures it is much better than this.  He is on carvedilol, hydralazine and spironolactone.

## 2018-11-29 NOTE — Assessment & Plan Note (Signed)
History of combined systolic and diastolic heart failure with echo performed 07/28/2017 revealed EF of 55 to 60%

## 2018-11-29 NOTE — Assessment & Plan Note (Signed)
History of atrial fibrillation on Eliquis oral anticoagulation (low-dose)

## 2018-12-12 ENCOUNTER — Other Ambulatory Visit: Payer: Self-pay | Admitting: Cardiovascular Disease

## 2018-12-28 DIAGNOSIS — D1801 Hemangioma of skin and subcutaneous tissue: Secondary | ICD-10-CM | POA: Diagnosis not present

## 2018-12-28 DIAGNOSIS — Z85828 Personal history of other malignant neoplasm of skin: Secondary | ICD-10-CM | POA: Diagnosis not present

## 2018-12-28 DIAGNOSIS — Z85820 Personal history of malignant melanoma of skin: Secondary | ICD-10-CM | POA: Diagnosis not present

## 2018-12-28 DIAGNOSIS — D225 Melanocytic nevi of trunk: Secondary | ICD-10-CM | POA: Diagnosis not present

## 2018-12-28 DIAGNOSIS — L821 Other seborrheic keratosis: Secondary | ICD-10-CM | POA: Diagnosis not present

## 2019-01-02 ENCOUNTER — Ambulatory Visit (INDEPENDENT_AMBULATORY_CARE_PROVIDER_SITE_OTHER): Payer: Medicare Other | Admitting: Podiatry

## 2019-01-02 ENCOUNTER — Encounter: Payer: Self-pay | Admitting: Podiatry

## 2019-01-02 ENCOUNTER — Other Ambulatory Visit: Payer: Self-pay

## 2019-01-02 DIAGNOSIS — B351 Tinea unguium: Secondary | ICD-10-CM | POA: Diagnosis not present

## 2019-01-02 DIAGNOSIS — M79675 Pain in left toe(s): Secondary | ICD-10-CM | POA: Diagnosis not present

## 2019-01-02 DIAGNOSIS — M79674 Pain in right toe(s): Secondary | ICD-10-CM

## 2019-01-02 NOTE — Patient Instructions (Signed)
Diabetes Mellitus and Foot Care Foot care is an important part of your health, especially when you have diabetes. Diabetes may cause you to have problems because of poor blood flow (circulation) to your feet and legs, which can cause your skin to:  Become thinner and drier.  Break more easily.  Heal more slowly.  Peel and crack. You may also have nerve damage (neuropathy) in your legs and feet, causing decreased feeling in them. This means that you may not notice minor injuries to your feet that could lead to more serious problems. Noticing and addressing any potential problems early is the best way to prevent future foot problems. How to care for your feet Foot hygiene  Wash your feet daily with warm water and mild soap. Do not use hot water. Then, pat your feet and the areas between your toes until they are completely dry. Do not soak your feet as this can dry your skin.  Trim your toenails straight across. Do not dig under them or around the cuticle. File the edges of your nails with an emery board or nail file.  Apply a moisturizing lotion or petroleum jelly to the skin on your feet and to dry, brittle toenails. Use lotion that does not contain alcohol and is unscented. Do not apply lotion between your toes. Shoes and socks  Wear clean socks or stockings every day. Make sure they are not too tight. Do not wear knee-high stockings since they may decrease blood flow to your legs.  Wear shoes that fit properly and have enough cushioning. Always look in your shoes before you put them on to be sure there are no objects inside.  To break in new shoes, wear them for just a few hours a day. This prevents injuries on your feet. Wounds, scrapes, corns, and calluses  Check your feet daily for blisters, cuts, bruises, sores, and redness. If you cannot see the bottom of your feet, use a mirror or ask someone for help.  Do not cut corns or calluses or try to remove them with medicine.  If you  find a minor scrape, cut, or break in the skin on your feet, keep it and the skin around it clean and dry. You may clean these areas with mild soap and water. Do not clean the area with peroxide, alcohol, or iodine.  If you have a wound, scrape, corn, or callus on your foot, look at it several times a day to make sure it is healing and not infected. Check for: ? Redness, swelling, or pain. ? Fluid or blood. ? Warmth. ? Pus or a bad smell. General instructions  Do not cross your legs. This may decrease blood flow to your feet.  Do not use heating pads or hot water bottles on your feet. They may burn your skin. If you have lost feeling in your feet or legs, you may not know this is happening until it is too late.  Protect your feet from hot and cold by wearing shoes, such as at the beach or on hot pavement.  Schedule a complete foot exam at least once a year (annually) or more often if you have foot problems. If you have foot problems, report any cuts, sores, or bruises to your health care provider immediately. Contact a health care provider if:  You have a medical condition that increases your risk of infection and you have any cuts, sores, or bruises on your feet.  You have an injury that is not   healing.  You have redness on your legs or feet.  You feel burning or tingling in your legs or feet.  You have pain or cramps in your legs and feet.  Your legs or feet are numb.  Your feet always feel cold.  You have pain around a toenail. Get help right away if:  You have a wound, scrape, corn, or callus on your foot and: ? You have pain, swelling, or redness that gets worse. ? You have fluid or blood coming from the wound, scrape, corn, or callus. ? Your wound, scrape, corn, or callus feels warm to the touch. ? You have pus or a bad smell coming from the wound, scrape, corn, or callus. ? You have a fever. ? You have a red line going up your leg. Summary  Check your feet every day  for cuts, sores, red spots, swelling, and blisters.  Moisturize feet and legs daily.  Wear shoes that fit properly and have enough cushioning.  If you have foot problems, report any cuts, sores, or bruises to your health care provider immediately.  Schedule a complete foot exam at least once a year (annually) or more often if you have foot problems. This information is not intended to replace advice given to you by your health care provider. Make sure you discuss any questions you have with your health care provider. Document Released: 01/02/2000 Document Revised: 02/16/2017 Document Reviewed: 02/06/2016 Elsevier Patient Education  2020 [employer] Inc[employer]  

## 2019-01-14 NOTE — Progress Notes (Signed)
Subjective: John Romero is a 85 y.o. y.o. male with h/o diabetes and diabetic neuropathy. He presents today for preventative diabetic foot care. Patient has painful, elongated mycotic toenails and corns right foot which pose a risk and interfere with daily activities. Pain is aggravated when wearing enclosed shoe gear and relieved with periodic professional debridement.  [name], MD is patient's PCP.   Medications reviewed in chart.  No Known Allergies  Objective: There were no vitals filed for this visit.  Vascular Examination: Capillary refill time <3 seconds b/l.  Dorsalis pedis pulses palpable b/l.  Posterior tibial pulses palpable b/l.  No digital hair b/l.  Skin temperature gradient WNL b/l.  Dermatological Examination: Skin with normal turgor, texture and tone b/l.  Toenails 1-5 b/l discolored, thick, dystrophic with subungual debris and pain with palpation to nailbeds due to thickness of nails.  Resolved hyperkeratotic lesions right 2nd and right 4th digits.   Musculoskeletal: Muscle strength 5/5 to all LE muscle groups b/l.  Neurological: Sensation intact 5/5 b/l with 10 gram monofilament.   Last A1c: 9.7%  Assessment: 1. Painful onychomycosis toenails 1-5 b/l  Plan: 1. Continue diabetic foot care principles. Literature dispensed on today. 2. Toenails 1-5 b/l were debrided in length and girth without iatrogenic bleeding. 3. Patient to continue soft, supportive shoe gear daily. 4. Patient to report any pedal injuries to medical professional immediately. 5. Follow up 3 months.  6. Patient/POA to call should there be a concern in the interim.

## 2019-02-15 ENCOUNTER — Other Ambulatory Visit: Payer: Self-pay | Admitting: Cardiovascular Disease

## 2019-02-27 ENCOUNTER — Other Ambulatory Visit: Payer: Self-pay | Admitting: *Deleted

## 2019-02-27 MED ORDER — APIXABAN 2.5 MG PO TABS: 2.5000 mg | ORAL_TABLET | Freq: Two times a day (BID) | ORAL | 2 refills | Status: AC

## 2019-02-27 NOTE — Telephone Encounter (Signed)
Rx has been sent to the pharmacy electronically. ° °

## 2019-03-31 ENCOUNTER — Ambulatory Visit: Payer: Medicare Other | Attending: Internal Medicine

## 2019-03-31 DIAGNOSIS — Z23 Encounter for immunization: Secondary | ICD-10-CM

## 2019-03-31 NOTE — Progress Notes (Signed)
   Covid-19 Vaccination Clinic  Name:  John Romero    MRN: [id number] DOB: 1930-04-04  03/31/2019  [name] was observed post Covid-19 immunization for 15 minutes without incident. He was provided with Vaccine Information Sheet and instruction to access the V-Safe system.   [name] was instructed to call 911 with any severe reactions post vaccine: Marland Kitchen Difficulty breathing  . Swelling of face and throat  . A fast heartbeat  . A bad rash all over body  . Dizziness and weakness   Immunizations Administered    Name Date Dose VIS Date Route   Pfizer COVID-19 Vaccine 03/31/2019  1:17 PM 0.3 mL 12/29/2018 Intramuscular   Manufacturer: [place]   Lot: [id number]   [place]: [id number]

## 2019-04-06 ENCOUNTER — Other Ambulatory Visit: Payer: Self-pay

## 2019-04-06 ENCOUNTER — Ambulatory Visit (INDEPENDENT_AMBULATORY_CARE_PROVIDER_SITE_OTHER): Payer: Medicare Other | Admitting: Podiatry

## 2019-04-06 DIAGNOSIS — E1142 Type 2 diabetes mellitus with diabetic polyneuropathy: Secondary | ICD-10-CM

## 2019-04-06 DIAGNOSIS — B351 Tinea unguium: Secondary | ICD-10-CM

## 2019-04-06 DIAGNOSIS — E119 Type 2 diabetes mellitus without complications: Secondary | ICD-10-CM | POA: Diagnosis not present

## 2019-04-06 DIAGNOSIS — E1121 Type 2 diabetes mellitus with diabetic nephropathy: Secondary | ICD-10-CM

## 2019-04-06 DIAGNOSIS — M79674 Pain in right toe(s): Secondary | ICD-10-CM

## 2019-04-06 DIAGNOSIS — M79675 Pain in left toe(s): Secondary | ICD-10-CM

## 2019-04-06 DIAGNOSIS — S90222A Contusion of left lesser toe(s) with damage to nail, initial encounter: Secondary | ICD-10-CM | POA: Diagnosis not present

## 2019-04-06 DIAGNOSIS — Z794 Long term (current) use of insulin: Secondary | ICD-10-CM

## 2019-04-06 DIAGNOSIS — L84 Corns and callosities: Secondary | ICD-10-CM

## 2019-04-06 NOTE — Patient Instructions (Addendum)
For subungual seroma left hallux, apply Neosporin Cream to left great toe once daily for one week. Leave open to air when sitting up in recliner.  For Neuropathy, you may apply Capsaicin Cream (over the counter). Biofreeze Gel may help as well. Discuss oral medications for neuropathy with Dr. Felipa Eth.    Diabetic Neuropathy Diabetic neuropathy refers to nerve damage that is caused by diabetes (diabetes mellitus). Over time, people with diabetes can develop nerve damage throughout the body. There are several types of diabetic neuropathy:  Peripheral neuropathy. This is the most common type of diabetic neuropathy. It causes damage to nerves that carry signals between the spinal cord and other parts of the body (peripheral nerves). This usually affects nerves in the feet and legs first, and may eventually affect the hands and arms. The damage affects the ability to sense touch or temperature.  Autonomic neuropathy. This type causes damage to nerves that control involuntary functions (autonomic nerves). These nerves carry signals that control: ? Heartbeat. ? Body temperature. ? Blood pressure. ? Urination. ? Digestion. ? Sweating. ? Sexual function. ? Response to changing blood sugar (glucose) levels.  Focal neuropathy. This type of nerve damage affects one area of the body, such as an arm, a leg, or the face. The injury may involve one nerve or a small group of nerves. Focal neuropathy can be painful and unpredictable, and occurs most often in older adults with diabetes. This often develops suddenly, but usually improves over time and does not cause long-term problems.  Proximal neuropathy. This type of nerve damage affects the nerves of the thighs, hips, buttocks, or legs. It causes severe pain, weakness, and muscle death (atrophy), usually in the thigh muscles. It is more common among older men and people who have type 2 diabetes. The length of recovery time may vary. What are the  causes? Peripheral, autonomic, and focal neuropathies are caused by diabetes that is not well controlled with treatment. The cause of proximal neuropathy is not known, but it may be caused by inflammation related to uncontrolled blood glucose levels. What are the signs or symptoms? Peripheral neuropathy Peripheral neuropathy develops slowly over time. When the nerves of the feet and legs no longer work, you may experience:  Burning, stabbing, or aching pain in the legs or feet.  Pain or cramping in the legs or feet.  Loss of feeling (numbness) and inability to feel pressure or pain in the feet. This can lead to: ? Thick calluses or sores on areas of constant pressure. ? Ulcers. ? Reduced ability to feel temperature changes.  Foot deformities.  Muscle weakness.  Loss of balance or coordination. Autonomic neuropathy The symptoms of autonomic neuropathy vary depending on which nerves are affected. Symptoms may include:  Problems with digestion, such as: ? Nausea or vomiting. ? Poor appetite. ? Bloating. ? Diarrhea or constipation. ? Trouble swallowing. ? Losing weight without trying to.  Problems with the heart, blood and lungs, such as: ? Dizziness, especially when standing up. ? Fainting. ? Shortness of breath. ? Irregular heartbeat.  Bladder problems, such as: ? Trouble starting or stopping urination. ? Leaking urine. ? Trouble emptying the bladder. ? Urinary tract infections (UTIs).  Problems with other body functions, such as: ? Sweat. You may sweat too much or too little. ? Temperature. You might get hot easily. Or, you might feel cold more than usual. ? Sexual function. Men may not be able to get or maintain an erection. Women may have vaginal dryness and  difficulty with arousal. Focal neuropathy Symptoms affect only one area of the body. Common symptoms include:  Numbness.  Tingling.  Burning pain.  Prickling feeling.  Very sensitive  skin.  Weakness.  Inability to move (paralysis).  Muscle twitching.  Muscles getting smaller (wasting).  Poor coordination.  Double or blurred vision. Proximal neuropathy  Sudden, severe pain in the hip, thigh, or buttocks. Pain may spread from the back into the legs (sciatica).  Pain and numbness in the arms and legs.  Tingling.  Loss of bladder control or bowel control.  Weakness and wasting of thigh muscles.  Difficulty getting up from a seated position.  Abdominal swelling.  Unexplained weight loss. How is this diagnosed? Diagnosis usually involves reviewing your medical history and any symptoms you have. Diagnosis varies depending on the type of neuropathy your health care provider suspects. Peripheral neuropathy Your health care provider will check areas that are affected by your nervous system (neurologic exam), such as your reflexes, how you move, and what you can feel. You may have other tests, such as:  Blood tests.  Removal and examination of fluid that surrounds the spinal cord (lumbar puncture).  CT scan.  MRI.  A test to check the nerves that control muscles (electromyogram, EMG).  Tests of how quickly messages pass through your nerves (nerve conduction velocity tests).  Removal of a small piece of nerve to be examined under a microscope (biopsy). Autonomic neuropathy You may have tests, such as:  Tests to measure your blood pressure and heart rate. This may include monitoring you while you are safely secured to an exam table that moves you from a lying position to an upright position (table tilt test).  Breathing tests to check your lungs.  Tests to check how food moves through the digestive system (gastric emptying tests).  Blood, sweat, or urine tests.  Ultrasound of your bladder.  Spinal fluid tests. Focal neuropathy This condition may be diagnosed with:  A neurologic exam.  CT scan.  MRI.  EMG.  Nerve conduction velocity  tests. Proximal neuropathy There is no test to diagnose this type of neuropathy. You may have tests to rule out other possible causes of this type of neuropathy. Tests may include:  X-rays of your spine and lumbar region.  Lumbar puncture.  MRI. How is this treated? The goal of treatment is to keep nerve damage from getting worse. The most important part of treatment is keeping your blood glucose level and your A1C level within your target range by following your diabetes management plan. Over time, maintaining lower blood glucose levels helps lessen symptoms. In some cases, you may need prescription pain medicine. Follow these instructions at home:  Lifestyle   Do not use any products that contain nicotine or tobacco, such as cigarettes and e-cigarettes. If you need help quitting, ask your health care provider.  Be physically active every day. Include strength training and balance exercises.  Follow a healthy meal plan.  Work with your health care provider to manage your blood pressure. General instructions  Follow your diabetes management plan as directed. ? Check your blood glucose levels as directed by your health care provider. ? Keep your blood glucose in your target range as directed by your health care provider. ? Have your A1C level checked at least two times a year, or as often as told by your health care provider.  Take over the counter and prescription medicines only as told by your health care provider. This includes insulin  and diabetes medicine.  Do not drive or use heavy machinery while taking prescription pain medicines.  Check your skin and feet every day for cuts, bruises, redness, blisters, or sores.  Keep all follow up visits as told by your health care provider. This is important. Contact a health care provider if:  You have burning, stabbing, or aching pain in your legs or feet.  You are unable to feel pressure or pain in your feet.  You develop  problems with digestion, such as: ? Nausea. ? Vomiting. ? Bloating. ? Constipation. ? Diarrhea. ? Abdominal pain.  You have difficulty with urination, such as inability: ? To control when you urinate (incontinence). ? To completely empty the bladder (retention).  You have palpitations.  You feel dizzy, weak, or faint when you stand up. Get help right away if:  You cannot urinate.  You have sudden weakness or loss of coordination.  You have trouble speaking.  You have pain or pressure in your chest.  You have an irregular heart beat.  You have sudden inability to move a part of your body. Summary  Diabetic neuropathy refers to nerve damage that is caused by diabetes. It can affect nerves throughout the entire body, causing numbness and pain in the arms, legs, digestive tract, heart, and other body systems.  Keep your blood glucose level and your blood pressure in your target range, as directed by your health care provider. This can help prevent neuropathy from getting worse.  Check your skin and feet every day for cuts, bruises, redness, blisters, or sores.  Do not use any products that contain nicotine or tobacco, such as cigarettes and e-cigarettes. If you need help quitting, ask your health care provider. This information is not intended to replace advice given to you by your health care provider. Make sure you discuss any questions you have with your health care provider. Document Revised: 02/16/2017 Document Reviewed: 02/09/2016 Elsevier Patient Education  [place].   Diabetes Mellitus and [place] care is an important part of your health, especially when you have diabetes. Diabetes may cause you to have problems because of poor blood flow (circulation) to your feet and legs, which can cause your skin to:  Become thinner and drier.  Break more easily.  Heal more slowly.  Peel and crack. You may also have nerve damage (neuropathy) in your legs  and feet, causing decreased feeling in them. This means that you may not notice minor injuries to your feet that could lead to more serious problems. Noticing and addressing any potential problems early is the best way to prevent future foot problems. How to care for your feet Foot hygiene  Wash your feet daily with warm water and mild soap. Do not use hot water. Then, pat your feet and the areas between your toes until they are completely dry. Do not soak your feet as this can dry your skin.  Trim your toenails straight across. Do not dig under them or around the cuticle. File the edges of your nails with an emery board or nail file.  Apply a moisturizing lotion or petroleum jelly to the skin on your feet and to dry, brittle toenails. Use lotion that does not contain alcohol and is unscented. Do not apply lotion between your toes. Shoes and socks  Wear clean socks or stockings every day. Make sure they are not too tight. Do not wear knee-high stockings since they may decrease blood flow to your legs.  Wear  shoes that fit properly and have enough cushioning. Always look in your shoes before you put them on to be sure there are no objects inside.  To break in new shoes, wear them for just a few hours a day. This prevents injuries on your feet. Wounds, scrapes, corns, and calluses  Check your feet daily for blisters, cuts, bruises, sores, and redness. If you cannot see the bottom of your feet, use a mirror or ask someone for help.  Do not cut corns or calluses or try to remove them with medicine.  If you find a minor scrape, cut, or break in the skin on your feet, keep it and the skin around it clean and dry. You may clean these areas with mild soap and water. Do not clean the area with peroxide, alcohol, or iodine.  If you have a wound, scrape, corn, or callus on your foot, look at it several times a day to make sure it is healing and not infected. Check for: ? Redness, swelling, or  pain. ? Fluid or blood. ? Warmth. ? Pus or a bad smell. General instructions  Do not cross your legs. This may decrease blood flow to your feet.  Do not use heating pads or hot water bottles on your feet. They may burn your skin. If you have lost feeling in your feet or legs, you may not know this is happening until it is too late.  Protect your feet from hot and cold by wearing shoes, such as at the beach or on hot pavement.  Schedule a complete foot exam at least once a year (annually) or more often if you have foot problems. If you have foot problems, report any cuts, sores, or bruises to your health care provider immediately. Contact a health care provider if:  You have a medical condition that increases your risk of infection and you have any cuts, sores, or bruises on your feet.  You have an injury that is not healing.  You have redness on your legs or feet.  You feel burning or tingling in your legs or feet.  You have pain or cramps in your legs and feet.  Your legs or feet are numb.  Your feet always feel cold.  You have pain around a toenail. Get help right away if:  You have a wound, scrape, corn, or callus on your foot and: ? You have pain, swelling, or redness that gets worse. ? You have fluid or blood coming from the wound, scrape, corn, or callus. ? Your wound, scrape, corn, or callus feels warm to the touch. ? You have pus or a bad smell coming from the wound, scrape, corn, or callus. ? You have a fever. ? You have a red line going up your leg. Summary  Check your feet every day for cuts, sores, red spots, swelling, and blisters.  Moisturize feet and legs daily.  Wear shoes that fit properly and have enough cushioning.  If you have foot problems, report any cuts, sores, or bruises to your health care provider immediately.  Schedule a complete foot exam at least once a year (annually) or more often if you have foot problems. This information is not  intended to replace advice given to you by your health care provider. Make sure you discuss any questions you have with your health care provider. Document Revised: 09/27/2018 Document Reviewed: 02/06/2016 Elsevier Patient Education  [place].

## 2019-04-12 ENCOUNTER — Encounter: Payer: Self-pay | Admitting: Podiatry

## 2019-04-12 NOTE — Progress Notes (Signed)
Subjective: John Romero presents today for follow up of preventative diabetic foot care and painful mycotic nails b/l that are difficult to trim. Pain interferes with ambulation. Aggravating factors include wearing enclosed shoe gear. Pain is relieved with periodic professional debridement.   His daughter is present during the visit.They are inquiring about treatment for his neuropathy symptoms which are not appearing to resolve.  They notice an improvement in lesions of right 2nd and right 4th digits with use of toe tunnels dispensed on last visit.  No Known Allergies   Objective: There were no vitals filed for this visit.  Pt 86 y.o. year old Caucasian male  in NAD. AAO x 3.   Vascular Examination:  Capillary fill time to digits <3 seconds b/l. Palpable DP pulses b/l. Palpable PT pulses b/l. Pedal hair absent b/l Skin temperature gradient within normal limits b/l.  Dermatological Examination: Pedal skin with normal turgor, texture and tone bilaterally. No interdigital macerations bilaterally. Toenails 1-5 b/l elongated, dystrophic, thickened, crumbly with subungual debris and tenderness to dorsal palpation. Subungual seroma noted left hallux with loose nail plate and serous drainage noted distally under nailplate.   Incurvated nailplate right great toe lateral border(s) with tenderness to palpation. No erythema, no edema, no drainage noted.  Musculoskeletal: Normal muscle strength 5/5 to all lower extremity muscle groups bilaterally, no pain crepitus or joint limitation noted with ROM b/l and hammertoes noted to the  R 2nd toe and R 4th toe.  Neurological: Protective sensation intact 5/5 intact bilaterally with 10g monofilament b/l.  Assessment: 1. Pain due to onychomycosis of toenails of both feet   2. Corns   3. Subungual contusion of toe of left foot, initial encounter   4. Type 2 diabetes mellitus with diabetic nephropathy, with long-term current use of insulin (John Romero)   5.  Diabetic peripheral neuropathy associated with type 2 diabetes mellitus (John Romero)   6. Encounter for diabetic foot exam (John Romero)    Plan: -Continue diabetic foot care principles. Literature dispensed on today.  -Toenails 1-5 b/l were debrided in length and girth with sterile nail nippers and dremel without iatrogenic bleeding. Offending nail border debrided and curretaged right hallux. Border cleansed with alcohol and triple antibiotic applied. No further treatment required by patient/caregiver. -Subungual seroma evacuted left hallux and light bleeding addressed with Lumicain. Povidine ointment and bandaid applied. Patient instructed to apply Neosporin Cream to digit once daily for one week. He may leave open to air when sitting up in recliner. Daughter is to call if he has any problems.  -For neuropathic pain, recommended Capsaicin Cream or Biofreeze Gel. Due to his kidney problems, I also advised them to discuss oral medications with PCP, Dr. Felipa Romero  -Patient to continue soft, supportive shoe gear daily. -Patient to report any pedal injuries to medical professional immediately. -Patient/POA to call should there be question/concern in the interim.  Return in about 3 months (around 07/07/2019).

## 2019-04-24 ENCOUNTER — Ambulatory Visit: Payer: Medicare Other | Attending: Internal Medicine

## 2019-04-24 DIAGNOSIS — Z23 Encounter for immunization: Secondary | ICD-10-CM

## 2019-04-24 NOTE — Progress Notes (Signed)
   Covid-19 Vaccination Clinic  Name:  John Romero    MRN: [id number] DOB: 1930-11-18  04/24/2019  [name] was observed post Covid-19 immunization for 15 minutes without incident. He was provided with Vaccine Information Sheet and instruction to access the V-Safe system.   [name] was instructed to call 911 with any severe reactions post vaccine: Marland Kitchen Difficulty breathing  . Swelling of face and throat  . A fast heartbeat  . A bad rash all over body  . Dizziness and weakness   Immunizations Administered    Name Date Dose VIS Date Route   Pfizer COVID-19 Vaccine 04/24/2019  1:00 PM 0.3 mL 12/29/2018 Intramuscular   Manufacturer: [place]   Lot: [id number]   [place]: [id number]

## 2019-04-25 DIAGNOSIS — J8410 Pulmonary fibrosis, unspecified: Secondary | ICD-10-CM | POA: Diagnosis not present

## 2019-04-25 DIAGNOSIS — D6869 Other thrombophilia: Secondary | ICD-10-CM | POA: Diagnosis not present

## 2019-04-25 DIAGNOSIS — I5032 Chronic diastolic (congestive) heart failure: Secondary | ICD-10-CM | POA: Diagnosis not present

## 2019-04-25 DIAGNOSIS — I700 Atherosclerosis of aorta: Secondary | ICD-10-CM | POA: Diagnosis not present

## 2019-04-25 DIAGNOSIS — Z794 Long term (current) use of insulin: Secondary | ICD-10-CM | POA: Diagnosis not present

## 2019-04-25 DIAGNOSIS — Z79899 Other long term (current) drug therapy: Secondary | ICD-10-CM | POA: Diagnosis not present

## 2019-04-25 DIAGNOSIS — I4891 Unspecified atrial fibrillation: Secondary | ICD-10-CM | POA: Diagnosis not present

## 2019-04-25 DIAGNOSIS — D692 Other nonthrombocytopenic purpura: Secondary | ICD-10-CM | POA: Diagnosis not present

## 2019-04-25 DIAGNOSIS — N1831 Chronic kidney disease, stage 3a: Secondary | ICD-10-CM | POA: Diagnosis not present

## 2019-04-25 DIAGNOSIS — E1121 Type 2 diabetes mellitus with diabetic nephropathy: Secondary | ICD-10-CM | POA: Diagnosis not present

## 2019-04-25 DIAGNOSIS — E1140 Type 2 diabetes mellitus with diabetic neuropathy, unspecified: Secondary | ICD-10-CM | POA: Diagnosis not present

## 2019-07-09 ENCOUNTER — Encounter: Payer: Self-pay | Admitting: Podiatry

## 2019-07-09 ENCOUNTER — Ambulatory Visit (INDEPENDENT_AMBULATORY_CARE_PROVIDER_SITE_OTHER): Payer: Medicare Other | Admitting: Podiatry

## 2019-07-09 ENCOUNTER — Other Ambulatory Visit: Payer: Self-pay

## 2019-07-09 DIAGNOSIS — E11621 Type 2 diabetes mellitus with foot ulcer: Secondary | ICD-10-CM

## 2019-07-09 DIAGNOSIS — Z794 Long term (current) use of insulin: Secondary | ICD-10-CM

## 2019-07-09 DIAGNOSIS — L89890 Pressure ulcer of other site, unstageable: Secondary | ICD-10-CM

## 2019-07-09 DIAGNOSIS — M79675 Pain in left toe(s): Secondary | ICD-10-CM

## 2019-07-09 DIAGNOSIS — E1121 Type 2 diabetes mellitus with diabetic nephropathy: Secondary | ICD-10-CM | POA: Diagnosis not present

## 2019-07-09 DIAGNOSIS — L97512 Non-pressure chronic ulcer of other part of right foot with fat layer exposed: Secondary | ICD-10-CM | POA: Diagnosis not present

## 2019-07-09 DIAGNOSIS — M79674 Pain in right toe(s): Secondary | ICD-10-CM | POA: Diagnosis not present

## 2019-07-09 DIAGNOSIS — B351 Tinea unguium: Secondary | ICD-10-CM | POA: Diagnosis not present

## 2019-07-09 NOTE — Patient Instructions (Addendum)
DRESSING CHANGES RIGHT FOOT:  Second and fifth right toes   1. KEEP RIGHT  FOOT DRY AT ALL TIMES!!!!  2. CLEANSE ULCER WITH SALINE.  3. DAB DRY WITH GAUZE SPONGE.  4. APPLY A LIGHT AMOUNT OF IODOSORB TO BASE OF ULCER.  5. APPLY OUTER DRESSING/BAND-AID AS INSTRUCTED.  6. DO NOT WALK BAREFOOT!!!  7.  IF YOU EXPERIENCE ANY FEVER, CHILLS, NIGHTSWEATS, NAUSEA OR VOMITING, ELEVATED OR LOW BLOOD SUGARS, REPORT TO EMERGENCY ROOM.  9. IF YOU EXPERIENCE INCREASED REDNESS, PAIN, SWELLING, DISCOLORATION, ODOR, PUS, DRAINAGE OR WARMTH OF YOUR FOOT, REPORT TO EMERGENCY ROOM.   Diabetes Mellitus and Foot Care Foot care is an important part of your health, especially when you have diabetes. Diabetes may cause you to have problems because of poor blood flow (circulation) to your feet and legs, which can cause your skin to:  Become thinner and drier.  Break more easily.  Heal more slowly.  Peel and crack. You may also have nerve damage (neuropathy) in your legs and feet, causing decreased feeling in them. This means that you may not notice minor injuries to your feet that could lead to more serious problems. Noticing and addressing any potential problems early is the best way to prevent future foot problems. How to care for your feet Foot hygiene  Wash your feet daily with warm water and mild soap. Do not use hot water. Then, pat your feet and the areas between your toes until they are completely dry. Do not soak your feet as this can dry your skin.  Trim your toenails straight across. Do not dig under them or around the cuticle. File the edges of your nails with an emery board or nail file.  Apply a moisturizing lotion or petroleum jelly to the skin on your feet and to dry, brittle toenails. Use lotion that does not contain alcohol and is unscented. Do not apply lotion between your toes. Shoes and socks  Wear clean socks or stockings every day. Make sure they are not too tight. Do  not wear knee-high stockings since they may decrease blood flow to your legs.  Wear shoes that fit properly and have enough cushioning. Always look in your shoes before you put them on to be sure there are no objects inside.  To break in new shoes, wear them for just a few hours a day. This prevents injuries on your feet. Wounds, scrapes, corns, and calluses  Check your feet daily for blisters, cuts, bruises, sores, and redness. If you cannot see the bottom of your feet, use a mirror or ask someone for help.  Do not cut corns or calluses or try to remove them with medicine.  If you find a minor scrape, cut, or break in the skin on your feet, keep it and the skin around it clean and dry. You may clean these areas with mild soap and water. Do not clean the area with peroxide, alcohol, or iodine.  If you have a wound, scrape, corn, or callus on your foot, look at it several times a day to make sure it is healing and not infected. Check for: ? Redness, swelling, or pain. ? Fluid or blood. ? Warmth. ? Pus or a bad smell. General instructions  Do not cross your legs. This may decrease blood flow to your feet.  Do not use heating pads or hot water bottles on your feet. They may burn your skin. If you have lost feeling in your  feet or legs, you may not know this is happening until it is too late.  Protect your feet from hot and cold by wearing shoes, such as at the beach or on hot pavement.  Schedule a complete foot exam at least once a year (annually) or more often if you have foot problems. If you have foot problems, report any cuts, sores, or bruises to your health care provider immediately. Contact a health care provider if:  You have a medical condition that increases your risk of infection and you have any cuts, sores, or bruises on your feet.  You have an injury that is not healing.  You have redness on your legs or feet.  You feel burning or tingling in your legs or feet.  You  have pain or cramps in your legs and feet.  Your legs or feet are numb.  Your feet always feel cold.  You have pain around a toenail. Get help right away if:  You have a wound, scrape, corn, or callus on your foot and: ? You have pain, swelling, or redness that gets worse. ? You have fluid or blood coming from the wound, scrape, corn, or callus. ? Your wound, scrape, corn, or callus feels warm to the touch. ? You have pus or a bad smell coming from the wound, scrape, corn, or callus. ? You have a fever. ? You have a red line going up your leg. Summary  Check your feet every day for cuts, sores, red spots, swelling, and blisters.  Moisturize feet and legs daily.  Wear shoes that fit properly and have enough cushioning.  If you have foot problems, report any cuts, sores, or bruises to your health care provider immediately.  Schedule a complete foot exam at least once a year (annually) or more often if you have foot problems. This information is not intended to replace advice given to you by your health care provider. Make sure you discuss any questions you have with your health care provider. Document Revised: 09/27/2018 Document Reviewed: 02/06/2016 Elsevier Patient Education  [place].

## 2019-07-09 NOTE — Progress Notes (Signed)
Subjective: Patient presents today with diabetes and cc of painful, discolored, thick toenails which interfere with daily activities. Pain is aggravated when wearing enclosed shoe gear. Pain is getting progressively worse and relieved with periodic professional debridement.  Patient has chronic painful corn right 2nd digit. He states he is having pain in his right 2nd digit and right 5th digit. Does not recall any trauma to area.  Daughter is present during today's visit. He continues to use large toe tunnel on right 2nd digit to keep it lifted up to prevent re-ulceration of digit.   John Manes, MD is his PCP. Last visit was 10/25/2018.  Current Outpatient Medications on File Prior to Visit  Medication Sig Dispense Refill  . allopurinol (ZYLOPRIM) 300 MG tablet TAKE 1/2 TABLET DAILY TO PREVENT GOUT ATTACK 90 tablet 1  . apixaban (ELIQUIS) 2.5 MG TABS tablet Take 1 tablet (2.5 mg total) by mouth 2 (two) times daily. 180 tablet 2  . aspirin EC 81 MG tablet Take 1 tablet (81 mg total) by mouth daily. 90 tablet 3  . carvedilol (COREG) 25 MG tablet TAKE 1 TABLET TWICE DAILY  WITH MEALS 180 tablet 2  . furosemide (LASIX) 40 MG tablet Take 40mg  in the morning and 20mg  in the evening. 45 tablet 6  . hydrALAZINE (APRESOLINE) 50 MG tablet TAKE 1 TABLET TWICE A DAY 180 tablet 3  . insulin aspart (NOVOLOG) 100 UNIT/ML injection Inject into the skin as directed. 12 UNITS AM, 8 UNITS LUNCH, AND 12 UNITS PM ALL BEFORE MEALS    . Insulin Detemir (LEVEMIR) 100 UNIT/ML Pen Inject 24 Units into the skin daily.     . Multiple Vitamin (MULTIVITAMIN) tablet Take 1 tablet by mouth daily.    . mupirocin ointment (BACTROBAN) 2 % APPLY TOPICALLY TO BIOPSY SITE ONCE D UTD    . Respiratory Therapy Supplies (FLUTTER) DEVI Use as directed 1 each 0  . simvastatin (ZOCOR) 20 MG tablet Take 20 mg by mouth daily.    Marland Kitchen spironolactone (ALDACTONE) 25 MG tablet Take 25 mg by mouth daily.    Marland Kitchen tobramycin-dexamethasone  (TOBRADEX) ophthalmic solution SHAKE LQ AND INT 1 GTT IN OU QID     No current facility-administered medications on file prior to visit.     No Known Allergies   Objective: There were no vitals filed for this visit.  John Romero is a/an 86 y.o. male WD, WN in NAD.Marland Kitchen AAO X 3.  Vascular Examination: Capillary fill time to digits <3 seconds b/l lower extremities. Palpable pedal pulses b/l LE. Pedal hair absent. Lower extremity skin temperature gradient within normal limits.  Dermatological Examination: Pedal skin with normal turgor, texture and tone bilaterally. No open wounds bilaterally. No interdigital macerations bilaterally. Toenails 1-5 b/l elongated, discolored, dystrophic, thickened, crumbly with subungual debris and tenderness to dorsal palpation.  No images are attached to the encounter.  Wound Location: R 2nd toe There is a minimal amount of devitalized tissue present in the wound. Predebridement Wound Measurement:  0.5  x 0.5 cm. Postdebridement Wound Measurement: 0.5 x 1.0 x 0.2 cm. Wound Base: Mixed Granular/Fibrotic Peri-wound: Normal Exudate: None: wound tissue dry Blood Loss during debridement: <1 cc('s). Description of tissue removed from ulceration today: devitalized hyperkeratosis. Sign(s) of clinical bacterial infection: no clinical signs of infection noted on examination today. Material in wound which inhibits healing/promotes adjacent tissue breakdown: None.   He also appears to have a pressure wound noted on the medial aspect of the right fifth digit adjacent  to the nail plate.  There is dried serous drainage.  There is tenderness to palpation.  Musculoskeletal Examination: Normal muscle strength 5/5 to all lower extremity muscle groups bilaterally. No pain crepitus or joint limitation noted with ROM b/l. Hammertoes noted to the R 2nd toe and R 4th toe.   The toe tunnel for the right second digit appears to be causing pressure to form between digits 3 through  5 on the right foot, contributing to pressure injury of right fifth digit.  Neurological Examination: Protective sensation intact 5/5 intact bilaterally with 10g monofilament b/l.  Assessment: 1. Pain due to onychomycosis of toenails of both feet   2. Diabetic ulcer of toe of right foot associated with type 2 diabetes mellitus, with fat layer exposed ([place])   3. Pressure injury of toe of right foot, unstageable ([place])   4. Type 2 diabetes mellitus with diabetic nephropathy, with long-term current use of insulin ([place])    Plan: -Patient was evaluated and treated and all questions answered.  -Patient/POA/Family member educated on diagnosis and treatment plan of routine ulcer debridement/wound care.  -Ulceration debridement achieved utilizing sharp debridement with sterile scalpel blade.. Type/amount of devitalized tissue removed:hyperkeratosis.  -Today's ulcer size post-debridement: 0.5 x 1.0 x 0.2 cm. -Ulceration cleansed with wound cleanser. Iodosorb Gel applied to base of ulceration and secured with light dressing. -Wound responded well to today's debridement. -Patient risk factors affecting healing of ulcer: -Frequency of debridement needed to achieve healing: 2-3 weeks. John Romero given written instructions on daily wound care for R 2nd toe ulceration and right 5th digit pressure injury. -Toenails 1-5 b/l were debrided in length and girth with sterile nail nippers and dremel without iatrogenic bleeding.  -Patient to place toe separators between 3rd, 4th and 5th digits right foot. -Patient to report any pedal injuries to medical professional immediately. -Patient instructed to report to emergency department with worsening appearance of ulcer/toe/foot, increased pain, foul odor, increased redness, swelling, drainage, fever, chills, nightsweats, nausea, vomiting, increased blood sugar.  -Dispensed toe separator's to be applied between right third, right fourth, and right fifth digits.  He is  to apply it every morning and remove every evening.  He is to remove the toe tunnel before bedtime. -Patient to continue soft, supportive shoe gear daily. -Patient/POA to call should there be question/concern in the interim.  Return in about 3 weeks (around 07/30/2019) for  , diabetic ulcer righttoes. Marzetta Board, DPM

## 2019-07-10 ENCOUNTER — Telehealth: Payer: Self-pay | Admitting: Cardiovascular Disease

## 2019-07-10 NOTE — Telephone Encounter (Signed)
Patient already on low dose Eliquis. Noted hx of TIA.  Please HOLD next dose of eliquis.  Recommend to visit ER for further assessment d/t low blood pressure and active bleeding.  Patient will need CBC and CMP as soon as possible.

## 2019-07-10 NOTE — Telephone Encounter (Signed)
Returned call to daughter [name]. She reports patient had a smear of blood in the bed last night and had diarrhea that "filled the toilet full of blood". She reports patient has no known GI issues - no hemorrhoids, gastritis. Patient is on Eliquis 2.5mg  BID. [name] has been trying to get patient in for a colonoscopy for a while but cannot get him evaluated until Sept. [name] reports BP was 79/65 last week - maybe dehydrated - BP improved with hydration. BP today is 100/49. Patient does not have any weakness today.    Advised that patient contact PCP also to see if additional work up is needed - stool sample, CBC  Will send to CV[facility]/MD to review and advise MD does have PM openings today if it is recommended for patient to be seen

## 2019-07-10 NOTE — Telephone Encounter (Signed)
Daughter advised of [facility] recommendations. She prefers to take patient to [facility]. I called and talked with EDP. They only have 2 U PRBCs and no GI specialist to consult. He advised [facility] or [facility] ED for evaluation of hypotension/active bleeding. Relayed this information to daughter who was appreciative of assistance.

## 2019-07-10 NOTE — Telephone Encounter (Signed)
Patient's daughter is calling because patient has been experiencing rectal bleeding this morning. She states he has not experienced any pain or additional symptoms. However, she assumes blood thinner may need to be adjusted. Please call to discuss.

## 2019-07-11 ENCOUNTER — Encounter: Payer: Self-pay | Admitting: Physician Assistant

## 2019-08-01 ENCOUNTER — Ambulatory Visit: Payer: Medicare Other | Admitting: Podiatry

## 2019-08-01 DIAGNOSIS — R9431 Abnormal electrocardiogram [ECG] [EKG]: Secondary | ICD-10-CM | POA: Diagnosis not present

## 2019-08-01 DIAGNOSIS — I4891 Unspecified atrial fibrillation: Secondary | ICD-10-CM | POA: Diagnosis not present

## 2019-08-01 DIAGNOSIS — R531 Weakness: Secondary | ICD-10-CM | POA: Diagnosis not present

## 2019-08-01 DIAGNOSIS — I6602 Occlusion and stenosis of left middle cerebral artery: Secondary | ICD-10-CM | POA: Diagnosis not present

## 2019-08-01 DIAGNOSIS — I639 Cerebral infarction, unspecified: Secondary | ICD-10-CM | POA: Diagnosis not present

## 2019-08-01 DIAGNOSIS — I6523 Occlusion and stenosis of bilateral carotid arteries: Secondary | ICD-10-CM | POA: Diagnosis not present

## 2019-08-01 DIAGNOSIS — R1310 Dysphagia, unspecified: Secondary | ICD-10-CM | POA: Diagnosis not present

## 2019-08-01 DIAGNOSIS — E1165 Type 2 diabetes mellitus with hyperglycemia: Secondary | ICD-10-CM | POA: Diagnosis not present

## 2019-08-01 DIAGNOSIS — R404 Transient alteration of awareness: Secondary | ICD-10-CM | POA: Diagnosis not present

## 2019-08-01 DIAGNOSIS — R130 Aphagia: Secondary | ICD-10-CM | POA: Diagnosis not present

## 2019-08-01 DIAGNOSIS — R456 Violent behavior: Secondary | ICD-10-CM | POA: Diagnosis not present

## 2019-08-02 DIAGNOSIS — R29810 Facial weakness: Secondary | ICD-10-CM | POA: Diagnosis present

## 2019-08-02 DIAGNOSIS — I69320 Aphasia following cerebral infarction: Secondary | ICD-10-CM | POA: Diagnosis not present

## 2019-08-02 DIAGNOSIS — I2510 Atherosclerotic heart disease of native coronary artery without angina pectoris: Secondary | ICD-10-CM | POA: Diagnosis present

## 2019-08-02 DIAGNOSIS — R29717 NIHSS score 17: Secondary | ICD-10-CM | POA: Diagnosis not present

## 2019-08-02 DIAGNOSIS — E119 Type 2 diabetes mellitus without complications: Secondary | ICD-10-CM | POA: Diagnosis not present

## 2019-08-02 DIAGNOSIS — I4891 Unspecified atrial fibrillation: Secondary | ICD-10-CM | POA: Diagnosis present

## 2019-08-02 DIAGNOSIS — L89152 Pressure ulcer of sacral region, stage 2: Secondary | ICD-10-CM | POA: Diagnosis not present

## 2019-08-02 DIAGNOSIS — R471 Dysarthria and anarthria: Secondary | ICD-10-CM | POA: Diagnosis present

## 2019-08-02 DIAGNOSIS — Z4659 Encounter for fitting and adjustment of other gastrointestinal appliance and device: Secondary | ICD-10-CM | POA: Diagnosis not present

## 2019-08-02 DIAGNOSIS — I878 Other specified disorders of veins: Secondary | ICD-10-CM | POA: Diagnosis not present

## 2019-08-02 DIAGNOSIS — E1165 Type 2 diabetes mellitus with hyperglycemia: Secondary | ICD-10-CM | POA: Diagnosis present

## 2019-08-02 DIAGNOSIS — E11319 Type 2 diabetes mellitus with unspecified diabetic retinopathy without macular edema: Secondary | ICD-10-CM | POA: Diagnosis present

## 2019-08-02 DIAGNOSIS — K922 Gastrointestinal hemorrhage, unspecified: Secondary | ICD-10-CM | POA: Diagnosis not present

## 2019-08-02 DIAGNOSIS — N183 Chronic kidney disease, stage 3 unspecified: Secondary | ICD-10-CM | POA: Diagnosis not present

## 2019-08-02 DIAGNOSIS — R32 Unspecified urinary incontinence: Secondary | ICD-10-CM | POA: Diagnosis not present

## 2019-08-02 DIAGNOSIS — Z515 Encounter for palliative care: Secondary | ICD-10-CM | POA: Diagnosis not present

## 2019-08-02 DIAGNOSIS — N1832 Chronic kidney disease, stage 3b: Secondary | ICD-10-CM | POA: Diagnosis present

## 2019-08-02 DIAGNOSIS — R1319 Other dysphagia: Secondary | ICD-10-CM | POA: Diagnosis present

## 2019-08-02 DIAGNOSIS — I63512 Cerebral infarction due to unspecified occlusion or stenosis of left middle cerebral artery: Secondary | ICD-10-CM | POA: Diagnosis not present

## 2019-08-02 DIAGNOSIS — Z66 Do not resuscitate: Secondary | ICD-10-CM | POA: Diagnosis not present

## 2019-08-02 DIAGNOSIS — I69354 Hemiplegia and hemiparesis following cerebral infarction affecting left non-dominant side: Secondary | ICD-10-CM | POA: Diagnosis not present

## 2019-08-02 DIAGNOSIS — I129 Hypertensive chronic kidney disease with stage 1 through stage 4 chronic kidney disease, or unspecified chronic kidney disease: Secondary | ICD-10-CM | POA: Diagnosis not present

## 2019-08-02 DIAGNOSIS — E1140 Type 2 diabetes mellitus with diabetic neuropathy, unspecified: Secondary | ICD-10-CM | POA: Diagnosis present

## 2019-08-02 DIAGNOSIS — I10 Essential (primary) hypertension: Secondary | ICD-10-CM | POA: Diagnosis not present

## 2019-08-02 DIAGNOSIS — E785 Hyperlipidemia, unspecified: Secondary | ICD-10-CM | POA: Diagnosis not present

## 2019-08-02 DIAGNOSIS — I69318 Other symptoms and signs involving cognitive functions following cerebral infarction: Secondary | ICD-10-CM | POA: Diagnosis not present

## 2019-08-02 DIAGNOSIS — Z7901 Long term (current) use of anticoagulants: Secondary | ICD-10-CM | POA: Diagnosis not present

## 2019-08-02 DIAGNOSIS — Z720 Tobacco use: Secondary | ICD-10-CM | POA: Diagnosis not present

## 2019-08-02 DIAGNOSIS — I69391 Dysphagia following cerebral infarction: Secondary | ICD-10-CM | POA: Diagnosis not present

## 2019-08-02 DIAGNOSIS — I872 Venous insufficiency (chronic) (peripheral): Secondary | ICD-10-CM | POA: Diagnosis present

## 2019-08-02 DIAGNOSIS — R4701 Aphasia: Secondary | ICD-10-CM | POA: Diagnosis present

## 2019-08-02 DIAGNOSIS — F17220 Nicotine dependence, chewing tobacco, uncomplicated: Secondary | ICD-10-CM | POA: Diagnosis present

## 2019-08-02 DIAGNOSIS — I491 Atrial premature depolarization: Secondary | ICD-10-CM | POA: Diagnosis not present

## 2019-08-02 DIAGNOSIS — Z951 Presence of aortocoronary bypass graft: Secondary | ICD-10-CM | POA: Diagnosis not present

## 2019-08-02 DIAGNOSIS — Z8719 Personal history of other diseases of the digestive system: Secondary | ICD-10-CM | POA: Diagnosis not present

## 2019-08-02 DIAGNOSIS — I517 Cardiomegaly: Secondary | ICD-10-CM | POA: Diagnosis not present

## 2019-08-02 DIAGNOSIS — I252 Old myocardial infarction: Secondary | ICD-10-CM | POA: Diagnosis not present

## 2019-08-02 DIAGNOSIS — R29720 NIHSS score 20: Secondary | ICD-10-CM | POA: Diagnosis present

## 2019-08-02 DIAGNOSIS — I080 Rheumatic disorders of both mitral and aortic valves: Secondary | ICD-10-CM | POA: Diagnosis not present

## 2019-08-02 DIAGNOSIS — I63412 Cerebral infarction due to embolism of left middle cerebral artery: Secondary | ICD-10-CM | POA: Diagnosis present

## 2019-08-02 DIAGNOSIS — I69351 Hemiplegia and hemiparesis following cerebral infarction affecting right dominant side: Secondary | ICD-10-CM | POA: Diagnosis not present

## 2019-08-02 DIAGNOSIS — R159 Full incontinence of feces: Secondary | ICD-10-CM | POA: Diagnosis not present

## 2019-08-02 DIAGNOSIS — I639 Cerebral infarction, unspecified: Secondary | ICD-10-CM | POA: Diagnosis not present

## 2019-08-02 DIAGNOSIS — E1122 Type 2 diabetes mellitus with diabetic chronic kidney disease: Secondary | ICD-10-CM | POA: Diagnosis present

## 2019-08-02 DIAGNOSIS — I5032 Chronic diastolic (congestive) heart failure: Secondary | ICD-10-CM | POA: Diagnosis present

## 2019-08-02 DIAGNOSIS — Z794 Long term (current) use of insulin: Secondary | ICD-10-CM | POA: Diagnosis not present

## 2019-08-02 DIAGNOSIS — M109 Gout, unspecified: Secondary | ICD-10-CM | POA: Diagnosis present

## 2019-08-02 DIAGNOSIS — I130 Hypertensive heart and chronic kidney disease with heart failure and stage 1 through stage 4 chronic kidney disease, or unspecified chronic kidney disease: Secondary | ICD-10-CM | POA: Diagnosis present

## 2019-08-16 ENCOUNTER — Ambulatory Visit: Payer: Medicare Other | Admitting: Physician Assistant

## 2019-08-19 DEATH — deceased
